# Patient Record
Sex: Male | Born: 1970 | Race: White | Hispanic: No | Marital: Married | Smoking: Former smoker
Health system: Southern US, Community
[De-identification: ages and names within clinical notes are randomized; demographics above are authoritative.]

## PROBLEM LIST (undated history)

## (undated) DIAGNOSIS — I10 Essential (primary) hypertension: Secondary | ICD-10-CM

## (undated) DIAGNOSIS — K219 Gastro-esophageal reflux disease without esophagitis: Secondary | ICD-10-CM

## (undated) DIAGNOSIS — Z9889 Other specified postprocedural states: Secondary | ICD-10-CM

## (undated) DIAGNOSIS — R7989 Other specified abnormal findings of blood chemistry: Secondary | ICD-10-CM

## (undated) DIAGNOSIS — T4145XA Adverse effect of unspecified anesthetic, initial encounter: Secondary | ICD-10-CM

## (undated) DIAGNOSIS — M1990 Unspecified osteoarthritis, unspecified site: Secondary | ICD-10-CM

## (undated) DIAGNOSIS — R945 Abnormal results of liver function studies: Secondary | ICD-10-CM

## (undated) DIAGNOSIS — N132 Hydronephrosis with renal and ureteral calculous obstruction: Secondary | ICD-10-CM

## (undated) DIAGNOSIS — G510 Bell's palsy: Secondary | ICD-10-CM

## (undated) DIAGNOSIS — J069 Acute upper respiratory infection, unspecified: Secondary | ICD-10-CM

## (undated) DIAGNOSIS — D496 Neoplasm of unspecified behavior of brain: Secondary | ICD-10-CM

## (undated) DIAGNOSIS — N201 Calculus of ureter: Secondary | ICD-10-CM

## (undated) DIAGNOSIS — N200 Calculus of kidney: Secondary | ICD-10-CM

## (undated) DIAGNOSIS — R112 Nausea with vomiting, unspecified: Secondary | ICD-10-CM

## (undated) HISTORY — PX: BRAIN TUMOR EXCISION: SHX577

## (undated) HISTORY — DX: Acute upper respiratory infection, unspecified: J06.9

## (undated) HISTORY — DX: Calculus of ureter: N20.1

## (undated) HISTORY — PX: FRACTURE SURGERY: SHX138

## (undated) HISTORY — DX: Hydronephrosis with renal and ureteral calculous obstruction: N13.2

## (undated) HISTORY — DX: Essential (primary) hypertension: I10

---
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
## 1996-10-22 HISTORY — PX: ELBOW FRACTURE SURGERY: SHX616

## 2006-10-22 DIAGNOSIS — T8859XA Other complications of anesthesia, initial encounter: Secondary | ICD-10-CM

## 2006-10-22 DIAGNOSIS — G510 Bell's palsy: Secondary | ICD-10-CM

## 2006-10-22 HISTORY — DX: Other complications of anesthesia, initial encounter: T88.59XA

## 2006-10-22 HISTORY — DX: Bell's palsy: G51.0

## 2006-10-22 HISTORY — PX: BRAIN TUMOR EXCISION: SHX577

## 2009-12-04 ENCOUNTER — Ambulatory Visit: Payer: Self-pay | Admitting: Family Medicine

## 2011-01-31 ENCOUNTER — Ambulatory Visit: Payer: Self-pay | Admitting: Family Medicine

## 2013-06-10 DIAGNOSIS — I10 Essential (primary) hypertension: Secondary | ICD-10-CM | POA: Insufficient documentation

## 2014-09-10 ENCOUNTER — Ambulatory Visit: Payer: Self-pay | Admitting: Family Medicine

## 2015-01-26 DIAGNOSIS — I10 Essential (primary) hypertension: Secondary | ICD-10-CM

## 2015-01-26 DIAGNOSIS — E785 Hyperlipidemia, unspecified: Secondary | ICD-10-CM | POA: Insufficient documentation

## 2015-01-26 HISTORY — DX: Essential (primary) hypertension: I10

## 2015-04-22 DIAGNOSIS — J069 Acute upper respiratory infection, unspecified: Secondary | ICD-10-CM | POA: Insufficient documentation

## 2015-04-22 HISTORY — DX: Acute upper respiratory infection, unspecified: J06.9

## 2015-06-20 ENCOUNTER — Emergency Department (HOSPITAL_COMMUNITY): Payer: 59

## 2015-06-20 ENCOUNTER — Emergency Department: Payer: 59

## 2015-06-20 ENCOUNTER — Encounter: Payer: Self-pay | Admitting: Emergency Medicine

## 2015-06-20 ENCOUNTER — Emergency Department
Admission: EM | Admit: 2015-06-20 | Discharge: 2015-06-20 | Disposition: A | Discharge status: Home / Self Care | Payer: 59 | Attending: Emergency Medicine | Admitting: Emergency Medicine

## 2015-06-20 DIAGNOSIS — Z720 Tobacco use: Secondary | ICD-10-CM | POA: Diagnosis not present

## 2015-06-20 DIAGNOSIS — I10 Essential (primary) hypertension: Secondary | ICD-10-CM | POA: Diagnosis not present

## 2015-06-20 DIAGNOSIS — R319 Hematuria, unspecified: Secondary | ICD-10-CM | POA: Diagnosis present

## 2015-06-20 DIAGNOSIS — R109 Unspecified abdominal pain: Secondary | ICD-10-CM

## 2015-06-20 DIAGNOSIS — N200 Calculus of kidney: Secondary | ICD-10-CM | POA: Insufficient documentation

## 2015-06-20 HISTORY — DX: Essential (primary) hypertension: I10

## 2015-06-20 HISTORY — DX: Neoplasm of unspecified behavior of brain: D49.6

## 2015-06-20 HISTORY — DX: Calculus of kidney: N20.0

## 2015-06-20 LAB — CBC WITH DIFFERENTIAL/PLATELET
Basophils Absolute: 0.1 10*3/uL (ref 0–0.1)
Basophils Relative: 1 %
EOS PCT: 3 %
Eosinophils Absolute: 0.3 10*3/uL (ref 0–0.7)
HCT: 41.9 % (ref 40.0–52.0)
Hemoglobin: 14 g/dL (ref 13.0–18.0)
LYMPHS ABS: 3.8 10*3/uL — AB (ref 1.0–3.6)
LYMPHS PCT: 38 %
MCH: 30.1 pg (ref 26.0–34.0)
MCHC: 33.4 g/dL (ref 32.0–36.0)
MCV: 90.1 fL (ref 80.0–100.0)
Monocytes Absolute: 0.7 10*3/uL (ref 0.2–1.0)
Monocytes Relative: 7 %
Neutro Abs: 5.2 10*3/uL (ref 1.4–6.5)
Neutrophils Relative %: 51 %
Platelets: 245 10*3/uL (ref 150–440)
RBC: 4.64 MIL/uL (ref 4.40–5.90)
RDW: 14.3 % (ref 11.5–14.5)
WBC: 10.1 10*3/uL (ref 3.8–10.6)

## 2015-06-20 LAB — BASIC METABOLIC PANEL
Anion gap: 7 (ref 5–15)
BUN: 16 mg/dL (ref 6–20)
CALCIUM: 9.2 mg/dL (ref 8.9–10.3)
CO2: 26 mmol/L (ref 22–32)
CREATININE: 0.92 mg/dL (ref 0.61–1.24)
Chloride: 105 mmol/L (ref 101–111)
GFR calc Af Amer: >60 mL/min (ref >60)
GLUCOSE: 90 mg/dL (ref 65–99)
Potassium: 4.1 mmol/L (ref 3.5–5.1)
Sodium: 138 mmol/L (ref 135–145)

## 2015-06-20 LAB — URINALYSIS COMPLETE WITH MICROSCOPIC (ARMC ONLY)
Bilirubin Urine: NEGATIVE
GLUCOSE, UA: NEGATIVE mg/dL
Nitrite: NEGATIVE
PROTEIN: 30 mg/dL — AB
Specific Gravity, Urine: 1.027 (ref 1.005–1.030)
Squamous Epithelial / LPF: NONE SEEN
pH: 6 (ref 5.0–8.0)

## 2015-06-20 MED ORDER — ONDANSETRON HCL 4 MG PO TABS: 4.0000 mg | ORAL_TABLET | Freq: Every day | ORAL | Status: DC | PRN

## 2015-06-20 MED ORDER — TAMSULOSIN HCL 0.4 MG PO CAPS: 0.4000 mg | ORAL_CAPSULE | Freq: Every day | ORAL | Status: DC

## 2015-06-20 MED ORDER — OXYCODONE-ACETAMINOPHEN 5-325 MG PO TABS: 1.0000 | ORAL_TABLET | ORAL | Status: DC | PRN

## 2015-06-20 NOTE — ED Provider Notes (Signed)
[facility] Provider Note   ____________________________________________  Time seen: 1425  I have reviewed the triage vital signs and the nursing notes.   HISTORY  Chief Complaint Hematuria and Flank Pain   History limited by: Not Limited   HPI Aaron Nichols is a 44 y.o. male who presents to the emergency department today because of concerns for left flank pain and bloody urine. The patient states that he first started having his flank pain last night. He describes it as being in the left flank. Some radiation into the back. He states that it is roughly 3 or 4 out of 10. He states that this morning he noticed blood in his urine. He states he does have a history of kidney stones. He has never required lithotripsy. Last kidney stone roughly 1 year ago. Denies any fevers, nausea or vomiting.     Past Medical History  Diagnosis Date  . Brain tumor   . Kidney stone   . Hypertension     There are no active problems to display for this patient.   Past Surgical History  Procedure Laterality Date  . Brain tumor excision      No current outpatient prescriptions on file.  Allergies Review of patient's allergies indicates no known allergies.  No family history on file.  Social History Social History  Substance Use Topics  . Smoking status: Current Every Day Smoker  . Smokeless tobacco: None  . Alcohol Use: No    Review of Systems  Constitutional: Negative for fever. Cardiovascular: Negative for chest pain. Respiratory: Negative for shortness of breath. Gastrointestinal: Positive for left-sided flank pain Genitourinary: Positive for hematuria Musculoskeletal: Negative for back pain. Skin: Negative for rash. Neurological: Negative for headaches, focal weakness or numbness.   10-point ROS otherwise negative.  ____________________________________________   PHYSICAL EXAM:  VITAL SIGNS: ED Triage Vitals  Enc  Vitals Group     BP 06/20/15 1253 155/80 mmHg     Pulse Rate 06/20/15 1253 79     Resp 06/20/15 1253 18     Temp 06/20/15 1253 98.2 F (36.8 C)     Temp Source 06/20/15 1253 Oral     SpO2 06/20/15 1253 93 %     Weight 06/20/15 1253 225 lb (102.059 kg)     Height 06/20/15 1253 5' 10.5" (1.791 m)     Head Cir --      Peak Flow --      Pain Score 06/20/15 1254 4   Constitutional: Alert and oriented. Well appearing and in no distress. Eyes: Conjunctivae are normal. PERRL. Normal extraocular movements. ENT   Head: Normocephalic and atraumatic.   Nose: No congestion/rhinnorhea.   Mouth/Throat: Mucous membranes are moist.   Neck: No stridor. Hematological/Lymphatic/Immunilogical: No cervical lymphadenopathy. Cardiovascular: Normal rate, regular rhythm.  No murmurs, rubs, or gallops. Respiratory: Normal respiratory effort without tachypnea nor retractions. Breath sounds are clear and equal bilaterally. No wheezes/rales/rhonchi. Gastrointestinal: Soft and nontender. No distention. There is no CVA tenderness. Genitourinary: Deferred Musculoskeletal: Normal range of motion in all extremities. No joint effusions.  No lower extremity tenderness nor edema. Neurologic:  Normal speech and language. No gross focal neurologic deficits are appreciated. Speech is normal.  Skin:  Skin is warm, dry and intact. No rash noted. Psychiatric: Mood and affect are normal. Speech and behavior are normal. Patient exhibits appropriate insight and judgment.  ____________________________________________    LABS (pertinent positives/negatives)  Labs Reviewed  URINALYSIS COMPLETEWITH MICROSCOPIC ([place] ONLY) - Abnormal;  Notable for the following:    Color, Urine AMBER (*)    APPearance HAZY (*)    Ketones, ur TRACE (*)    Hgb urine dipstick 3+ (*)    Protein, ur 30 (*)    Leukocytes, UA TRACE (*)    Bacteria, UA RARE (*)    All other components within normal limits  CBC WITH  DIFFERENTIAL/PLATELET - Abnormal; Notable for the following:    Lymphs Abs 3.8 (*)    All other components within normal limits  BASIC METABOLIC PANEL     ____________________________________________   EKG  None  ____________________________________________    RADIOLOGY  Renal ultrasound IMPRESSION: There is no hydronephrosis. Nonobstructing stone or stones are present in the lower pole of the left kidney. No acute bladder abnormality is observed. ____________________________________________   PROCEDURES  Procedure(s) performed: None  Critical Care performed: No  ____________________________________________   INITIAL IMPRESSION / ASSESSMENT AND PLAN / ED COURSE  Pertinent labs & imaging results that were available during my care of the patient were reviewed by me and considered in my medical decision making (see chart for details).  Patient presented to the emergency department today with concerns for left flank pain. Patient's urine with multiple red blood cells. Ultrasound within normal limits. Blood work without any concerning findings. Will treat for kidney stone.  ____________________________________________   FINAL CLINICAL IMPRESSION(S) / ED DIAGNOSES  Final diagnoses:  Kidney stone     Nance Pear, MD 06/20/15 [phone]

## 2015-06-20 NOTE — ED Notes (Signed)
Patient to ER for c/o hematuria that started this am with flank pain on left side. Has h/o kidney stones.

## 2015-06-20 NOTE — Discharge Instructions (Signed)
Please seek medical attention for any high fevers, chest pain, shortness of breath, change in behavior, persistent vomiting, bloody stool or any other new or concerning symptoms. ° ° °Kidney Stones °Kidney stones (urolithiasis) are deposits that form inside your kidneys. The intense pain is caused by the stone moving through the urinary tract. When the stone moves, the ureter goes into spasm around the stone. The stone is usually passed in the urine.  °CAUSES  °· A disorder that makes certain neck glands produce too much parathyroid hormone (primary hyperparathyroidism). °· A buildup of uric acid crystals, similar to gout in your joints. °· Narrowing (stricture) of the ureter. °· A kidney obstruction present at birth (congenital obstruction). °· Previous surgery on the kidney or ureters. °· Numerous kidney infections. °SYMPTOMS  °· Feeling sick to your stomach (nauseous). °· Throwing up (vomiting). °· Blood in the urine (hematuria). °· Pain that usually spreads (radiates) to the groin. °· Frequency or urgency of urination. °DIAGNOSIS  °· Taking a history and physical exam. °· Blood or urine tests. °· CT scan. °· Occasionally, an examination of the inside of the urinary bladder (cystoscopy) is performed. °TREATMENT  °· Observation. °· Increasing your fluid intake. °· Extracorporeal shock wave lithotripsy--This is a noninvasive procedure that uses shock waves to break up kidney stones. °· Surgery may be needed if you have severe pain or persistent obstruction. There are various surgical procedures. Most of the procedures are performed with the use of small instruments. Only small incisions are needed to accommodate these instruments, so recovery time is minimized. °The size, location, and chemical composition are all important variables that will determine the proper choice of action for you. Talk to your health care provider to better understand your situation so that you will minimize the risk of injury to yourself  and your kidney.  °HOME CARE INSTRUCTIONS  °· Drink enough water and fluids to keep your urine clear or pale yellow. This will help you to pass the stone or stone fragments. °· Strain all urine through the provided strainer. Keep all particulate matter and stones for your health care provider to see. The stone causing the pain may be as small as a grain of salt. It is very important to use the strainer each and every time you pass your urine. The collection of your stone will allow your health care provider to analyze it and verify that a stone has actually passed. The stone analysis will often identify what you can do to reduce the incidence of recurrences. °· Only take over-the-counter or prescription medicines for pain, discomfort, or fever as directed by your health care provider. °· Make a follow-up appointment with your health care provider as directed. °· Get follow-up X-rays if required. The absence of pain does not always mean that the stone has passed. It may have only stopped moving. If the urine remains completely obstructed, it can cause loss of kidney function or even complete destruction of the kidney. It is your responsibility to make sure X-rays and follow-ups are completed. Ultrasounds of the kidney can show blockages and the status of the kidney. Ultrasounds are not associated with any radiation and can be performed easily in a matter of minutes. °SEEK MEDICAL CARE IF: °· You experience pain that is progressive and unresponsive to any pain medicine you have been prescribed. °SEEK IMMEDIATE MEDICAL CARE IF:  °· Pain cannot be controlled with the prescribed medicine. °· You have a fever or shaking chills. °· The severity or intensity   of pain increases over 18 hours and is not relieved by pain medicine. °· You develop a new onset of abdominal pain. °· You feel faint or pass out. °· You are unable to urinate. °MAKE SURE YOU:  °· Understand these instructions. °· Will watch your condition. °· Will get  help right away if you are not doing well or get worse. °Document Released: 10/08/2005 Document Revised: 06/10/2013 Document Reviewed: 03/11/2013 °[employer]® Patient Information ©2015 [employer]. This information is not intended to replace advice given to you by your health care provider. Make sure you discuss any questions you have with your health care provider. ° °

## 2015-06-25 ENCOUNTER — Ambulatory Visit
Admission: EM | Admit: 2015-06-25 | Discharge: 2015-06-25 | Disposition: A | Discharge status: Home / Self Care | Payer: 59 | Attending: Internal Medicine | Admitting: Internal Medicine

## 2015-06-25 ENCOUNTER — Ambulatory Visit: Payer: 59

## 2015-06-25 DIAGNOSIS — N202 Calculus of kidney with calculus of ureter: Secondary | ICD-10-CM | POA: Diagnosis not present

## 2015-06-25 DIAGNOSIS — N134 Hydroureter: Secondary | ICD-10-CM | POA: Diagnosis not present

## 2015-06-25 DIAGNOSIS — N200 Calculus of kidney: Secondary | ICD-10-CM | POA: Diagnosis not present

## 2015-06-25 LAB — URINALYSIS COMPLETE WITH MICROSCOPIC (ARMC ONLY)
BACTERIA UA: NONE SEEN — AB
BILIRUBIN URINE: NEGATIVE
GLUCOSE, UA: NEGATIVE mg/dL
KETONES UR: NEGATIVE mg/dL
LEUKOCYTES UA: NEGATIVE
Nitrite: NEGATIVE
PH: 5 (ref 5.0–8.0)
Protein, ur: NEGATIVE mg/dL
SQUAMOUS EPITHELIAL / LPF: NONE SEEN — AB
Specific Gravity, Urine: <1.005 — ABNORMAL LOW (ref 1.005–1.030)

## 2015-06-25 MED ORDER — OXYCODONE-ACETAMINOPHEN 5-325 MG PO TABS: 2.0000 | ORAL_TABLET | ORAL | Status: DC | PRN

## 2015-06-25 NOTE — ED Notes (Signed)
Pt was seen on for back pain on Monday said he had kidney stones. Still with some issues. No current blood in urine.

## 2015-06-25 NOTE — ED Provider Notes (Signed)
CSN: [id number]     Arrival date & time 06/25/15  0902 History   First MD Initiated Contact with Patient 06/25/15 [phone]     Chief Complaint  Patient presents with  . Flank Pain   HPI  Patient is a 44 year old gentleman with history of kidney stones, has these on and off, usually the past in 24-36 hours. He presented to the [facility] ED on August 29, with left-sided flank pain and gross hematuria. Renal ultrasound demonstrated a 10 x 6 mm stone on the left, nonobstructive. Patient was given Flomax and #15 oxycodone, and has continued to have intermittent flank pain, usually worse in the middle the night. Pain is predominantly on the right, in the right flank, now. No fever. No tactile temp. Missing a lot of work. Pain seems to be worse with position changes, maybe a little worse after eating. Has been a little bit constipated, which he attributes to the pain medicine, but having daily bowel movements that are not hard or large. Doesn't feel like he is straining excessively. He works for [employer], has to spend some time on ladders, and tight crawl spaces, quite physical. Intermittent nausea, no vomiting. Having the urge to void/defecate frequently, doesn't always really have to go. No injury recalled, no unusual activities.    Past Medical History  Diagnosis Date  . Brain tumor   . Kidney stone   . Hypertension    Past Surgical History  Procedure Laterality Date  . Brain tumor excision     History reviewed. No pertinent family history. Social History  Substance Use Topics  . Smoking status: Current Every Day Smoker  . Smokeless tobacco: None  . Alcohol Use: No    Review of Systems  All other systems reviewed and are negative.   Allergies  Review of patient's allergies indicates no known allergies.  Home Medications   Prior to Admission medications   Medication Sig Start Date End Date Taking? Authorizing Provider  ondansetron (ZOFRAN) 4 MG tablet Take 1 tablet (4 mg total) by mouth  daily as needed for nausea or vomiting. 06/20/15 06/19/16  Nance Pear, MD  oxyCODONE-acetaminophen (PERCOCET/ROXICET) 5-325 MG per tablet Take 2 tablets by mouth every 4 (four) hours as needed for severe pain. 06/25/15   Sherlene Shams, MD  tamsulosin (FLOMAX) 0.4 MG CAPS capsule Take 1 capsule (0.4 mg total) by mouth daily. 06/20/15   Nance Pear, MD   Meds Ordered and Administered this Visit  Medications - No data to display  BP 154/84 mmHg  Pulse 77  Temp(Src) 97.4 F (36.3 C) (Tympanic)  Resp 20  Ht 5\' 10"  (1.778 m)  Wt 220 lb (99.791 kg)  BMI 31.57 kg/m2  SpO2 99% No data found.   Physical Exam  Constitutional: He is oriented to person, place, and time.  Alert, nicely groomed Pacing in the exam room. Able to climb on and off the exam table, but looks like it hurts to lie back, and to sit up again.  HENT:  Head: Atraumatic.  Eyes:  Conjugate gaze, no eye redness/drainage  Neck: Neck supple.  Cardiovascular: Regular rhythm.   On exam, heart rate about 110s  Pulmonary/Chest: No respiratory distress. He has no wheezes. He has no rales.  Lungs clear, symmetric breath sounds  Abdominal: Soft. There is no tenderness. There is no rebound.  Abdomen appears to be slightly distended, poorly relaxed, with possible mild guarding. No focal tenderness. 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  Musculoskeletal: Normal range of motion.  Neurological: He is alert and oriented to person, place, and time.  Skin: Skin is warm and dry.  Pink, no cyanosis  Nursing note and vitals reviewed.   ED Course  Procedures  Results for orders placed or performed during the hospital encounter of 06/25/15  Urinalysis complete, with microscopic  Result Value Ref Range   Color, Urine YELLOW YELLOW   APPearance CLEAR CLEAR    Glucose, UA NEGATIVE NEGATIVE mg/dL   Bilirubin Urine NEGATIVE NEGATIVE   Ketones, ur NEGATIVE NEGATIVE mg/dL   Specific Gravity, Urine <1.005 (L) 1.005 - 1.030   Hgb urine dipstick 3+ (A) NEGATIVE   pH 5.0 5.0 - 8.0   Protein, ur NEGATIVE NEGATIVE mg/dL   Nitrite NEGATIVE NEGATIVE   Leukocytes, UA NEGATIVE NEGATIVE   RBC / HPF 6-30 <3 RBC/hpf   WBC, UA 0-5 <3 WBC/hpf   Bacteria, UA NONE SEEN (A) RARE   Squamous Epithelial / LPF NONE SEEN (A) RARE    EXAM: CT ABDOMEN AND PELVIS WITHOUT CONTRAST  TECHNIQUE: Multidetector CT imaging of the abdomen and pelvis was performed following the standard protocol without IV contrast.  COMPARISON: Ultrasound 06/20/2015  FINDINGS: Lower chest: Lung bases are clear.  Hepatobiliary: No focal hepatic lesion. Several gallstones within non dilated gallbladder.  Pancreas: Pancreas is normal. No ductal dilatation. No pancreatic inflammation.  Spleen: Normal spleen  Adrenals/urinary tract: Adrenal glands are normal.  There is hydronephrosis hydroureter of the RIGHT kidney secondary to obstructing calculus in the distal RIGHT ureter measuring 3 mm on image 67, series 2. This calculus is at the S2 vertebral body level approximately 7 cm from the vesicoureteral junction.  There is nonobstructing calculus lower pole of LEFT kidney measuring 7 mm. No LEFT ureterolithiasis. No bladder calculi  Stomach/Bowel: Stomach, small bowel, appendix, and cecum are normal. The colon and rectosigmoid colon are normal.  Vascular/Lymphatic: Abdominal aorta is normal caliber with atherosclerotic calcification. There is no retroperitoneal or periportal lymphadenopathy. No pelvic lymphadenopathy.  Reproductive: Prostate normal.  Musculoskeletal: No aggressive osseous lesion.  Other: No free fluid.  IMPRESSION: 1. Small obstructing calculus within the distal RIGHT ureter. 2. Nonobstructing LEFT renal calculus. 3. Atherosclerotic  calcification of the abdominal aorta.   Electronically Signed  By: Suzy Bouchard M.D.  On: 06/25/2015 11:26  MDM   1. Right nephrolithiasis   2. Hydroureter, right    rx percocet #15; Followup urology. Note for work until cleared by urology to return. Continue flomax.  Push fluid.       Sherlene Shams, MD 06/25/15 [phone]

## 2015-07-01 ENCOUNTER — Ambulatory Visit (INDEPENDENT_AMBULATORY_CARE_PROVIDER_SITE_OTHER): Payer: 59 | Admitting: Urology

## 2015-07-01 ENCOUNTER — Encounter: Payer: Self-pay | Admitting: Urology

## 2015-07-01 VITALS — BP 134/82 | HR 74 | Resp 16 | Ht 70.0 in | Wt 222.9 lb

## 2015-07-01 DIAGNOSIS — N132 Hydronephrosis with renal and ureteral calculous obstruction: Secondary | ICD-10-CM

## 2015-07-01 DIAGNOSIS — N201 Calculus of ureter: Secondary | ICD-10-CM

## 2015-07-01 DIAGNOSIS — N200 Calculus of kidney: Secondary | ICD-10-CM | POA: Diagnosis not present

## 2015-07-01 MED ORDER — TAMSULOSIN HCL 0.4 MG PO CAPS: 0.4000 mg | ORAL_CAPSULE | Freq: Every day | ORAL | Status: DC

## 2015-07-01 MED ORDER — OXYCODONE-ACETAMINOPHEN 10-325 MG PO TABS
1.0000 | ORAL_TABLET | ORAL | Status: DC | PRN
Start: 2015-07-01 — End: 2015-07-12

## 2015-07-01 NOTE — Progress Notes (Signed)
07/01/2015 9:23 PM   Aaron Nichols [birth year] [id number]  Referring provider: Richrd Humbles, MD [address] [facility] [place], [place] [address]  Chief Complaint  Patient presents with  . Nephrolithiasis    right side ER 06/25/15    HPI: Patient is a 44 year old white male who was seen at [facility] ED on 06/25/2015 for intense right-sided flank pain. A renal protocol CT scan was performed and a 3 mm distal right ureteral calculi associated with hydronephrosis and hydroureter of ureter was noted.  He presents today for further evaluation and management. He does not believe he has passed the stone.  Today, he is experiencing frequent urination, dysuria, intermittency and straining to urinate. He had gross hematuria when the right flank pain first occurred approximately 5 days ago. He states yesterday he was still experiencing some right-sided flank pain, although it was not as intense as it was before. He is also experiencing some left-sided flank pain.  He has a prior history of nephrolithiasis. He states he first started experiencing stones when he was 44 years of age. He had been seen and evaluated by Dr. Kandee Keen at [facility]. His stone composition is unknown at this time. He has not had a recurrence with nephrolithiasis for 4 years.  He is not experiencing fever or chills. He has had bouts of nausea and vomiting with this current ureteral stone.   PMH: Past Medical History  Diagnosis Date  . Brain tumor   . Kidney stone   . Hypertension     Surgical History: Past Surgical History  Procedure Laterality Date  . Brain tumor excision      Home Medications:    Medication List       This list is accurate as of: 07/01/15 11:59 PM.  Always use your most recent med list.               lisinopril 20 MG tablet  Commonly known as:  PRINIVIL,ZESTRIL  Take 20 mg by mouth daily.     multivitamin with minerals tablet  Take 1 tablet by mouth daily.     ondansetron 4 MG tablet  Commonly known as:  ZOFRAN  Take 1 tablet (4 mg total) by mouth daily as needed for nausea or vomiting.     oxyCODONE-acetaminophen 10-325 MG per tablet  Commonly known as:  PERCOCET  Take 1 tablet by mouth every 4 (four) hours as needed for pain.     RA NAPROXEN SODIUM 220 MG tablet  Generic drug:  naproxen sodium  Take by mouth as needed.     tamsulosin 0.4 MG Caps capsule  Commonly known as:  FLOMAX  Take 1 capsule (0.4 mg total) by mouth daily.        Allergies: No Known Allergies  Family History: Family History  Problem Relation Age of Onset  . Heart disease Father     Social History:  reports that he has been smoking.  He does not have any smokeless tobacco history on file. He reports that he does not drink alcohol. His drug history is not on file.  ROS: UROLOGY Frequent Urination?: Yes Hard to postpone urination?: No Burning/pain with urination?: Yes Get up at night to urinate?: No Leakage of urine?: No Urine stream starts and stops?: Yes Trouble starting stream?: No Do you have to strain to urinate?: Yes Blood in urine?: Yes Urinary tract infection?: No Sexually transmitted disease?: No Injury to kidneys or bladder?: No Painful intercourse?: No Weak stream?:  No Erection problems?: No Penile pain?: No  Gastrointestinal Nausea?: Yes Vomiting?: Yes Indigestion/heartburn?: No Diarrhea?: No Constipation?: No  Constitutional Fever: No Night sweats?: No Weight loss?: No Fatigue?: No  Skin Skin rash/lesions?: No Itching?: No  Eyes Blurred vision?: Yes Double vision?: No  Ears/Nose/Throat Sore throat?: No Sinus problems?: No  Hematologic/Lymphatic Swollen glands?: No Easy bruising?: No  Cardiovascular Leg swelling?: No Chest pain?: No  Respiratory Cough?: No Shortness of breath?: No  Endocrine Excessive thirst?: No  Musculoskeletal Back pain?: Yes Joint pain?: No  Neurological Headaches?:  No Dizziness?: No  Psychologic Depression?: No Anxiety?: No  Physical Exam: BP 134/82 mmHg  Pulse 74  Resp 16  Ht 5\' 10"  (1.778 m)  Wt 222 lb 14.4 oz (101.107 kg)  BMI 31.98 kg/m2  Constitutional:  Alert and oriented, No acute distress. HEENT: [place] AT, moist mucus membranes.  Trachea midline, no masses. Cardiovascular: No clubbing, cyanosis, or edema. Respiratory: Normal respiratory effort, no increased work of breathing. GI: Abdomen is soft, nontender, nondistended, no abdominal masses GU: No CVA tenderness. GU: Patient with circumcised phallus.  Urethral meatus is patent.  No penile discharge. No penile lesions or rashes. Scrotum without lesions, cysts, rashes and/or edema.  Testicles are located scrotally bilaterally. No masses are appreciated in the testicles. Left and right epididymis are normal. Rectal: Patient with  normal sphincter tone. Perineum without scarring or rashes. No rectal masses are appreciated. Prostate is approximately 30 grams, no nodules are appreciated. Seminal vesicles are normal. Skin: No rashes, bruises or suspicious lesions. Lymph: No cervical or inguinal adenopathy. Neurologic: Grossly intact, no focal deficits, moving all 4 extremities. Psychiatric: Normal mood and affect.  Laboratory Data: Lab Results  Component Value Date   WBC 10.1 06/20/2015   HGB 14.0 06/20/2015   HCT 41.9 06/20/2015   MCV 90.1 06/20/2015   PLT 245 06/20/2015    Lab Results  Component Value Date   CREATININE 0.92 06/20/2015   Urinalysis    Component Value Date/Time   COLORURINE YELLOW 06/25/2015 0921   APPEARANCEUR CLEAR 06/25/2015 0921   LABSPEC <1.005* 06/25/2015 0921   PHURINE 5.0 06/25/2015 0921   GLUCOSEU NEGATIVE 06/25/2015 0921   HGBUR 3+* 06/25/2015 0921   BILIRUBINUR NEGATIVE 06/25/2015 0921   KETONESUR NEGATIVE 06/25/2015 0921   PROTEINUR NEGATIVE 06/25/2015 0921   NITRITE NEGATIVE 06/25/2015 0921   LEUKOCYTESUR NEGATIVE 06/25/2015 0921    Pertinent  Imaging: CLINICAL DATA: Right-sided abdominal pain and hematuria.  EXAM: CT ABDOMEN AND PELVIS WITHOUT CONTRAST  TECHNIQUE: Multidetector CT imaging of the abdomen and pelvis was performed following the standard protocol without IV contrast.  COMPARISON: Ultrasound 06/20/2015  FINDINGS: Lower chest: Lung bases are clear.  Hepatobiliary: No focal hepatic lesion. Several gallstones within non dilated gallbladder.  Pancreas: Pancreas is normal. No ductal dilatation. No pancreatic inflammation.  Spleen: Normal spleen  Adrenals/urinary tract: Adrenal glands are normal.  There is hydronephrosis hydroureter of the RIGHT kidney secondary to obstructing calculus in the distal RIGHT ureter measuring 3 mm on image 67, series 2. This calculus is at the S2 vertebral body level approximately 7 cm from the vesicoureteral junction.  There is nonobstructing calculus lower pole of LEFT kidney measuring 7 mm. No LEFT ureterolithiasis. No bladder calculi  Stomach/Bowel: Stomach, small bowel, appendix, and cecum are normal. The colon and rectosigmoid colon are normal.  Vascular/Lymphatic: Abdominal aorta is normal caliber with atherosclerotic calcification. There is no retroperitoneal or periportal lymphadenopathy. No pelvic lymphadenopathy.  Reproductive: Prostate normal.  Musculoskeletal: No aggressive  osseous lesion.  Other: No free fluid.  IMPRESSION: 1. Small obstructing calculus within the distal RIGHT ureter. 2. Nonobstructing LEFT renal calculus. 3. Atherosclerotic calcification of the abdominal aorta.   Electronically Signed  By: Suzy Bouchard M.D.  On: 06/25/2015 11:26        Assessment & Plan:    1. Right ureteral stone:   Patient does not feel he has passed stone. He is given a calculus strainer and instructed to strain his urine. He is also given a prescription for pain medication (Percocet 10/325 #30) and his tamsulosin is refilled.  He stated he did not need a refill on his nausea medication. He will continue to strain his urine and if he has not passed his stone by Monday he will be scheduled for right URS/LL/right ureteral stent placement.  I explained to the patient how the procedure is performed and the risks involved.    I informed patient that she will have a stent placed during the procedure and will remain in place after the procedure for a short time.  It will be removed in the office with a cystoscope, unless a string in left in place.  I informed that patient that about 50% of patients who undergo ureteroscopy and have a stent will have "stent pain," and this is by far the most common risk/complaint following ureteroscopy. A stent is a soft plastic tube (about half the size of IV tubing) that allows the kidney to drain to the bladder regardless of edema or obstruction. Not only can the stent "rub" on the inside of the bladder, causing a feeling of needing to urinate/overactive bladder, but also the stent allows urine to pass up from the bladder to the kidney during urination - causing symptoms from a warm, tingling sensation to intense pain in the affected flank.   They may be residual stones within the kidney or ureter may be present up to 40% of the time following ureteroscopy, depending on the original stone size and location. These stone fragments will be seen and addressed on follow-up imaging.  Injury to the ureter is the most common intra-operative complication during ureteroscopy. The reported risk of perforation ranges greatly, depending on whether it is defined as a complete perforation (0.1-0.7% - think of this as a hole through the entire ureter), a partial perforation (1.6% - a hole nearly through the entire ureter), or mucosal tear/scrape (5% - these are similar to a sore on the inside of the mouth). Almost 100% of these will heal with prolonged stenting (anywhere between 2 - 4 weeks). Should a large perforation  occur, your urologist may chose to stop the procedure and return on another day when the ureter has had time to heal.  2. Hydronephrosis:   Patient was found to have hydronephrosis and hydroureter on CT scan due to a right distal calculus. Once he has received definitive treatment for this stone, we'll obtain a renal ultrasound in 1 month to ensure the hydronephrosis has resolved.  3. Left renal stone:   Patient would also like his left stone addressed once he received definitive treatment for his right ureteral stone.    There are no diagnoses linked to this encounter.  Return for patient will call Monday am and let us know if he has passed the stone.  Zara Council, [facility] [address] [place], [place] [address] [phone]

## 2015-07-03 DIAGNOSIS — N201 Calculus of ureter: Secondary | ICD-10-CM | POA: Insufficient documentation

## 2015-07-03 DIAGNOSIS — N132 Hydronephrosis with renal and ureteral calculous obstruction: Secondary | ICD-10-CM

## 2015-07-03 DIAGNOSIS — N200 Calculus of kidney: Secondary | ICD-10-CM | POA: Insufficient documentation

## 2015-07-03 HISTORY — DX: Calculus of ureter: N20.1

## 2015-07-03 HISTORY — DX: Hydronephrosis with renal and ureteral calculous obstruction: N13.2

## 2015-07-04 ENCOUNTER — Ambulatory Visit
Admission: RE | Admit: 2015-07-04 | Discharge: 2015-07-04 | Disposition: A | Discharge status: Home / Self Care | Payer: 59 | Source: Ambulatory Visit | Attending: Urology | Admitting: Urology

## 2015-07-04 ENCOUNTER — Telehealth: Payer: Self-pay | Admitting: Urology

## 2015-07-04 ENCOUNTER — Other Ambulatory Visit: Payer: Self-pay | Admitting: Urology

## 2015-07-04 DIAGNOSIS — N1330 Unspecified hydronephrosis: Secondary | ICD-10-CM | POA: Diagnosis not present

## 2015-07-04 DIAGNOSIS — R109 Unspecified abdominal pain: Secondary | ICD-10-CM

## 2015-07-04 DIAGNOSIS — N200 Calculus of kidney: Secondary | ICD-10-CM | POA: Insufficient documentation

## 2015-07-04 NOTE — Telephone Encounter (Signed)
Patient called this am and stated he had not passed the stone.  He was having left sided flank pain as well as right sided pain.  I obtained a RUS today which demonstrated that the left renal stone was still in the left renal unit without obstruction.  He is now having pain in his right groin with gross hematuria.  He may pass the right ureteral stone soon.  He will contact us if he passes it before Wednesday.  Otherwise, we will proceed with the right URS/LL/right stent placement.

## 2015-07-05 ENCOUNTER — Encounter
Admission: RE | Admit: 2015-07-05 | Discharge: 2015-07-05 | Disposition: A | Discharge status: Home / Self Care | Payer: 59 | Source: Ambulatory Visit | Attending: Urology | Admitting: Urology

## 2015-07-05 DIAGNOSIS — Z79891 Long term (current) use of opiate analgesic: Secondary | ICD-10-CM | POA: Diagnosis not present

## 2015-07-05 DIAGNOSIS — N135 Crossing vessel and stricture of ureter without hydronephrosis: Secondary | ICD-10-CM | POA: Diagnosis not present

## 2015-07-05 DIAGNOSIS — I700 Atherosclerosis of aorta: Secondary | ICD-10-CM | POA: Diagnosis not present

## 2015-07-05 DIAGNOSIS — N132 Hydronephrosis with renal and ureteral calculous obstruction: Secondary | ICD-10-CM | POA: Diagnosis not present

## 2015-07-05 DIAGNOSIS — Z79899 Other long term (current) drug therapy: Secondary | ICD-10-CM | POA: Diagnosis not present

## 2015-07-05 DIAGNOSIS — R29810 Facial weakness: Secondary | ICD-10-CM | POA: Diagnosis not present

## 2015-07-05 DIAGNOSIS — K219 Gastro-esophageal reflux disease without esophagitis: Secondary | ICD-10-CM | POA: Diagnosis not present

## 2015-07-05 DIAGNOSIS — I10 Essential (primary) hypertension: Secondary | ICD-10-CM | POA: Diagnosis not present

## 2015-07-05 DIAGNOSIS — F17210 Nicotine dependence, cigarettes, uncomplicated: Secondary | ICD-10-CM | POA: Diagnosis not present

## 2015-07-05 DIAGNOSIS — Z86011 Personal history of benign neoplasm of the brain: Secondary | ICD-10-CM | POA: Diagnosis not present

## 2015-07-05 HISTORY — DX: Other specified postprocedural states: Z98.890

## 2015-07-05 HISTORY — DX: Adverse effect of unspecified anesthetic, initial encounter: T41.45XA

## 2015-07-05 HISTORY — DX: Other specified postprocedural states: R11.2

## 2015-07-05 NOTE — Pre-Procedure Instructions (Signed)
Spoke with Clarise Cruz at Dr. Guinevere Ferrari office, we can use the labs drawn in ED on 06/20/15 for tomorrow's surgery.

## 2015-07-05 NOTE — Patient Instructions (Signed)
  Your procedure is scheduled on Wednesday Sept. 14 at 1:00 pm. Report to Same Day Surgery.  Remember: Instructions that are not followed completely may result in serious medical risk, up to and including death, or upon the discretion of your surgeon and anesthesiologist your surgery may need to be rescheduled.    __x__ 1. Do not eat food or drink liquids after midnight. No gum chewing or hard candies.     ____ 2. No Alcohol for 24 hours before or after surgery.   ____ 3. Bring all medications with you on the day of surgery if instructed.    __x__ 4. Notify your doctor if there is any change in your medical condition     (cold, fever, infections).     Do not wear jewelry, make-up, hairpins, clips or nail polish.  Do not wear lotions, powders, or perfumes. You may wear deodorant.  Do not shave 48 hours prior to surgery. Men may shave face and neck.  Do not bring valuables to the hospital.    [facility] is not responsible for any belongings or valuables.               Contacts, dentures or bridgework may not be worn into surgery.  Leave your suitcase in the car. After surgery it may be brought to your room.  For patients admitted to the hospital, discharge time is determined by your treatment team.   Patients discharged the day of surgery will not be allowed to drive home.    Please read over the following fact sheets that you were given:   [facility] Preparing for Surgery  _x___ Take these medicines the morning of surgery with A SIP OF WATER:    1. famotidine (PEPCID)  2. lisinopril (PRINIVIL,ZESTRIL)   ____ Fleet Enema (as directed)   ____ Use CHG Soap as directed  ____ Use inhalers on the day of surgery  ____ Stop metformin 2 days prior to surgery    ____ Take 1/2 of usual insulin dose the night before surgery and none on the morning of surgery.   ____ Stop Coumadin/Plavix/aspirin on does not apply.  _x___ Stopped Naproxen on Sept 9, 2016.  Tylenol or Percocet can be  taken for pain.   ____ Stop supplements until after surgery.    ____ Bring C-Pap to the hospital.

## 2015-07-06 ENCOUNTER — Encounter: Payer: Self-pay | Admitting: *Deleted

## 2015-07-06 ENCOUNTER — Ambulatory Visit: Payer: 59 | Admitting: Anesthesiology

## 2015-07-06 ENCOUNTER — Ambulatory Visit
Admission: RE | Admit: 2015-07-06 | Discharge: 2015-07-06 | Disposition: A | Discharge status: Home / Self Care | Payer: 59 | Source: Ambulatory Visit | Attending: Urology | Admitting: Urology

## 2015-07-06 ENCOUNTER — Encounter
Admission: RE | Disposition: A | Discharge status: Home / Self Care | Payer: Self-pay | Source: Ambulatory Visit | Attending: Urology

## 2015-07-06 DIAGNOSIS — N132 Hydronephrosis with renal and ureteral calculous obstruction: Secondary | ICD-10-CM | POA: Insufficient documentation

## 2015-07-06 DIAGNOSIS — I700 Atherosclerosis of aorta: Secondary | ICD-10-CM | POA: Insufficient documentation

## 2015-07-06 DIAGNOSIS — Z79899 Other long term (current) drug therapy: Secondary | ICD-10-CM | POA: Insufficient documentation

## 2015-07-06 DIAGNOSIS — K219 Gastro-esophageal reflux disease without esophagitis: Secondary | ICD-10-CM | POA: Insufficient documentation

## 2015-07-06 DIAGNOSIS — R29810 Facial weakness: Secondary | ICD-10-CM | POA: Insufficient documentation

## 2015-07-06 DIAGNOSIS — Z86011 Personal history of benign neoplasm of the brain: Secondary | ICD-10-CM | POA: Insufficient documentation

## 2015-07-06 DIAGNOSIS — I10 Essential (primary) hypertension: Secondary | ICD-10-CM | POA: Insufficient documentation

## 2015-07-06 DIAGNOSIS — F17210 Nicotine dependence, cigarettes, uncomplicated: Secondary | ICD-10-CM | POA: Insufficient documentation

## 2015-07-06 DIAGNOSIS — Z79891 Long term (current) use of opiate analgesic: Secondary | ICD-10-CM | POA: Insufficient documentation

## 2015-07-06 DIAGNOSIS — N135 Crossing vessel and stricture of ureter without hydronephrosis: Secondary | ICD-10-CM | POA: Insufficient documentation

## 2015-07-06 HISTORY — PX: URETEROSCOPY WITH HOLMIUM LASER LITHOTRIPSY: SHX6645

## 2015-07-06 HISTORY — PX: CYSTOSCOPY WITH STENT PLACEMENT: SHX5790

## 2015-07-06 SURGERY — URETEROSCOPY, WITH LITHOTRIPSY USING HOLMIUM LASER
Anesthesia: General | Laterality: Right | Wound class: Clean Contaminated

## 2015-07-06 MED ORDER — SUCCINYLCHOLINE CHLORIDE 20 MG/ML IJ SOLN
INTRAMUSCULAR | Status: DC | PRN
  Administered 2015-07-06: 120 mg via INTRAVENOUS

## 2015-07-06 MED ORDER — CEFUROXIME AXETIL 250 MG PO TABS: 250.0000 mg | ORAL_TABLET | Freq: Two times a day (BID) | ORAL | Status: DC

## 2015-07-06 MED ORDER — ONDANSETRON HCL 4 MG/2ML IJ SOLN: 4.0000 mg | Freq: Once | INTRAMUSCULAR | Status: DC | PRN

## 2015-07-06 MED ORDER — CEFAZOLIN SODIUM 1-5 GM-% IV SOLN
1.0000 g | Freq: Once | INTRAVENOUS | Status: AC
  Administered 2015-07-06: 1 g via INTRAVENOUS

## 2015-07-06 MED ORDER — OXYCODONE-ACETAMINOPHEN 5-325 MG PO TABS
ORAL_TABLET | ORAL | Status: AC
  Filled 2015-07-06: qty 1

## 2015-07-06 MED ORDER — ONDANSETRON HCL 4 MG/2ML IJ SOLN
INTRAMUSCULAR | Status: DC
Start: 2015-07-06 — End: 2015-07-06
  Filled 2015-07-06: qty 2

## 2015-07-06 MED ORDER — OXYCODONE-ACETAMINOPHEN 5-325 MG PO TABS
1.0000 | ORAL_TABLET | Freq: Four times a day (QID) | ORAL | Status: DC | PRN
  Administered 2015-07-06: 1 via ORAL

## 2015-07-06 MED ORDER — ROCURONIUM BROMIDE 100 MG/10ML IV SOLN
INTRAVENOUS | Status: DC | PRN
  Administered 2015-07-06: 10 mg via INTRAVENOUS
  Administered 2015-07-06: 20 mg via INTRAVENOUS
  Administered 2015-07-06: 10 mg via INTRAVENOUS

## 2015-07-06 MED ORDER — BELLADONNA ALKALOIDS-OPIUM 16.2-60 MG RE SUPP
RECTAL | Status: DC | PRN
  Administered 2015-07-06: 1 via RECTAL

## 2015-07-06 MED ORDER — OXYCODONE-ACETAMINOPHEN 5-325 MG PO TABS: 1.0000 | ORAL_TABLET | Freq: Four times a day (QID) | ORAL | Status: DC | PRN

## 2015-07-06 MED ORDER — SODIUM CHLORIDE 0.9 % IR SOLN
Status: DC | PRN
  Administered 2015-07-06: 300 mL

## 2015-07-06 MED ORDER — BUPIVACAINE HCL (PF) 0.5 % IJ SOLN
INTRAMUSCULAR | Status: AC
  Filled 2015-07-06: qty 30

## 2015-07-06 MED ORDER — LIDOCAINE HCL (CARDIAC) 20 MG/ML IV SOLN
INTRAVENOUS | Status: DC | PRN
Start: 2015-07-06 — End: 2015-07-06
  Administered 2015-07-06: 100 mg via INTRAVENOUS

## 2015-07-06 MED ORDER — LACTATED RINGERS IV SOLN
INTRAVENOUS | Status: DC
  Administered 2015-07-06 (×2): via INTRAVENOUS

## 2015-07-06 MED ORDER — SUGAMMADEX SODIUM 500 MG/5ML IV SOLN
INTRAVENOUS | Status: DC | PRN
  Administered 2015-07-06: 201.4 mg via INTRAVENOUS

## 2015-07-06 MED ORDER — ONDANSETRON HCL 4 MG/2ML IJ SOLN
INTRAMUSCULAR | Status: DC | PRN
  Administered 2015-07-06: 4 mg via INTRAVENOUS

## 2015-07-06 MED ORDER — ACETAMINOPHEN 10 MG/ML IV SOLN
INTRAVENOUS | Status: AC
  Filled 2015-07-06: qty 100

## 2015-07-06 MED ORDER — BUPIVACAINE HCL 0.5 % IJ SOLN
INTRAMUSCULAR | Status: DC | PRN
  Administered 2015-07-06: 30 mL

## 2015-07-06 MED ORDER — PROPOFOL 10 MG/ML IV BOLUS
INTRAVENOUS | Status: DC | PRN
  Administered 2015-07-06: 200 mg via INTRAVENOUS

## 2015-07-06 MED ORDER — FENTANYL CITRATE (PF) 100 MCG/2ML IJ SOLN
INTRAMUSCULAR | Status: AC
  Administered 2015-07-06: 25 ug via INTRAVENOUS
  Filled 2015-07-06: qty 2

## 2015-07-06 MED ORDER — BELLADONNA ALKALOIDS-OPIUM 16.2-60 MG RE SUPP
RECTAL | Status: AC
  Filled 2015-07-06: qty 1

## 2015-07-06 MED ORDER — ACETAMINOPHEN 10 MG/ML IV SOLN
INTRAVENOUS | Status: DC | PRN
  Administered 2015-07-06: 1000 mg via INTRAVENOUS

## 2015-07-06 MED ORDER — FENTANYL CITRATE (PF) 250 MCG/5ML IJ SOLN
INTRAMUSCULAR | Status: DC | PRN
  Administered 2015-07-06 (×2): 100 ug via INTRAVENOUS
  Administered 2015-07-06: 50 ug via INTRAVENOUS

## 2015-07-06 MED ORDER — FENTANYL CITRATE (PF) 100 MCG/2ML IJ SOLN
25.0000 ug | INTRAMUSCULAR | Status: DC | PRN
  Administered 2015-07-06 (×4): 25 ug via INTRAVENOUS

## 2015-07-06 SURGICAL SUPPLY — 27 items
BAG DRAIN CYSTO-URO LG1000N (MISCELLANEOUS) ×2 IMPLANT
CATH URETL 5X70 OPEN END (CATHETERS) ×2 IMPLANT
CNTNR SPEC 2.5X3XGRAD LEK (MISCELLANEOUS) ×1
CONRAY 43 FOR UROLOGY 50M (MISCELLANEOUS) ×2 IMPLANT
CONT SPEC 4OZ STER OR WHT (MISCELLANEOUS) ×1
CONTAINER SPEC 2.5X3XGRAD LEK (MISCELLANEOUS) ×1 IMPLANT
GLOVE BIO SURGEON STRL SZ7 (GLOVE) ×4 IMPLANT
GLOVE BIO SURGEON STRL SZ7.5 (GLOVE) ×2 IMPLANT
GOWN STRL REUS W/ TWL LRG LVL3 (GOWN DISPOSABLE) ×1 IMPLANT
GOWN STRL REUS W/ TWL XL LVL3 (GOWN DISPOSABLE) ×1 IMPLANT
GOWN STRL REUS W/TWL LRG LVL3 (GOWN DISPOSABLE) ×1
GOWN STRL REUS W/TWL XL LVL3 (GOWN DISPOSABLE) ×1
GUIDEWIRE STR ZIPWIRE 035X150 (MISCELLANEOUS) ×2 IMPLANT
INTRODUCER DILATOR DOUBLE (INTRODUCER) ×2 IMPLANT
KIT RM TURNOVER CYSTO AR (KITS) ×2 IMPLANT
PACK CYSTO AR (MISCELLANEOUS) ×2 IMPLANT
PREP PVP WINGED SPONGE (MISCELLANEOUS) ×2 IMPLANT
SENSORWIRE 0.038 NOT ANGLED (WIRE) ×2
SET CYSTO W/LG BORE CLAMP LF (SET/KITS/TRAYS/PACK) ×2 IMPLANT
SHEATH URETERAL 13/15X36 1L (SHEATH) IMPLANT
SOL .9 NS 3000ML IRR  AL (IV SOLUTION) ×1
SOL .9 NS 3000ML IRR UROMATIC (IV SOLUTION) ×1 IMPLANT
SOL PREP PVP 2OZ (MISCELLANEOUS) ×2
SOLUTION PREP PVP 2OZ (MISCELLANEOUS) ×1 IMPLANT
SURGILUBE 2OZ TUBE FLIPTOP (MISCELLANEOUS) ×2 IMPLANT
WATER STERILE IRR 1000ML POUR (IV SOLUTION) ×2 IMPLANT
WIRE SENSOR 0.038 NOT ANGLED (WIRE) ×1 IMPLANT

## 2015-07-06 NOTE — Anesthesia Preprocedure Evaluation (Signed)
Anesthesia Evaluation  Patient identified by MRN, date of birth, ID band Patient awake    Reviewed: Allergy & Precautions, NPO status , Patient's Chart, lab work & pertinent test results  History of Anesthesia Complications (+) PONV and history of anesthetic complications (with 16 hr surgery)  Airway Mallampati: II  TM Distance: >3 FB Neck ROM: Full    Dental  (+) Teeth Intact   Pulmonary Current Smoker (1 1/2 ppd),           Cardiovascular hypertension, Pt. on medications      Neuro/Psych    GI/Hepatic GERD  Medicated,  Endo/Other    Renal/GU      Musculoskeletal   Abdominal   Peds  Hematology   Anesthesia Other Findings Pt s/p acustic neuroma resection. R facial droop. R. Eyelid with gold weight to allow him to close it  Reproductive/Obstetrics                             Anesthesia Physical Anesthesia Plan  ASA: II  Anesthesia Plan: General   Post-op Pain Management:    Induction: Intravenous  Airway Management Planned:   Additional Equipment:   Intra-op Plan:   Post-operative Plan:   Informed Consent: I have reviewed the patients History and Physical, chart, labs and discussed the procedure including the risks, benefits and alternatives for the proposed anesthesia with the patient or authorized representative who has indicated his/her understanding and acceptance.     Plan Discussed with:   Anesthesia Plan Comments:         Anesthesia Quick Evaluation

## 2015-07-06 NOTE — Anesthesia Procedure Notes (Signed)
Procedure Name: Intubation Date/Time: 07/06/2015 2:29 PM Performed by: Jonna Clark Pre-anesthesia Checklist: Patient identified, Patient being monitored, Timeout performed, Emergency Drugs available and Suction available Patient Re-evaluated:Patient Re-evaluated prior to inductionOxygen Delivery Method: Circle system utilized Preoxygenation: Pre-oxygenation with 100% oxygen Intubation Type: IV induction Ventilation: Mask ventilation without difficulty Laryngoscope Size: Mac and 3 Grade View: Grade I Tube type: Oral Tube size: 7.5 mm Number of attempts: 1 Airway Equipment and Method: Stylet Placement Confirmation: ETT inserted through vocal cords under direct vision,  positive ETCO2 and breath sounds checked- equal and bilateral Secured at: 21 cm Tube secured with: Tape Dental Injury: Teeth and Oropharynx as per pre-operative assessment

## 2015-07-06 NOTE — Op Note (Addendum)
Date of procedure: 07/06/2015  Preoperative diagnosis:  1. Right ureteral calculus  Postoperative diagnosis:  1. Same with severe distal ureteral stricture  Procedure: 1. Cystoscopy with right retrograde attempted ureteroscopy, ureteral dilatation, stent placement  Surgeon: Collier Flowers D.O.  Anesthesia: General  Complications: None  Intraoperative findings: Severe stricture disease distal ureter on the right normal bladder normal urethra  EBL: 0  Specimens: None  Drains: 6 French 26 cm stent  Indication: Aaron Nichols is a 44 y.o. patient with mid right ureteral calculus.  After reviewing the management options for treatment, he elected to proceed with the above surgical procedure(s). We have discussed the potential benefits and risks of the procedure, side effects of the proposed treatment, the likelihood of the patient achieving the goals of the procedure, and any potential problems that might occur during the procedure or recuperation. Informed consent has been obtained.  Description of procedure:  The patient was taken to the operating room and general anesthesia was induced.  The patient was placed in the dorsal lithotomy position, prepped and draped in the usual sterile fashion, and preoperative antibiotics were administered. A preoperative time-out was performed. 21 French sheath with 30 lens is utilized for the cystoscopy. A wire is placed up the right ureter and the bladder and finding no abnormalities I proceeded to place the wire into the right renal pelvis through a 5 [place] open-ended catheter. There is definite stricture in the distal ureter as is initially cannot get a 5 French catheter into the ureter to do a retrograde pyelogram. I then put a 10 French double-lumen ureteral access catheter over the sensor wire. This 35 sensor wire is left in place the double-lumen dilator and access catheter removed and attempted ureteroscopy is done with a rigid ureteroscope. I  cannot bypass stricture. So I elected to put a 6 French 26 cm stent into good position in the kidney and distally into the bladder. Position was checked cystoscopically. Bladder is emptied 30 mL of Marcaine placed in the bladder and a B&O suppository in the rectum. The string attached to the stent is cut so that patient cannot inadvertently pull it out as of the left stay in 2 weeks    Collier Flowers D.O.

## 2015-07-06 NOTE — H&P (Signed)
HS - RRR without murmur:  Lungs CTA  Right ureteral calculous with pain.  Plan laser lithotripsy  Discussed with Patient including complications of rupture ureter scaring of ureter and breakage of instruments inside the ureter

## 2015-07-06 NOTE — Discharge Instructions (Signed)
Ureteral Stent  Patient Education A stent is a hollow tube that maintains patency until healing can take place or an obstruction is relieved. It allows urine to flow from the kidney to the bladder.  Indications for stenting include: Relief of ureteral obstruction (stones, cancer, stricture) and provide drainage; Promote healing of the ureter by providing internal support after a ureteral procedure  Prevent potential complications by helping place a guidewire into the ureter; Assist in dilating the ureter before the next ureteroscopy; Bypass obstructions, either from internal or external causes;  Procedure: Under general anesthesia in a cystoscopy suite, the ureteroscope is introduced into the bladder through the urethra and then up into the desired ureter.   Stent Removal: Remove in 2-7 days after ureteroscopy in uncomplicated cases; Remove in 1-2 weeks in cases of ureteral perforation or persisting concern of obstruction; A string may be left on your stent and you will be instructed by your provider when to gently pull the string to remove your stent at home.  Can be removed in the office with topical anesthesia with a flexible ureteroscope and grasper; Can be removed in the cysto suite under anesthesia for patients unable to tolerate topical anesthesia; If required for long term use (extrinsic compression by tumor, stricture), stents should be changed every 6 months  What to expect while stent is in place Blood in the urine intermittently while stent is in place. Usually it is most severe the first few days after surgery, but it can persist the entire time that the stent is in place. Symptoms of urinary frequency and urgency that is caused by the lower end of the stent coiling into and irritating the bladder.  Precautions: Do not have sexual intercourse while stent is in place or participate in other strenuous activity.  Please notify your physicians office as soon as possible  if: your stent falls out. It is very rare but if the stent comes out, please rinse it with water, place it in a ziplock bag and bring it with you to your appointment. you are experiencing extreme pain, prolonged nausea/vomiting or fever >100.5 F   [facility] [address] [place],  [address] [phone]  Drink 2 quarts of water a day regular diet no exercise limitations no work limitations       AMBULATORY SURGERY  DISCHARGE INSTRUCTIONS   1) The drugs that you were given will stay in your system until tomorrow so for the next 24 hours you should not:  A) Drive an automobile B) Make any legal decisions C) Drink any alcoholic beverage   2) You may resume regular meals tomorrow.  Today it is better to start with liquids and gradually work up to solid foods.  You may eat anything you prefer, but it is better to start with liquids, then soup and crackers, and gradually work up to solid foods.   3) Please notify your doctor immediately if you have any unusual bleeding, trouble breathing, redness and pain at the surgery site, drainage, fever, or pain not relieved by medication.    4) Additional Instructions:        Please contact your physician with any problems or Same Day Surgery at [phone], Monday through Friday 6 am to 4 pm, or [place] at [facility] number at [phone].AMBULATORY SURGERY  DISCHARGE INSTRUCTIONS   5) The drugs that you were given will stay in your system until tomorrow so for the next 24 hours you should not:  D) Drive an automobile E) Make any legal decisions F) Drink any alcoholic beverage   6) You may resume regular meals tomorrow.  Today it is better to start with liquids and gradually work up to solid foods.  You may eat anything you prefer, but it is better to start with liquids, then soup and crackers, and gradually work up to solid foods.   7) Please notify your doctor  immediately if you have any unusual bleeding, trouble breathing, redness and pain at the surgery site, drainage, fever, or pain not relieved by medication.    8) Additional Instructions:        Please contact your physician with any problems or Same Day Surgery at [phone], Monday through Friday 6 am to 4 pm, or [place] at [facility] number at [phone].

## 2015-07-06 NOTE — Anesthesia Postprocedure Evaluation (Signed)
  Anesthesia Post-op Note  Patient: Aaron Nichols  Procedure(s) Performed: Procedure(s): URETEROSCOPY  (Right) CYSTOSCOPY WITH STENT PLACEMENT (Right)  Anesthesia type:General  Patient location: PACU  Post pain: Pain level controlled  Post assessment: Post-op Vital signs reviewed, Patient's Cardiovascular Status Stable, Respiratory Function Stable, Patent Airway and No signs of Nausea or vomiting  Post vital signs: Reviewed and stable  Last Vitals:  Filed Vitals:   07/06/15 1520  BP: 143/70  Pulse: 84  Temp: 36.1 C  Resp: 15    Level of consciousness: awake, alert  and patient cooperative  Complications: No apparent anesthesia complications

## 2015-07-06 NOTE — Transfer of Care (Signed)
Immediate Anesthesia Transfer of Care Note  Patient: Aaron Nichols  Procedure(s) Performed: Procedure(s): URETEROSCOPY WITH HOLMIUM LASER LITHOTRIPSY (Right) CYSTOSCOPY WITH STENT PLACEMENT (Right)  Patient Location: PACU  Anesthesia Type:General  Level of Consciousness: awake, alert  and oriented  Airway & Oxygen Therapy: Patient Spontanous Breathing and Patient connected to face mask oxygen  Post-op Assessment: Report given to RN and Post -op Vital signs reviewed and stable  Post vital signs: Reviewed and stable  Last Vitals:  Filed Vitals:   07/06/15 1520  BP: 143/70  Pulse: 84  Temp: 36.1 C  Resp: 15    Complications: No apparent anesthesia complications

## 2015-07-07 ENCOUNTER — Other Ambulatory Visit: Payer: Self-pay | Admitting: Urology

## 2015-07-07 ENCOUNTER — Encounter: Payer: Self-pay | Admitting: Urology

## 2015-07-07 DIAGNOSIS — N200 Calculus of kidney: Secondary | ICD-10-CM

## 2015-07-07 MED ORDER — ONDANSETRON HCL 4 MG PO TABS: 4.0000 mg | ORAL_TABLET | Freq: Every day | ORAL | Status: DC | PRN

## 2015-07-07 MED ORDER — TAMSULOSIN HCL 0.4 MG PO CAPS: 0.4000 mg | ORAL_CAPSULE | Freq: Every day | ORAL | Status: DC

## 2015-07-07 MED ORDER — CEFUROXIME AXETIL 250 MG PO TABS: 250.0000 mg | ORAL_TABLET | Freq: Two times a day (BID) | ORAL | Status: DC

## 2015-07-11 ENCOUNTER — Telehealth: Payer: Self-pay | Admitting: Radiology

## 2015-07-11 NOTE — Telephone Encounter (Signed)
Pt scheduled for URS on 07/06/15.

## 2015-07-11 NOTE — Telephone Encounter (Signed)
-----   Message from Nori Riis, PA-C sent at 07/04/2015  1:19 PM EDT ----- Proceed with right URS/LL/right ureteral stent.

## 2015-07-12 ENCOUNTER — Ambulatory Visit (INDEPENDENT_AMBULATORY_CARE_PROVIDER_SITE_OTHER): Payer: 59 | Admitting: Urology

## 2015-07-12 VITALS — BP 149/106 | HR 78 | Ht 70.0 in | Wt 222.8 lb

## 2015-07-12 DIAGNOSIS — N201 Calculus of ureter: Secondary | ICD-10-CM | POA: Diagnosis not present

## 2015-07-12 LAB — URINALYSIS, COMPLETE
BILIRUBIN UA: NEGATIVE
GLUCOSE, UA: NEGATIVE
Ketones, UA: NEGATIVE
Nitrite, UA: NEGATIVE
PH UA: 6 (ref 5.0–7.5)
SPEC GRAV UA: 1.015 (ref 1.005–1.030)
Urobilinogen, Ur: 0.2 mg/dL (ref 0.2–1.0)

## 2015-07-12 LAB — MICROSCOPIC EXAMINATION
BACTERIA UA: NONE SEEN
Epithelial Cells (non renal): NONE SEEN /hpf (ref 0–10)

## 2015-07-12 NOTE — Progress Notes (Signed)
07/12/2015 9:56 AM   Aaron Nichols 24-Aug-1971 [id number]  Referring provider: No referring provider defined for this encounter.  Chief Complaint  Patient presents with  . Nephrolithiasis    surgery pre-op    HPI: The patient is a 44 year old male presents for preoperative consultation. He recently underwent a cystoscopy with right ureteral dilation due to severe stricture by Dr. Elnoria Howard. Dr. Dellia Nims was unable to treat his ureteral stone due to the stricture. He left the stent with plans to return to the operating room after passive dilation with the ureteral stent. He presents today for discussion of this procedure.    PMH: Past Medical History  Diagnosis Date  . Brain tumor     acoustic neuroma  . Hypertension   . Complication of anesthesia 2008    brain tumor 15 hours surgery resulting nausea and vomiting for severeal days  . PONV (postoperative nausea and vomiting)   . Kidney stone 1990    Surgical History: Past Surgical History  Procedure Laterality Date  . Brain tumor excision    . Elbow fracture surgery Left 1998  . Ureteroscopy with holmium laser lithotripsy Right 07/06/2015    Procedure: URETEROSCOPY ;  Surgeon: Collier Flowers, MD;  Location: [facility];  Service: Urology;  Laterality: Right;  . Cystoscopy with stent placement Right 07/06/2015    Procedure: CYSTOSCOPY WITH STENT PLACEMENT;  Surgeon: Collier Flowers, MD;  Location: [facility];  Service: Urology;  Laterality: Right;    Home Medications:    Medication List       This list is accurate as of: 07/12/15  9:56 AM.  Always use your most recent med list.               cefUROXime 250 MG tablet  Commonly known as:  CEFTIN  Take 1 tablet (250 mg total) by mouth 2 (two) times daily with a meal.     famotidine 10 MG tablet  Commonly known as:  PEPCID  Take 10 mg by mouth 2 (two) times daily.     lisinopril 20 MG tablet  Commonly known as:  PRINIVIL,ZESTRIL  Take 20 mg by mouth daily.     multivitamin  with minerals tablet  Take 1 tablet by mouth daily.     oxyCODONE-acetaminophen 5-325 MG per tablet  Commonly known as:  ROXICET  Take 1 tablet by mouth every 6 (six) hours as needed for severe pain.     tamsulosin 0.4 MG Caps capsule  Commonly known as:  FLOMAX  Take 1 capsule (0.4 mg total) by mouth daily.        Allergies: No Known Allergies  Family History: Family History  Problem Relation Age of Onset  . Heart disease Father     Social History:  reports that he has been smoking.  He does not have any smokeless tobacco history on file. He reports that he does not drink alcohol or use illicit drugs.  ROS: UROLOGY Frequent Urination?: Yes Hard to postpone urination?: Yes Burning/pain with urination?: Yes Get up at night to urinate?: Yes Leakage of urine?: No Urine stream starts and stops?: Yes Trouble starting stream?: No Do you have to strain to urinate?: Yes Blood in urine?: Yes Urinary tract infection?: No Sexually transmitted disease?: No Injury to kidneys or bladder?: No Painful intercourse?: No Weak stream?: Yes Erection problems?: No Penile pain?: No  Gastrointestinal Nausea?: No Vomiting?: No Indigestion/heartburn?: No Diarrhea?: No Constipation?: Yes  Constitutional Fever: No Night sweats?: Yes Weight  loss?: No Fatigue?: No  Skin Skin rash/lesions?: No Itching?: No  Eyes Blurred vision?: No Double vision?: No  Ears/Nose/Throat Sore throat?: No Sinus problems?: No  Hematologic/Lymphatic Swollen glands?: No Easy bruising?: No  Cardiovascular Leg swelling?: No Chest pain?: No  Respiratory Cough?: No Shortness of breath?: No  Endocrine Excessive thirst?: No  Musculoskeletal Back pain?: Yes Joint pain?: No  Neurological Headaches?: No Dizziness?: No  Psychologic Depression?: No Anxiety?: No  Physical Exam: BP 149/106 mmHg  Pulse 78  Ht 5\' 10"  (1.778 m)  Wt 222 lb 12.8 oz (101.061 kg)  BMI 31.97 kg/m2    Constitutional:  Alert and oriented, No acute distress. HEENT: [place] AT, moist mucus membranes.  Trachea midline, no masses. Cardiovascular: No clubbing, cyanosis, or edema. Respiratory: Normal respiratory effort, no increased work of breathing. GI: Abdomen is soft, nontender, nondistended, no abdominal masses GU: No CVA tenderness.  Skin: No rashes, bruises or suspicious lesions. Lymph: No cervical or inguinal adenopathy. Neurologic: Grossly intact, no focal deficits, moving all 4 extremities. Psychiatric: Normal mood and affect.  Laboratory Data: Lab Results  Component Value Date   WBC 10.1 06/20/2015   HGB 14.0 06/20/2015   HCT 41.9 06/20/2015   MCV 90.1 06/20/2015   PLT 245 06/20/2015    Lab Results  Component Value Date   CREATININE 0.92 06/20/2015    No results found for: PSA  No results found for: TESTOSTERONE  No results found for: HGBA1C  Urinalysis    Component Value Date/Time   COLORURINE YELLOW 06/25/2015 0921   APPEARANCEUR CLEAR 06/25/2015 0921   LABSPEC <1.005* 06/25/2015 0921   PHURINE 5.0 06/25/2015 0921   GLUCOSEU NEGATIVE 06/25/2015 0921   HGBUR 3+* 06/25/2015 0921   BILIRUBINUR NEGATIVE 06/25/2015 0921   KETONESUR NEGATIVE 06/25/2015 0921   PROTEINUR NEGATIVE 06/25/2015 0921   NITRITE NEGATIVE 06/25/2015 0921   LEUKOCYTESUR NEGATIVE 06/25/2015 0921      Assessment & Plan:    1. Right ureteral stone The patient is scheduled with Dr. Elnoria Howard for repeat right ureteroscopy and laser lithotripsy in 2 weeks. He understands the risks, benefits, and indications of this procedure. All questions were answered. - Urinalysis, Complete - CULTURE, URINE COMPREHENSIVE   No Follow-up on file.  Nickie Retort, MD  [facility] [address] [place], [place] [address] [phone]

## 2015-07-13 ENCOUNTER — Ambulatory Visit: Payer: 59

## 2015-07-14 LAB — CULTURE, URINE COMPREHENSIVE

## 2015-07-18 ENCOUNTER — Telehealth: Payer: Self-pay

## 2015-07-18 ENCOUNTER — Ambulatory Visit
Admission: RE | Admit: 2015-07-18 | Discharge: 2015-07-18 | Disposition: A | Discharge status: Home / Self Care | Payer: 59 | Source: Ambulatory Visit | Attending: Urology | Admitting: Urology

## 2015-07-18 ENCOUNTER — Encounter: Payer: Self-pay | Admitting: *Deleted

## 2015-07-18 ENCOUNTER — Other Ambulatory Visit: Payer: 59

## 2015-07-18 ENCOUNTER — Ambulatory Visit: Payer: 59

## 2015-07-18 DIAGNOSIS — N200 Calculus of kidney: Secondary | ICD-10-CM | POA: Diagnosis present

## 2015-07-18 NOTE — Patient Instructions (Signed)
  Your procedure is scheduled on:07/25/15 Report to Day Surgery.[facility]OND FLOOR To find out your arrival time please call [phone] between 1PM - 3PM on 07/22/15 Remember: Instructions that are not followed completely may result in serious medical risk, up to and including death, or upon the discretion of your surgeon and anesthesiologist your surgery may need to be rescheduled.    _X___ 1. Do not eat food or drink liquids after midnight. No gum chewing or hard candies.     __X__ 2. No Alcohol for 24 hours before or after surgery.   ____ 3. Bring all medications with you on the day of surgery if instructed.    __X__ 4. Notify your doctor if there is any change in your medical condition     (cold, fever, infections).     Do not wear jewelry, make-up, hairpins, clips or nail polish.  Do not wear lotions, powders, or perfumes. You may wear deodorant.  Do not shave 48 hours prior to surgery. Men may shave face and neck.  Do not bring valuables to the hospital.    [facility] is not responsible for any belongings or valuables.               Contacts, dentures or bridgework may not be worn into surgery.  Leave your suitcase in the car. After surgery it may be brought to your room.  For patients admitted to the hospital, discharge time is determined by your                treatment team.   Patients discharged the day of surgery will not be allowed to drive home.   Please read over the following fact sheets that you were given:   Surgical Site Infection Prevention   ____ Take these medicines the morning of surgery with A SIP OF WATER:    1. PEPCID  2.   3.   4.  5.  6.  ____ Fleet Enema (as directed)   ____ Use CHG Soap as directed  ____ Use inhalers on the day of surgery  ____ Stop metformin 2 days prior to surgery    ____ Take 1/2 of usual insulin dose the night before surgery and none on the morning of surgery.   ____ Stop Coumadin/Plavix/aspirin on ____ Stop  Anti-inflammatories on   ____ Stop supplements until after surgery.    ____ Bring C-Pap to the hospital.

## 2015-07-18 NOTE — Telephone Encounter (Signed)
Pt called stating he passed a piece of a stone. Pt stated he measures it to be 1/4 mm. Per Larene Beach pt is to bring in fragment and get a KUB. Pt voiced understanding.

## 2015-07-19 ENCOUNTER — Telehealth: Payer: Self-pay

## 2015-07-19 NOTE — Telephone Encounter (Signed)
Pt does not currently have an appt with Dr. Elnoria Howard. Pt surgery is 07/25/15. Dr. Elnoria Howard is not in the office before pt surgery.

## 2015-07-19 NOTE — Telephone Encounter (Signed)
Is is scheduled with Dr. Elnoria Howard?  What day?

## 2015-07-19 NOTE — Telephone Encounter (Signed)
-----   Message from Nori Riis, PA-C sent at 07/18/2015  4:11 PM EDT ----- The right distal stone may still be present adjacent to the stent in the bladder. I would like the physician that we'll be performing his surgery to look at his KUB and give their opinion.

## 2015-07-19 NOTE — Telephone Encounter (Signed)
LMOM- in reference to KUB and surgical physician looking at KUB. Do I need to fwd this message to doctor or will you speak with them?

## 2015-07-21 NOTE — Telephone Encounter (Signed)
-----   Message from Nori Riis, PA-C sent at 07/21/2015  8:27 AM EDT ----- Proceed with right URS/LL/right stent exchange.

## 2015-07-21 NOTE — Telephone Encounter (Signed)
Spoke with in reference to surgery. Pt voiced understanding.

## 2015-07-22 ENCOUNTER — Other Ambulatory Visit: Payer: Self-pay | Admitting: Urology

## 2015-07-25 ENCOUNTER — Encounter: Payer: Self-pay | Admitting: Urology

## 2015-07-25 ENCOUNTER — Ambulatory Visit: Payer: 59 | Admitting: Certified Registered Nurse Anesthetist

## 2015-07-25 ENCOUNTER — Ambulatory Visit
Admission: RE | Admit: 2015-07-25 | Discharge: 2015-07-25 | Disposition: A | Discharge status: Home / Self Care | Payer: 59 | Source: Ambulatory Visit | Attending: Urology | Admitting: Urology

## 2015-07-25 ENCOUNTER — Encounter
Admission: RE | Disposition: A | Discharge status: Home / Self Care | Payer: Self-pay | Source: Ambulatory Visit | Attending: Urology

## 2015-07-25 DIAGNOSIS — Z87442 Personal history of urinary calculi: Secondary | ICD-10-CM | POA: Diagnosis not present

## 2015-07-25 DIAGNOSIS — Z86011 Personal history of benign neoplasm of the brain: Secondary | ICD-10-CM | POA: Insufficient documentation

## 2015-07-25 DIAGNOSIS — F17200 Nicotine dependence, unspecified, uncomplicated: Secondary | ICD-10-CM | POA: Diagnosis not present

## 2015-07-25 DIAGNOSIS — Z79899 Other long term (current) drug therapy: Secondary | ICD-10-CM | POA: Insufficient documentation

## 2015-07-25 DIAGNOSIS — I10 Essential (primary) hypertension: Secondary | ICD-10-CM | POA: Diagnosis not present

## 2015-07-25 DIAGNOSIS — Z9889 Other specified postprocedural states: Secondary | ICD-10-CM | POA: Diagnosis not present

## 2015-07-25 DIAGNOSIS — Z79891 Long term (current) use of opiate analgesic: Secondary | ICD-10-CM | POA: Diagnosis not present

## 2015-07-25 DIAGNOSIS — N201 Calculus of ureter: Secondary | ICD-10-CM | POA: Insufficient documentation

## 2015-07-25 HISTORY — DX: Abnormal results of liver function studies: R94.5

## 2015-07-25 HISTORY — DX: Gastro-esophageal reflux disease without esophagitis: K21.9

## 2015-07-25 HISTORY — DX: Other specified abnormal findings of blood chemistry: R79.89

## 2015-07-25 HISTORY — PX: URETEROSCOPY WITH HOLMIUM LASER LITHOTRIPSY: SHX6645

## 2015-07-25 SURGERY — URETEROSCOPY, WITH LITHOTRIPSY USING HOLMIUM LASER
Anesthesia: General | Laterality: Right | Wound class: Clean Contaminated

## 2015-07-25 MED ORDER — FENTANYL CITRATE (PF) 100 MCG/2ML IJ SOLN
INTRAMUSCULAR | Status: DC | PRN
  Administered 2015-07-25: 50 ug via INTRAVENOUS
  Administered 2015-07-25 (×4): 25 ug via INTRAVENOUS

## 2015-07-25 MED ORDER — ONDANSETRON HCL 4 MG/2ML IJ SOLN
INTRAMUSCULAR | Status: DC | PRN
  Administered 2015-07-25: 4 mg via INTRAVENOUS

## 2015-07-25 MED ORDER — GLYCOPYRROLATE 0.2 MG/ML IJ SOLN
INTRAMUSCULAR | Status: DC | PRN
  Administered 2015-07-25: 0.2 mg via INTRAVENOUS

## 2015-07-25 MED ORDER — ONDANSETRON HCL 4 MG/2ML IJ SOLN
INTRAMUSCULAR | Status: AC
  Filled 2015-07-25: qty 2

## 2015-07-25 MED ORDER — HYDROMORPHONE HCL 1 MG/ML IJ SOLN: 0.2500 mg | INTRAMUSCULAR | Status: DC | PRN

## 2015-07-25 MED ORDER — PROPOFOL 10 MG/ML IV BOLUS
INTRAVENOUS | Status: DC | PRN
  Administered 2015-07-25: 200 mg via INTRAVENOUS

## 2015-07-25 MED ORDER — SODIUM CHLORIDE 0.9 % IR SOLN
Status: DC | PRN
  Administered 2015-07-25: 600 mL

## 2015-07-25 MED ORDER — LIDOCAINE HCL (CARDIAC) 20 MG/ML IV SOLN
INTRAVENOUS | Status: DC | PRN
  Administered 2015-07-25: 60 mg via INTRAVENOUS

## 2015-07-25 MED ORDER — MIDAZOLAM HCL 2 MG/2ML IJ SOLN
INTRAMUSCULAR | Status: DC | PRN
  Administered 2015-07-25: 2 mg via INTRAVENOUS

## 2015-07-25 MED ORDER — FENTANYL CITRATE (PF) 100 MCG/2ML IJ SOLN
INTRAMUSCULAR | Status: AC
  Filled 2015-07-25: qty 2

## 2015-07-25 MED ORDER — OXYCODONE-ACETAMINOPHEN 5-325 MG PO TABS
ORAL_TABLET | ORAL | Status: DC
Start: 2015-07-25 — End: 2015-07-25
  Filled 2015-07-25: qty 1

## 2015-07-25 MED ORDER — ONDANSETRON HCL 4 MG/2ML IJ SOLN
4.0000 mg | Freq: Once | INTRAMUSCULAR | Status: AC | PRN
  Administered 2015-07-25: 4 mg via INTRAVENOUS

## 2015-07-25 MED ORDER — DEXAMETHASONE SODIUM PHOSPHATE 4 MG/ML IJ SOLN
INTRAMUSCULAR | Status: DC | PRN
  Administered 2015-07-25: 8 mg via INTRAVENOUS

## 2015-07-25 MED ORDER — FENTANYL CITRATE (PF) 100 MCG/2ML IJ SOLN
25.0000 ug | INTRAMUSCULAR | Status: DC | PRN
  Administered 2015-07-25 (×3): 25 ug via INTRAVENOUS

## 2015-07-25 MED ORDER — LIDOCAINE HCL (PF) 1 % IJ SOLN
INTRAMUSCULAR | Status: AC
  Filled 2015-07-25: qty 2

## 2015-07-25 MED ORDER — BELLADONNA ALKALOIDS-OPIUM 16.2-60 MG RE SUPP
RECTAL | Status: DC | PRN
  Administered 2015-07-25: 1 via RECTAL

## 2015-07-25 MED ORDER — PHENYLEPHRINE HCL 10 MG/ML IJ SOLN
INTRAMUSCULAR | Status: DC | PRN
  Administered 2015-07-25: 50 ug via INTRAVENOUS

## 2015-07-25 MED ORDER — BUPIVACAINE HCL 0.5 % IJ SOLN
INTRAMUSCULAR | Status: DC | PRN
  Administered 2015-07-25: 30 mL

## 2015-07-25 MED ORDER — LACTATED RINGERS IV SOLN
INTRAVENOUS | Status: DC
  Administered 2015-07-25: 12:00:00 via INTRAVENOUS

## 2015-07-25 MED ORDER — OXYCODONE-ACETAMINOPHEN 5-325 MG PO TABS
1.0000 | ORAL_TABLET | Freq: Four times a day (QID) | ORAL | Status: DC | PRN
  Administered 2015-07-25: 1 via ORAL

## 2015-07-25 MED ORDER — BUPIVACAINE HCL (PF) 0.5 % IJ SOLN
INTRAMUSCULAR | Status: AC
  Filled 2015-07-25: qty 30

## 2015-07-25 MED ORDER — BELLADONNA ALKALOIDS-OPIUM 16.2-60 MG RE SUPP
RECTAL | Status: AC
  Filled 2015-07-25: qty 1

## 2015-07-25 SURGICAL SUPPLY — 29 items
BAG DRAIN CYSTO-URO LG1000N (MISCELLANEOUS) ×3 IMPLANT
BASKET ZERO TIP 1.9FR (BASKET) ×3 IMPLANT
CATH URETL 5X70 OPEN END (CATHETERS) ×3 IMPLANT
CNTNR SPEC 2.5X3XGRAD LEK (MISCELLANEOUS)
CONRAY 43 FOR UROLOGY 50M (MISCELLANEOUS) ×3 IMPLANT
CONT SPEC 4OZ STER OR WHT (MISCELLANEOUS)
CONTAINER SPEC 2.5X3XGRAD LEK (MISCELLANEOUS) IMPLANT
FEE TECHNICIAN ONLY PER HOUR (MISCELLANEOUS) IMPLANT
GLOVE BIO SURGEON STRL SZ7 (GLOVE) ×6 IMPLANT
GLOVE BIO SURGEON STRL SZ7.5 (GLOVE) ×3 IMPLANT
GOWN STRL REUS W/ TWL LRG LVL3 (GOWN DISPOSABLE) ×1 IMPLANT
GOWN STRL REUS W/ TWL XL LVL3 (GOWN DISPOSABLE) ×1 IMPLANT
GOWN STRL REUS W/TWL LRG LVL3 (GOWN DISPOSABLE) ×2
GOWN STRL REUS W/TWL XL LVL3 (GOWN DISPOSABLE) ×2
GUIDEWIRE ANG ZIPWIRE 035X150 (WIRE) ×3 IMPLANT
GUIDEWIRE STR ZIPWIRE 035X150 (MISCELLANEOUS) ×3 IMPLANT
INTRODUCER DILATOR DOUBLE (INTRODUCER) ×3 IMPLANT
LASER HOLMIUM FIBER SU 272UM (MISCELLANEOUS) IMPLANT
LASER HOLMIUM PROCEDURE (MISCELLANEOUS) ×3 IMPLANT
PACK CYSTO AR (MISCELLANEOUS) ×3 IMPLANT
PREP PVP WINGED SPONGE (MISCELLANEOUS) ×3 IMPLANT
SENSORWIRE 0.038 NOT ANGLED (WIRE) ×3
SET CYSTO W/LG BORE CLAMP LF (SET/KITS/TRAYS/PACK) ×3 IMPLANT
SHEATH URETERAL 13/15X36 1L (SHEATH) ×3 IMPLANT
SOL .9 NS 3000ML IRR  AL (IV SOLUTION) ×2
SOL .9 NS 3000ML IRR UROMATIC (IV SOLUTION) ×1 IMPLANT
SURGILUBE 2OZ TUBE FLIPTOP (MISCELLANEOUS) ×3 IMPLANT
WATER STERILE IRR 1000ML POUR (IV SOLUTION) ×3 IMPLANT
WIRE SENSOR 0.038 NOT ANGLED (WIRE) ×1 IMPLANT

## 2015-07-25 NOTE — Op Note (Signed)
Date of procedure: 07/25/2015  Preoperative diagnosis:  1. Right ureteral calculus  Postoperative diagnosis:  1. Same  Procedure: 1. Cystoscopy right flexible ureteroscopy with laser lithotripsy of right ureteral calculus, extraction of calculus.  Surgeon: Collier Flowers D.O.  Anesthesia: General  Complications: None  Intraoperative findings: Mid right ureteral calculus negative renal calculi  EBL: 0  Specimens: None  Drains: None  Indication: Aaron Nichols is a 44 y.o. patient with with a known right ureteral calculus and stent After reviewing the management options for treatment, he elected to proceed with the above surgical procedure(s). We have discussed the potential benefits and risks of the procedure, side effects of the proposed treatment, the likelihood of the patient achieving the goals of the procedure, and any potential problems that might occur during the procedure or recuperation. Informed consent has been obtained.  Description of procedure:  The patient was taken to the operating room and general anesthesia was induced.  The patient was placed in the dorsal lithotomy position, prepped and draped in the usual sterile fashion, and preoperative antibiotics were administered. A preoperative time-out was performed.   Utilizing a 21 French 6 ureteral sheath and a 30 lens the previously placed right ureteral stent is pulled into the urethral meatus and just beyond the meatus. Then a 035 sensor wires placed up the stent into the kidney under fluoroscopy appendectomy. A right rigid ureteroscopy is attempted but no calculus was seen so I do a right flexible ureteroscopy. Flexible scope has to be placed through a Navigator access sheath so I take a double lumen 10 French ureteral access sheath place a second wire up into the kidney. Then a Navigator sheath is placed over the second wire which is a Glidewire. Glidewire in obturator of the access sheath are removed once a known in  the ureter. Then I easily take the ureteral route with the flexible scope into the kidney. The kidneys explored no calculi are seen within any of the calyces so then I do an antegrade movement down the scope from the antegrade to the retrograde position and moved down the ureter ureter slowly. A small calculus is seen in the upper third of the ureter. At 260  holmium lasers and used to disintegrate calculus and the calculus is removed with a 3 wire basket. The sheath is removed the wires removed bladder is emptied and 30 mL of half percent Marcaine placed in the bladder patient sent to recovery in satisfactory condition Collier Flowers D.O.

## 2015-07-25 NOTE — Anesthesia Preprocedure Evaluation (Signed)
Anesthesia Evaluation  Patient identified by MRN, date of birth, ID band Patient awake    Reviewed: Allergy & Precautions, H&P , NPO status , Patient's Chart, lab work & pertinent test results, reviewed documented beta blocker date and time   History of Anesthesia Complications (+) PONV and history of anesthetic complications  Airway Mallampati: III  TM Distance: >3 FB Neck ROM: full    Dental  (+) Teeth Intact   Pulmonary neg pulmonary ROS, Current Smoker,           Cardiovascular Exercise Tolerance: Good hypertension, negative cardio ROS Normal cardiovascular exam Rhythm:regular Rate:Normal     Neuro/Psych    GI/Hepatic negative GI ROS, Neg liver ROS, GERD  ,  Endo/Other  negative endocrine ROS  Renal/GU Renal disease     Musculoskeletal   Abdominal   Peds  Hematology negative hematology ROS (+)   Anesthesia Other Findings   Reproductive/Obstetrics negative OB ROS                             Anesthesia Physical Anesthesia Plan  ASA: III  Anesthesia Plan: General LMA   Post-op Pain Management:    Induction:   Airway Management Planned:   Additional Equipment:   Intra-op Plan:   Post-operative Plan:   Informed Consent: I have reviewed the patients History and Physical, chart, labs and discussed the procedure including the risks, benefits and alternatives for the proposed anesthesia with the patient or authorized representative who has indicated his/her understanding and acceptance.     Plan Discussed with: CRNA  Anesthesia Plan Comments:         Anesthesia Quick Evaluation

## 2015-07-25 NOTE — Discharge Instructions (Signed)
Drink 2 quarts of water a day,                                                      Ureteroscopy  A Ureteroscopy is an examination of the upper urinary tract, performed with a ureteroscope that is passed through the urethra, the bladder, and then directly into the ureter. It is performed to find the cause of urine blockage in a ureter, perform a biopsy or identify and evaluate other abnormalities inside the ureters or kidneys. It is useful in the diagnosis and treatment of disorders such as kidney stones, ureteral strictures or other abnormalities of the ureter. Smaller stones in the bladder or lower ureter can be removed in one piece, while bigger ones are usually broken before removal during ureteroscopy.  *After your ureteroscopy, your doctor may need to place a stent in a ureter to drain urine from the kidney to the bladder while swelling in the ureter goes away. The stent may cause some discomfort to your kidney and ureter. The discomfort is generally mild. The stent may be left in for a week or more. You will be instructed to either remove the stent yourself at home by pulling a string protruding from your urethra or to return to our office for removal by your doctor.  After a ureteroscopy you may  have a mild burning feeling when urinating see small amounts of blood in the urine have mild discomfort in the bladder area or kidney area when urinating need to urinate more frequently or urgently *These problems should not last more than 24 hours unless a ureteral stent was placed.  If a stent was placed symptoms symptoms may continue until it is removed. You should notify your doctor right away if bleeding or pain is severe.  To prevent or relieve discomfort it can be helpful to drink 16 ounces of water each hour for 2 hours after the procedure take a warm bath to relieve the burning feeling hold a warm, damp washcloth over the urethral opening to relieve discomfort take an over-the-counter  pain reliever  Please notify our office if you experience any of the following symptoms burning with urination lasting more than 24hrs Fever greater than 100F or present directly to emergency department abdominal or flank pain very bloody, cloudy or foul smelling urine  *If you have any additional questions or concerns please call our office at [phone]  [facility] [address], [address] [place], [place] [address] [phone]    [place]   1) The drugs that you were given will stay in your system until tomorrow so for the next 24 hours you should not:  A) Drive an automobile B) Make any legal decisions C) Drink any alcoholic beverage   2) You may resume regular meals tomorrow.  Today it is better to start with liquids and gradually work up to solid foods.  You may eat anything you prefer, but it is better to start with liquids, then soup and crackers, and gradually work up to solid foods.   3) Please notify your doctor immediately if you have any unusual bleeding, trouble breathing, redness and pain at the surgery site, drainage, fever, or pain not relieved by medication.    4) Additional Instructions:  Additional Instructions: ° ° ° ° ° ° ° °Please contact your physician with any problems or Same Day Surgery at [phone], Monday through Friday 6 am to 4 pm, or [place] [facility] [place] Main number at [phone]. ° ° ° ° °

## 2015-07-25 NOTE — OR Nursing (Signed)
Patient c/o head ache and some burning in the penis med given

## 2015-07-25 NOTE — Transfer of Care (Signed)
Immediate Anesthesia Transfer of Care Note  Patient: Aaron Nichols  Procedure(s) Performed: Procedure(s): URETEROSCOPY WITH HOLMIUM LASER LITHOTRIPSY, CYSTOSCOPY,  STENT REMOVAL  (Right)  Patient Location: PACU  Anesthesia Type:General  Level of Consciousness: awake, alert  and oriented  Airway & Oxygen Therapy: Patient Spontanous Breathing and Patient connected to face mask oxygen  Post-op Assessment: Report given to RN and Post -op Vital signs reviewed and stable  Post vital signs: Reviewed and stable  Last Vitals:  Filed Vitals:   07/25/15 1106  BP: 150/97  Pulse: 76  Temp: 36.3 C  Resp: 16    Complications: No apparent anesthesia complications

## 2015-07-25 NOTE — H&P (Signed)
Patient has distal right ureteral calculous with stent.  Ureteroscopy and laser lithotripsy.  HS  RRR Lungs CTA

## 2015-07-26 ENCOUNTER — Telehealth: Payer: Self-pay | Admitting: Urology

## 2015-07-26 NOTE — Telephone Encounter (Signed)
Patient called wanting to know if he is going to have surgery in 3weeks, and wanted to see if he should stay out of work until then. Due to episodic flare ups causing him to leave work and get written up and if he gets more than 6 times will be terminated. Per the patient his FLMA is approved through October but he wanted to make sure we supported this decision

## 2015-07-26 NOTE — Telephone Encounter (Signed)
Pt has questions regarding his postop from surgery that he had yesterday 10/3.  Please call [phone]

## 2015-07-29 NOTE — Anesthesia Postprocedure Evaluation (Signed)
  Anesthesia Post-op Note  Patient: Aaron Nichols  Procedure(s) Performed: Procedure(s): URETEROSCOPY WITH HOLMIUM LASER LITHOTRIPSY, CYSTOSCOPY,  STENT REMOVAL  (Right)  Anesthesia type:General LMA  Patient location: PACU  Post pain: Pain level controlled  Post assessment: Post-op Vital signs reviewed, Patient's Cardiovascular Status Stable, Respiratory Function Stable, Patent Airway and No signs of Nausea or vomiting  Post vital signs: Reviewed and stable  Last Vitals:  Filed Vitals:   07/25/15 1406  BP: 142/90  Pulse: 66  Temp:   Resp: 16    Level of consciousness: awake, alert  and patient cooperative  Complications: No apparent anesthesia complications

## 2015-08-04 ENCOUNTER — Telehealth: Payer: Self-pay

## 2015-08-04 ENCOUNTER — Ambulatory Visit
Admission: RE | Admit: 2015-08-04 | Discharge: 2015-08-04 | Disposition: A | Discharge status: Home / Self Care | Payer: 59 | Source: Ambulatory Visit | Attending: Urology | Admitting: Urology

## 2015-08-04 DIAGNOSIS — N200 Calculus of kidney: Secondary | ICD-10-CM

## 2015-08-04 MED ORDER — OXYCODONE-ACETAMINOPHEN 5-325 MG PO TABS: 1.0000 | ORAL_TABLET | Freq: Four times a day (QID) | ORAL | Status: DC | PRN

## 2015-08-04 NOTE — Telephone Encounter (Signed)
Spoke with pt in reference to KUB and renal u/s. Made pt aware Dr. Pilar Jarvis stated everything looked good therefore no more pain medication will be given. Pt voiced understanding.

## 2015-08-04 NOTE — Telephone Encounter (Signed)
Pt called c/o severe right side flank pain. Pt had surgery on 07/25/15 and no stent was left in place. Per Dr. Pilar Jarvis pt needs a STAT kub and renal u/s. Pt was also given a script for percocet for pain. Pt voiced understanding stating he was headed to the imaging center now.

## 2015-08-23 ENCOUNTER — Ambulatory Visit (INDEPENDENT_AMBULATORY_CARE_PROVIDER_SITE_OTHER): Payer: 59 | Admitting: Urology

## 2015-08-23 ENCOUNTER — Encounter: Payer: Self-pay | Admitting: Urology

## 2015-08-23 VITALS — BP 185/114 | HR 92 | Ht 70.0 in | Wt 227.0 lb

## 2015-08-23 DIAGNOSIS — N200 Calculus of kidney: Secondary | ICD-10-CM

## 2015-08-23 LAB — URINALYSIS, COMPLETE
Bilirubin, UA: NEGATIVE
GLUCOSE, UA: NEGATIVE
Ketones, UA: NEGATIVE
Leukocytes, UA: NEGATIVE
NITRITE UA: NEGATIVE
PH UA: 5.5 (ref 5.0–7.5)
Protein, UA: NEGATIVE
Specific Gravity, UA: 1.02 (ref 1.005–1.030)
UUROB: 0.2 mg/dL (ref 0.2–1.0)

## 2015-08-23 LAB — MICROSCOPIC EXAMINATION
BACTERIA UA: NONE SEEN
Epithelial Cells (non renal): NONE SEEN /hpf (ref 0–10)
RENAL EPITHEL UA: NONE SEEN /HPF
WBC UA: NONE SEEN /HPF (ref 0–?)

## 2015-08-23 NOTE — Progress Notes (Signed)
08/23/2015 10:57 AM   Aaron Nichols 09/09/1971 [id number]  Referring provider: No referring provider defined for this encounter.  Chief Complaint  Patient presents with  . Nephrolithiasis    post op    HPI: Left renal calculus with previous right renal calculus with ureteral obstruction. The right renal calculus and ureteral calculus or gone post laser lithotripsy. Plan plan left ureteroscopy and left laser lithotripsy of his left calculus. He is complaining of nighttime discomfort especially when he turns on his side in the he wants this stone taken care of sooner than later.     PMH: Past Medical History  Diagnosis Date  . 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 Hypertension   . Complication of anesthesia 2008    brain tumor 15 hours surgery resulting nausea and vomiting for severeal days  . PONV (postoperative nausea and vomiting)   . Kidney stone 1990  . GERD (gastroesophageal reflux disease)   . Abnormal LFTs (liver function tests)     Surgical History: Past Surgical History  Procedure Laterality Date  . Brain tumor excision    . Elbow fracture surgery Left 1998  . Ureteroscopy with holmium laser lithotripsy Right 07/06/2015    Procedure: URETEROSCOPY ;  Surgeon: Collier Flowers, MD;  Location: [facility];  Service: Urology;  Laterality: Right;  . Cystoscopy with stent placement Right 07/06/2015    Procedure: CYSTOSCOPY WITH STENT PLACEMENT;  Surgeon: Collier Flowers, MD;  Location: [facility];  Service: Urology;  Laterality: Right;  . Ureteroscopy with holmium laser lithotripsy Right 07/25/2015    Procedure: URETEROSCOPY WITH HOLMIUM LASER LITHOTRIPSY, CYSTOSCOPY,  STENT REMOVAL ;  Surgeon: Collier Flowers, MD;  Location: [facility];  Service: Urology;  Laterality: Right;    Home Medications:    Medication List       This list is accurate as of: 08/23/15 10:57 AM.  Always use your most recent med list.               famotidine 10 MG tablet  Commonly known as:   PEPCID  Take 10 mg by mouth 2 (two) times daily.     lisinopril 20 MG tablet  Commonly known as:  PRINIVIL,ZESTRIL  Take 20 mg by mouth daily.     multivitamin with minerals tablet  Take 1 tablet by mouth daily.        Allergies:  Allergies  Allergen Reactions  . Ceftin [Cefuroxime Axetil]     Dizziness,lightheaded    Family History: Family History  Problem Relation Age of Onset  . Heart disease Father     Social History:  reports that he has been smoking.  He does not have any smokeless tobacco history on file. He reports that he does not drink alcohol or use illicit drugs.  ROS: UROLOGY Frequent Urination?: No Hard to postpone urination?: No Burning/pain with urination?: No Get up at night to urinate?: No Leakage of urine?: No Urine stream starts and stops?: No Trouble starting stream?: No Do you have to strain to urinate?: No Blood in urine?: No Urinary tract infection?: No Sexually transmitted disease?: No Injury to kidneys or bladder?: No Painful intercourse?: No Weak stream?: No Erection problems?: No Penile pain?: No  Gastrointestinal Nausea?: No Vomiting?: No Indigestion/heartburn?: No Diarrhea?: No Constipation?: No  Constitutional Fever: No Night sweats?: No Weight loss?: No Fatigue?: No  Skin Skin rash/lesions?: No Itching?: No  Eyes Blurred vision?: No Double vision?: No  Ears/Nose/Throat Sore  throat?: No Sinus problems?: No  Hematologic/Lymphatic Swollen glands?: No Easy bruising?: No  Cardiovascular Leg swelling?: No Chest pain?: No  Respiratory Cough?: No Shortness of breath?: No  Endocrine Excessive thirst?: No  Musculoskeletal Back pain?: Yes Joint pain?: No  Neurological Headaches?: No Dizziness?: No  Psychologic Depression?: No Anxiety?: No  Physical Exam: BP 185/114 mmHg  Pulse 92  Ht 5\' 10"  (1.778 m)  Wt 227 lb (102.967 kg)  BMI 32.57 kg/m2  Constitutional:  Alert and oriented, No acute  distress. HEENT: [place] AT, moist mucus membranes.  Trachea midline, no masses. Cardiovascular: No clubbing, cyanosis, or edema. Respiratory: Normal respiratory effort, no increased work of breathing. GI: Abdomen is soft, nontender, nondistended, no abdominal masses GU: No CVA tenderness. Intermittent evening pain and low back Skin: No rashes, bruises or suspicious lesions. Lymph: No cervical or inguinal adenopathy. Neurologic: Grossly intact, no focal deficits, moving all 4 extremities. Psychiatric: Normal mood and affect.  Laboratory Data: Lab Results  Component Value Date   WBC 10.1 06/20/2015   HGB 14.0 06/20/2015   HCT 41.9 06/20/2015   MCV 90.1 06/20/2015   PLT 245 06/20/2015    Lab Results  Component Value Date   CREATININE 0.92 06/20/2015    No results found for: PSA  No results found for: TESTOSTERONE  No results found for: HGBA1C  Urinalysis    Component Value Date/Time   COLORURINE YELLOW 06/25/2015 0921   APPEARANCEUR CLEAR 06/25/2015 0921   LABSPEC <1.005* 06/25/2015 0921   PHURINE 5.0 06/25/2015 0921   GLUCOSEU Negative 07/12/2015 0931   HGBUR 3+* 06/25/2015 0921   BILIRUBINUR Negative 07/12/2015 0931   BILIRUBINUR NEGATIVE 06/25/2015 0921   KETONESUR NEGATIVE 06/25/2015 0921   PROTEINUR NEGATIVE 06/25/2015 0921   NITRITE Negative 07/12/2015 0931   NITRITE NEGATIVE 06/25/2015 0921   LEUKOCYTESUR 2+* 07/12/2015 0931   LEUKOCYTESUR NEGATIVE 06/25/2015 0921    Pertinent Imaging: KUB  Assessment & Plan:  An 11 mm lower pole left renal calculus with intermittent obstruction especially at night and discomfort from this. She is right ureteroscopy was successful. His left side ureteroscopy. I cannot visualize the stone on KUB. Therefore he will need ureteroscopy laser lithotripsy was commenced.  1 He has KUB with no 11 mm calculus is visualized although it's been seen on ultrasound. Renal stone  - Urinalysis, Complete - CULTURE, URINE COMPREHENSIVE   No  Follow-up on file.  Collier Flowers, [facility] [address], [place], [place] [address] [phone]

## 2015-08-24 ENCOUNTER — Telehealth: Payer: Self-pay | Admitting: Radiology

## 2015-08-24 NOTE — Telephone Encounter (Signed)
LMOM. Need to notify pt of surgery/pre-admit appt.

## 2015-08-24 NOTE — Telephone Encounter (Signed)
Notified pt of surgery scheduled 09/07/15, pre-admit phone interview on 08/30/15 between 9am-1pm and to call day before surgery for arrival time to [facility]. Pt advised to be npo after mn day of surgery and to avoid NSAIDS and ASA 72 hrs prior to surgery. Pt voices understanding.

## 2015-08-25 LAB — CULTURE, URINE COMPREHENSIVE

## 2015-08-30 ENCOUNTER — Encounter: Payer: Self-pay | Admitting: *Deleted

## 2015-08-30 ENCOUNTER — Other Ambulatory Visit: Payer: 59

## 2015-08-30 NOTE — Patient Instructions (Signed)
  Your procedure is scheduled on: 09/07/15 Report to Day Surgery. [facility]OND FLOOR To find out your arrival time please call [phone] between 1PM - 3PM on 09/06/15  Remember: Instructions that are not followed completely may result in serious medical risk, up to and including death, or upon the discretion of your surgeon and anesthesiologist your surgery may need to be rescheduled.    __X__ 1. Do not eat food or drink liquids after midnight. No gum chewing or hard candies.     __X__ 2. No Alcohol for 24 hours before or after surgery.   ____ 3. Bring all medications with you on the day of surgery if instructed.    __X__ 4. Notify your doctor if there is any change in your medical condition     (cold, fever, infections).     Do not wear jewelry, make-up, hairpins, clips or nail polish.  Do not wear lotions, powders, or perfumes. You may wear deodorant.  Do not shave 48 hours prior to surgery. Men may shave face and neck.  Do not bring valuables to the hospital.    [facility] is not responsible for any belongings or valuables.               Contacts, dentures or bridgework may not be worn into surgery.  Leave your suitcase in the car. After surgery it may be brought to your room.  For patients admitted to the hospital, discharge time is determined by your                treatment team.   Patients discharged the day of surgery will not be allowed to drive home.   Please read over the following fact sheets that you were given:   Surgical Site Infection Prevention   __X__ Take these medicines the morning of surgery with A SIP OF WATER:    1. FAMOTIDINE  2.   3.   4.  5.  6.  ____ Fleet Enema (as directed)   ____ Use CHG Soap as directed  ____ Use inhalers on the day of surgery  ____ Stop metformin 2 days prior to surgery    ____ Take 1/2 of usual insulin dose the night before surgery and none on the morning of surgery.   ____ Stop Coumadin/Plavix/aspirin  on  ____ Stop Anti-inflammatories on    ____ Stop supplements until after surgery.    ____ Bring C-Pap to the hospital.

## 2015-09-07 ENCOUNTER — Telehealth: Payer: Self-pay

## 2015-09-07 ENCOUNTER — Ambulatory Visit: Payer: 59 | Admitting: Anesthesiology

## 2015-09-07 ENCOUNTER — Ambulatory Visit
Admission: RE | Admit: 2015-09-07 | Discharge: 2015-09-07 | Disposition: A | Discharge status: Home / Self Care | Payer: 59 | Source: Ambulatory Visit | Attending: Urology | Admitting: Urology

## 2015-09-07 ENCOUNTER — Encounter
Admission: RE | Disposition: A | Discharge status: Home / Self Care | Payer: Self-pay | Source: Ambulatory Visit | Attending: Urology

## 2015-09-07 DIAGNOSIS — I10 Essential (primary) hypertension: Secondary | ICD-10-CM | POA: Insufficient documentation

## 2015-09-07 DIAGNOSIS — N200 Calculus of kidney: Secondary | ICD-10-CM | POA: Insufficient documentation

## 2015-09-07 DIAGNOSIS — K219 Gastro-esophageal reflux disease without esophagitis: Secondary | ICD-10-CM | POA: Diagnosis not present

## 2015-09-07 DIAGNOSIS — Z888 Allergy status to other drugs, medicaments and biological substances status: Secondary | ICD-10-CM | POA: Insufficient documentation

## 2015-09-07 DIAGNOSIS — Z87442 Personal history of urinary calculi: Secondary | ICD-10-CM | POA: Diagnosis not present

## 2015-09-07 DIAGNOSIS — Z8669 Personal history of other diseases of the nervous system and sense organs: Secondary | ICD-10-CM | POA: Diagnosis not present

## 2015-09-07 DIAGNOSIS — Z8249 Family history of ischemic heart disease and other diseases of the circulatory system: Secondary | ICD-10-CM | POA: Diagnosis not present

## 2015-09-07 DIAGNOSIS — Z9889 Other specified postprocedural states: Secondary | ICD-10-CM | POA: Diagnosis not present

## 2015-09-07 DIAGNOSIS — F17200 Nicotine dependence, unspecified, uncomplicated: Secondary | ICD-10-CM | POA: Diagnosis not present

## 2015-09-07 DIAGNOSIS — Z79899 Other long term (current) drug therapy: Secondary | ICD-10-CM | POA: Insufficient documentation

## 2015-09-07 HISTORY — PX: CYSTOSCOPY WITH STENT PLACEMENT: SHX5790

## 2015-09-07 HISTORY — PX: CYSTOSCOPY W/ RETROGRADES: SHX1426

## 2015-09-07 HISTORY — PX: URETEROSCOPY WITH HOLMIUM LASER LITHOTRIPSY: SHX6645

## 2015-09-07 HISTORY — DX: Unspecified osteoarthritis, unspecified site: M19.90

## 2015-09-07 SURGERY — URETEROSCOPY, WITH LITHOTRIPSY USING HOLMIUM LASER
Anesthesia: General | Laterality: Left | Wound class: Clean Contaminated

## 2015-09-07 MED ORDER — HYDROCODONE-ACETAMINOPHEN 5-325 MG PO TABS: 1.0000 | ORAL_TABLET | Freq: Four times a day (QID) | ORAL | Status: DC | PRN

## 2015-09-07 MED ORDER — IPRATROPIUM-ALBUTEROL 0.5-2.5 (3) MG/3ML IN SOLN
RESPIRATORY_TRACT | Status: AC
  Administered 2015-09-07: 3 mL
  Filled 2015-09-07: qty 3

## 2015-09-07 MED ORDER — PROPOFOL 10 MG/ML IV BOLUS
INTRAVENOUS | Status: DC | PRN
  Administered 2015-09-07: 200 mg via INTRAVENOUS

## 2015-09-07 MED ORDER — FENTANYL CITRATE (PF) 100 MCG/2ML IJ SOLN
INTRAMUSCULAR | Status: AC
  Filled 2015-09-07: qty 2

## 2015-09-07 MED ORDER — FENTANYL CITRATE (PF) 100 MCG/2ML IJ SOLN
INTRAMUSCULAR | Status: DC | PRN
  Administered 2015-09-07 (×4): 50 ug via INTRAVENOUS

## 2015-09-07 MED ORDER — SUCCINYLCHOLINE CHLORIDE 20 MG/ML IJ SOLN
INTRAMUSCULAR | Status: DC | PRN
  Administered 2015-09-07: 120 mg via INTRAVENOUS

## 2015-09-07 MED ORDER — OXYCODONE HCL 5 MG PO TABS: 5.0000 mg | ORAL_TABLET | Freq: Once | ORAL | Status: DC | PRN

## 2015-09-07 MED ORDER — MIDAZOLAM HCL 2 MG/2ML IJ SOLN
INTRAMUSCULAR | Status: DC | PRN
  Administered 2015-09-07 (×2): 1 mg via INTRAVENOUS

## 2015-09-07 MED ORDER — LIDOCAINE HCL (CARDIAC) 20 MG/ML IV SOLN
INTRAVENOUS | Status: DC | PRN
  Administered 2015-09-07: 30 mg via INTRAVENOUS

## 2015-09-07 MED ORDER — IPRATROPIUM-ALBUTEROL 0.5-2.5 (3) MG/3ML IN SOLN
RESPIRATORY_TRACT | Status: AC
  Administered 2015-09-07: 3 mL via RESPIRATORY_TRACT
  Filled 2015-09-07: qty 3

## 2015-09-07 MED ORDER — SODIUM CHLORIDE 0.9 % IV SOLN
INTRAVENOUS | Status: DC
  Administered 2015-09-07: 09:00:00 via INTRAVENOUS

## 2015-09-07 MED ORDER — NEOSTIGMINE METHYLSULFATE 10 MG/10ML IV SOLN
INTRAVENOUS | Status: DC | PRN
  Administered 2015-09-07: 3 mg via INTRAVENOUS

## 2015-09-07 MED ORDER — DEXAMETHASONE SODIUM PHOSPHATE 4 MG/ML IJ SOLN
INTRAMUSCULAR | Status: DC | PRN
  Administered 2015-09-07: 10 mg via INTRAVENOUS

## 2015-09-07 MED ORDER — ROCURONIUM BROMIDE 100 MG/10ML IV SOLN
INTRAVENOUS | Status: DC | PRN
  Administered 2015-09-07: 10 mg via INTRAVENOUS
  Administered 2015-09-07: 40 mg via INTRAVENOUS

## 2015-09-07 MED ORDER — IOTHALAMATE MEGLUMINE 43 % IV SOLN
INTRAVENOUS | Status: DC | PRN
  Administered 2015-09-07: 13 mL

## 2015-09-07 MED ORDER — IPRATROPIUM-ALBUTEROL 0.5-2.5 (3) MG/3ML IN SOLN
3.0000 mL | Freq: Once | RESPIRATORY_TRACT | Status: AC
  Administered 2015-09-07: 3 mL via RESPIRATORY_TRACT

## 2015-09-07 MED ORDER — CIPROFLOXACIN HCL 500 MG PO TABS: 500.0000 mg | ORAL_TABLET | Freq: Two times a day (BID) | ORAL | Status: DC

## 2015-09-07 MED ORDER — DOCUSATE SODIUM 100 MG PO CAPS: 100.0000 mg | ORAL_CAPSULE | Freq: Two times a day (BID) | ORAL | Status: DC

## 2015-09-07 MED ORDER — OXYCODONE HCL 5 MG/5ML PO SOLN: 5.0000 mg | Freq: Once | ORAL | Status: DC | PRN

## 2015-09-07 MED ORDER — GLYCOPYRROLATE 0.2 MG/ML IJ SOLN
INTRAMUSCULAR | Status: DC | PRN
  Administered 2015-09-07: 0.6 mg via INTRAVENOUS

## 2015-09-07 MED ORDER — FENTANYL CITRATE (PF) 100 MCG/2ML IJ SOLN
25.0000 ug | INTRAMUSCULAR | Status: DC | PRN
  Administered 2015-09-07 (×3): 50 ug via INTRAVENOUS

## 2015-09-07 MED ORDER — DEXTROSE 5 % IV SOLN
1.0000 g | INTRAVENOUS | Status: AC
  Administered 2015-09-07: 1 g via INTRAVENOUS
  Filled 2015-09-07: qty 10

## 2015-09-07 MED ORDER — ONDANSETRON HCL 4 MG/2ML IJ SOLN
INTRAMUSCULAR | Status: DC | PRN
  Administered 2015-09-07: 4 mg via INTRAVENOUS

## 2015-09-07 SURGICAL SUPPLY — 31 items
ADAPTER SCOPE UROLOK II (MISCELLANEOUS) ×3 IMPLANT
BACTOSHIELD CHG 4% 4OZ (MISCELLANEOUS) ×2
BASKET ZERO TIP 1.9FR (BASKET) IMPLANT
CATH URETL 5X70 OPEN END (CATHETERS) IMPLANT
CNTNR SPEC 2.5X3XGRAD LEK (MISCELLANEOUS) ×1
CONRAY 43 FOR UROLOGY 50M (MISCELLANEOUS) ×3 IMPLANT
CONT SPEC 4OZ STER OR WHT (MISCELLANEOUS) ×2
CONTAINER SPEC 2.5X3XGRAD LEK (MISCELLANEOUS) ×1 IMPLANT
FEE TECHNICIAN ONLY PER HOUR (MISCELLANEOUS) IMPLANT
GLOVE BIO SURGEON STRL SZ7 (GLOVE) ×6 IMPLANT
GLOVE BIO SURGEON STRL SZ7.5 (GLOVE) ×3 IMPLANT
GOWN STRL REUS W/ TWL LRG LVL3 (GOWN DISPOSABLE) ×1 IMPLANT
GOWN STRL REUS W/ TWL LRG LVL4 (GOWN DISPOSABLE) ×1 IMPLANT
GOWN STRL REUS W/TWL LRG LVL3 (GOWN DISPOSABLE) ×2
GOWN STRL REUS W/TWL LRG LVL4 (GOWN DISPOSABLE) ×2
GOWN STRL REUS W/TWL XL LVL3 (GOWN DISPOSABLE) ×3 IMPLANT
GUIDEWIRE SUPER STIFF (WIRE) IMPLANT
INTRODUCER DILATOR DOUBLE (INTRODUCER) ×3 IMPLANT
KIT RM TURNOVER CYSTO AR (KITS) ×3 IMPLANT
LASER HOLMIUM FIBER SU 272UM (MISCELLANEOUS) IMPLANT
PACK CYSTO AR (MISCELLANEOUS) ×3 IMPLANT
SCRUB CHG 4% DYNA-HEX 4OZ (MISCELLANEOUS) ×1 IMPLANT
SENSORWIRE 0.038 NOT ANGLED (WIRE) ×6
SET CYSTO W/LG BORE CLAMP LF (SET/KITS/TRAYS/PACK) ×3 IMPLANT
SHEATH URETERAL 12FR 45CM (SHEATH) ×3 IMPLANT
SHEATH URETERAL 13/15X36 1L (SHEATH) IMPLANT
STENT URET 6FRX24 CONTOUR (STENTS) IMPLANT
STENT URET 6FRX26 CONTOUR (STENTS) ×3 IMPLANT
SURGILUBE 2OZ TUBE FLIPTOP (MISCELLANEOUS) ×3 IMPLANT
SYRINGE IRR TOOMEY STRL 70CC (SYRINGE) ×3 IMPLANT
WIRE SENSOR 0.038 NOT ANGLED (WIRE) ×2 IMPLANT

## 2015-09-07 NOTE — H&P (View-Only) (Signed)
 08/23/2015 10:57 AM   Aaron Nichols 01/26/1971 [id number]  Referring provider: No referring provider defined for this encounter.  Chief Complaint  Patient presents with  . Nephrolithiasis    post op    HPI: Left renal calculus with previous right renal calculus with ureteral obstruction. The right renal calculus and ureteral calculus or gone post laser lithotripsy. Plan plan left ureteroscopy and left laser lithotripsy of his left calculus. He is complaining of nighttime discomfort especially when he turns on his side in the he wants this stone taken care of sooner than later.     PMH: Past Medical History  Diagnosis Date  . Brain tumor ([facility])     acoustic neuroma  . Hypertension   . Complication of anesthesia 2008    brain tumor 15 hours surgery resulting nausea and vomiting for severeal days  . PONV (postoperative nausea and vomiting)   . Kidney stone 1990  . GERD (gastroesophageal reflux disease)   . Abnormal LFTs (liver function tests)     Surgical History: Past Surgical History  Procedure Laterality Date  . Brain tumor excision    . Elbow fracture surgery Left 1998  . Ureteroscopy with holmium laser lithotripsy Right 07/06/2015    Procedure: URETEROSCOPY ;  Surgeon: Paiton Boultinghouse D Jaeleen Inzunza, MD;  Location: [facility];  Service: Urology;  Laterality: Right;  . Cystoscopy with stent placement Right 07/06/2015    Procedure: CYSTOSCOPY WITH STENT PLACEMENT;  Surgeon: Jalexis Breed D Dawnell Bryant, MD;  Location: [facility] OR[facility];  Service: Urology;  Laterality: Right;  . Ureteroscopy with holmium laser lithotripsy Right 07/25/2015    Procedure: URETEROSCOPY WITH HOLMIUM LASER LITHOTRIPSY, CYSTOSCOPY,  STENT REMOVAL ;  Surgeon: Cato Liburd D Karliah Kowalchuk, MD;  Location: [facility];  Service: Urology;  Laterality: Right;    Home Medications:    Medication List       This list is accurate as of: 08/23/15 10:57 AM.  Always use your most recent med list.               famotidine 10 MG tablet  Commonly known as:   PEPCID  Take 10 mg by mouth 2 (two) times daily.     lisinopril 20 MG tablet  Commonly known as:  PRINIVIL,ZESTRIL  Take 20 mg by mouth daily.     multivitamin with minerals tablet  Take 1 tablet by mouth daily.        Allergies:  Allergies  Allergen Reactions  . Ceftin [Cefuroxime Axetil]     Dizziness,lightheaded    Family History: Family History  Problem Relation Age of Onset  . Heart disease Father     Social History:  reports that he has been smoking.  He does not have any smokeless tobacco history on file. He reports that he does not drink alcohol or use illicit drugs.  ROS: UROLOGY Frequent Urination?: No Hard to postpone urination?: No Burning/pain with urination?: No Get up at night to urinate?: No Leakage of urine?: No Urine stream starts and stops?: No Trouble starting stream?: No Do you have to strain to urinate?: No Blood in urine?: No Urinary tract infection?: No Sexually transmitted disease?: No Injury to kidneys or bladder?: No Painful intercourse?: No Weak stream?: No Erection problems?: No Penile pain?: No  Gastrointestinal Nausea?: No Vomiting?: No Indigestion/heartburn?: No Diarrhea?: No Constipation?: No  Constitutional Fever: No Night sweats?: No Weight loss?: No Fatigue?: No  Skin Skin rash/lesions?: No Itching?: No  Eyes Blurred vision?: No Double vision?: No  Ears/Nose/Throat Sore   throat?: No Sinus problems?: No  Hematologic/Lymphatic Swollen glands?: No Easy bruising?: No  Cardiovascular Leg swelling?: No Chest pain?: No  Respiratory Cough?: No Shortness of breath?: No  Endocrine Excessive thirst?: No  Musculoskeletal Back pain?: Yes Joint pain?: No  Neurological Headaches?: No Dizziness?: No  Psychologic Depression?: No Anxiety?: No  Physical Exam: BP 185/114 mmHg  Pulse 92  Ht 5' 10" (1.778 m)  Wt 227 lb (102.967 kg)  BMI 32.57 kg/m2  Constitutional:  Alert and oriented, No acute  distress. HEENT: [place] AT, moist mucus membranes.  Trachea midline, no masses. Cardiovascular: No clubbing, cyanosis, or edema. Respiratory: Normal respiratory effort, no increased work of breathing. GI: Abdomen is soft, nontender, nondistended, no abdominal masses GU: No CVA tenderness. Intermittent evening pain and low back Skin: No rashes, bruises or suspicious lesions. Lymph: No cervical or inguinal adenopathy. Neurologic: Grossly intact, no focal deficits, moving all 4 extremities. Psychiatric: Normal mood and affect.  Laboratory Data: Lab Results  Component Value Date   WBC 10.1 06/20/2015   HGB 14.0 06/20/2015   HCT 41.9 06/20/2015   MCV 90.1 06/20/2015   PLT 245 06/20/2015    Lab Results  Component Value Date   CREATININE 0.92 06/20/2015    No results found for: PSA  No results found for: TESTOSTERONE  No results found for: HGBA1C  Urinalysis    Component Value Date/Time   COLORURINE YELLOW 06/25/2015 0921   APPEARANCEUR CLEAR 06/25/2015 0921   LABSPEC <1.005* 06/25/2015 0921   PHURINE 5.0 06/25/2015 0921   GLUCOSEU Negative 07/12/2015 0931   HGBUR 3+* 06/25/2015 0921   BILIRUBINUR Negative 07/12/2015 0931   BILIRUBINUR NEGATIVE 06/25/2015 0921   KETONESUR NEGATIVE 06/25/2015 0921   PROTEINUR NEGATIVE 06/25/2015 0921   NITRITE Negative 07/12/2015 0931   NITRITE NEGATIVE 06/25/2015 0921   LEUKOCYTESUR 2+* 07/12/2015 0931   LEUKOCYTESUR NEGATIVE 06/25/2015 0921    Pertinent Imaging: KUB  Assessment & Plan:  An 11 mm lower pole left renal calculus with intermittent obstruction especially at night and discomfort from this. She is right ureteroscopy was successful. His left side ureteroscopy. I cannot visualize the stone on KUB. Therefore he will need ureteroscopy laser lithotripsy was commenced.  1 He has KUB with no 11 mm calculus is visualized although it's been seen on ultrasound. Renal stone  - Urinalysis, Complete - CULTURE, URINE COMPREHENSIVE   No  Follow-up on file.  Ameli Sangiovanni D Kyli Sorter, MD  [facility] [address], [address] [place], [place] [address] [phone]    

## 2015-09-07 NOTE — Op Note (Signed)
Preoperative diagnosis:  1.  Left renal stone  Postoperative diagnosis:  1. Left renal stone   Procedure: 1. Cystoscopy 2. Left retrograde pyelogram with interpretation 3. Attempted left ureteroscopy 4. Left ureteral stent placement (6 [place] by 26 cm)  Surgeon: Baruch Gouty, MD  Anesthesia: General  Complications: None  Intraoperative findings: A left retrograde pyelogram showed a filling defect in the inferior pole consistent with known stone. Fluoroscopy of the procedure showed correct stent placement. During the procedure, or unable to navigate the flexible ureteroscope into the left renal pelvis secondary to the patient's narrow left ureter.  EBL: Minimal  Specimens: None  Indication: Aaron Nichols is a 44 y.o. patient with a left renal stone presenting for definitive management.  After reviewing the management options for treatment, he elected to proceed with the above surgical procedure(s). We have discussed the potential benefits and risks of the procedure, side effects of the proposed treatment, the likelihood of the patient achieving the goals of the procedure, and any potential problems that might occur during the procedure or recuperation. Informed consent has been obtained.  Description of procedure:  The patient was taken to the operating room and general anesthesia was induced.  The patient was placed in the dorsal lithotomy position, prepped and draped in the usual sterile fashion, and preoperative antibiotics were administered. A preoperative time-out was performed. The patient was then placed in dorsal lithotomy position. He was prepped and draped in the usual sterile fashion. A timeout was called. A 21 French 30 cystoscope was inserted into the patient's bladder atraumatically. The left ureteral orifice was visualized. A sensor wire was introduced into the left ureter orifice. At this time it was noted that the ureteral orifice was very narrow. With some difficulty, a  dual-lumen open-ended catheter was placed in the left ureter. A retrograde pyelogram was obtained showing the known stone in the left inferior pole of the kidney. A second sensor wire was then placed through the dual-lumen catheter to the level of the renal pelvis. Ureteroscope and dual-lumen catheter and then removed. A ureteral access sheath then placed over one of the sensor wires. However, due to resistance the ureteral access sheath can only be navigated to the mid ureter. The second sensor wire was removed. An attempt at left flexible ureteroscopy through this was unsuccessful as the ureteroscope was not long enough to reach the renal pelvis with the ureteral access sheath only partially in the ureter. The access sheath was removed. A second sensor wire again was placed through the dual-lumen catheter under fluoroscopy with first sensor wire as the guide. The dual-lumen catheter was removed. Over one of the sensor wires, an attempt was made to directly place the flexible ureteroscope into the left ureter. We were unable to successfully navigated past the very narrow ureteral orifice. At this time, it was decided to place a ureteral stent and allow passive dilation of the ureter. The cystoscope was assembled over the remaining sensor wire and inserted into the bladder. 6 [place] by 26 cm double-J ureteral stent was then placed over the sensor wire. The sensor wire was removed. Placement of stent was confirmed in the correct position with the curl seen in the patient's left upper pole on fluoroscopy and a curl seen in the patient's urinary bladder under visualization. The patient's bladder was drained at this time. He was awoken from anesthesia and transferred in stable condition to the postanesthesia care unit.  Plan: The patient will be scheduled for repeat ureteroscopy in  a few weeks after passive dilation of his ureter by the ureteral stent.   Baruch Gouty, M.D.

## 2015-09-07 NOTE — Telephone Encounter (Signed)
-----   Message from Nickie Retort, MD sent at 09/07/2015 10:31 AM EST ----- Aaron Nichols needs to be scheduled for cysto, left ureteroscopy, laser lithotripsy, stone removal, left stent exchange in a few weeks.   I was unable to reach his stone.  Will allow ureter to passively dilate and reattempt in a few weeks. Thanks

## 2015-09-07 NOTE — Transfer of Care (Signed)
Immediate Anesthesia Transfer of Care Note  Patient: Freddrick March  Procedure(s) Performed: Procedure(s): URETEROSCOPY WITH HOLMIUM LASER LITHOTRIPSY (Left) CYSTOSCOPY WITH RETROGRADE PYELOGRAM (Left) CYSTOSCOPY WITH STENT PLACEMENT (Left)  Patient Location: PACU  Anesthesia Type:General  Level of Consciousness: awake, oriented and patient cooperative  Airway & Oxygen Therapy: Patient Spontanous Breathing and Patient connected to face mask oxygen  Post-op Assessment: Report given to RN and Post -op Vital signs reviewed and stable  Post vital signs: Reviewed and stable  Last Vitals:  Filed Vitals:   09/07/15 1038  BP: 126/65  Pulse: 91  Temp: 35.9 C  Resp: 6    Complications: No apparent anesthesia complications

## 2015-09-07 NOTE — Discharge Instructions (Addendum)

## 2015-09-07 NOTE — Anesthesia Procedure Notes (Signed)
Procedure Name: Intubation Date/Time: 09/07/2015 9:46 AM Performed by: Doreen Salvage Pre-anesthesia Checklist: Patient identified, Patient being monitored, Timeout performed, Emergency Drugs available and Suction available Patient Re-evaluated:Patient Re-evaluated prior to inductionOxygen Delivery Method: Circle system utilized Preoxygenation: Pre-oxygenation with 100% oxygen Intubation Type: IV induction and Cricoid Pressure applied Ventilation: Mask ventilation without difficulty Laryngoscope Size: Mac and 4 Grade View: Grade II Tube type: Oral Tube size: 7.5 mm Number of attempts: 2 Airway Equipment and Method: Stylet Placement Confirmation: ETT inserted through vocal cords under direct vision,  positive ETCO2 and breath sounds checked- equal and bilateral Secured at: 21 cm Tube secured with: Tape Dental Injury: Teeth and Oropharynx as per pre-operative assessment  Comments: Intubated  Per Dr. Amie Critchley

## 2015-09-07 NOTE — Anesthesia Preprocedure Evaluation (Addendum)
Anesthesia Evaluation  Patient identified by MRN, date of birth, ID band Patient awake    Reviewed: Allergy & Precautions, H&P , NPO status , Patient's Chart, lab work & pertinent test results  History of Anesthesia Complications (+) PONV and history of anesthetic complications  Airway Mallampati: III  TM Distance: <3 FB Neck ROM: full    Dental no notable dental hx. (+) Teeth Intact   Pulmonary neg shortness of breath, Current Smoker,    Pulmonary exam normal breath sounds clear to auscultation       Cardiovascular Exercise Tolerance: Good hypertension, (-) angina(-) Past MI and (-) DOE Normal cardiovascular exam Rhythm:regular Rate:Normal     Neuro/Psych negative neurological ROS  negative psych ROS   GI/Hepatic negative GI ROS, Neg liver ROS, GERD  Controlled,  Endo/Other  negative endocrine ROS  Renal/GU Renal disease  negative genitourinary   Musculoskeletal   Abdominal   Peds  Hematology negative hematology ROS (+)   Anesthesia Other Findings Past Medical History:   Brain tumor ([place])                                              Comment:acoustic neuroma   Hypertension                                                 Complication of anesthesia                      2008           Comment:brain tumor 15 hours surgery resulting nausea               and vomiting for severeal days   PONV (postoperative nausea and vomiting)                     Kidney stone                                    1990         GERD (gastroesophageal reflux disease)                       Abnormal LFTs (liver function tests)                         Arthritis                                                   Past Surgical History:   BRAIN TUMOR EXCISION                                          ELBOW FRACTURE SURGERY                          Left 1998  URETEROSCOPY WITH HOLMIUM LASER LITHOTRIPSY     Right 07/06/2015   Comment:Procedure: URETEROSCOPY ;  Surgeon: Collier Flowers, MD;  Location: [facility] ORS;  Service:               Urology;  Laterality: Right;   CYSTOSCOPY WITH STENT PLACEMENT                 Right 07/06/2015      Comment:Procedure: CYSTOSCOPY WITH STENT PLACEMENT;                Surgeon: Collier Flowers, MD;  Location: [facility]               ORS;  Service: Urology;  Laterality: Right;   URETEROSCOPY WITH HOLMIUM LASER LITHOTRIPSY     Right 07/25/2015      Comment:Procedure: URETEROSCOPY WITH HOLMIUM LASER               LITHOTRIPSY, CYSTOSCOPY,  STENT REMOVAL ;                Surgeon: Collier Flowers, MD;  Location: [facility]               ORS;  Service: Urology;  Laterality: Right;  BMI    Body Mass Index   32.28 kg/m 2    Right sided facial weakness and numbness from brain surgery    Reproductive/Obstetrics negative OB ROS                            Anesthesia Physical Anesthesia Plan  ASA: III  Anesthesia Plan: General ETT   Post-op Pain Management:    Induction:   Airway Management Planned:   Additional Equipment:   Intra-op Plan:   Post-operative Plan:   Informed Consent: I have reviewed the patients History and Physical, chart, labs and discussed the procedure including the risks, benefits and alternatives for the proposed anesthesia with the patient or authorized representative who has indicated his/her understanding and acceptance.   Dental Advisory Given  Plan Discussed with: Anesthesiologist, CRNA and Surgeon  Anesthesia Plan Comments:        Anesthesia Quick Evaluation

## 2015-09-07 NOTE — Anesthesia Postprocedure Evaluation (Signed)
  Anesthesia Post-op Note  Patient: Aaron Nichols  Procedure(s) Performed: Procedure(s): URETEROSCOPY (Left) CYSTOSCOPY WITH RETROGRADE PYELOGRAM (Left) CYSTOSCOPY WITH STENT PLACEMENT (Left)  Anesthesia type:General ETT  Patient location: PACU  Post pain: Pain level controlled  Post assessment: Post-op Vital signs reviewed, Patient's Cardiovascular Status Stable, Respiratory Function Stable, Patent Airway and No signs of Nausea or vomiting  Post vital signs: Reviewed and stable  Last Vitals:  Filed Vitals:   09/07/15 1139  BP: 106/54  Pulse: 86  Temp:   Resp: 18    Level of consciousness: awake, alert  and patient cooperative  Complications: No apparent anesthesia complications

## 2015-09-07 NOTE — Interval H&P Note (Signed)
History and Physical Interval Note:  09/07/2015 8:58 AM  Aaron Nichols  has presented today for surgery, with the diagnosis of LEFT RENAL STONE  The various methods of treatment have been discussed with the patient and family. After consideration of risks, benefits and other options for treatment, the patient has consented to  Procedure(s): URETEROSCOPY WITH HOLMIUM LASER LITHOTRIPSY (Left) CYSTOSCOPY WITH RETROGRADE PYELOGRAM (Left) CYSTOSCOPY WITH STENT PLACEMENT (Left) as a surgical intervention .  The patient's history has been reviewed, patient examined, no change in status, stable for surgery.  I have reviewed the patient's chart and labs.  Questions were answered to the patient's satisfaction.    RRR Unlabored respirations  Horald Pollen [place]

## 2015-09-22 ENCOUNTER — Ambulatory Visit (INDEPENDENT_AMBULATORY_CARE_PROVIDER_SITE_OTHER): Payer: 59 | Admitting: Urology

## 2015-09-22 ENCOUNTER — Encounter: Payer: Self-pay | Admitting: Urology

## 2015-09-22 VITALS — BP 125/77 | HR 85 | Ht 70.0 in | Wt 226.2 lb

## 2015-09-22 DIAGNOSIS — N200 Calculus of kidney: Secondary | ICD-10-CM

## 2015-09-22 LAB — MICROSCOPIC EXAMINATION

## 2015-09-22 LAB — URINALYSIS, COMPLETE
Bilirubin, UA: NEGATIVE
GLUCOSE, UA: NEGATIVE
Ketones, UA: NEGATIVE
Nitrite, UA: NEGATIVE
PH UA: 5.5 (ref 5.0–7.5)
Specific Gravity, UA: >1.03 — ABNORMAL HIGH (ref 1.005–1.030)
Urobilinogen, Ur: 0.2 mg/dL (ref 0.2–1.0)

## 2015-09-22 NOTE — Progress Notes (Signed)
09/22/2015 9:08 AM   Aaron Nichols 12-Dec-1970 [id number]  Referring provider: No referring provider defined for this encounter.  Chief Complaint  Patient presents with  . Post-op Problem    pt needs to be scheduled for Ureteroscopy     HPI: The patient is a 44 year old male with a left lower pole stone who underwent an attempted ureteroscopy a few weeks ago but his ureter was too narrow to accommodate the ureteroscope. A left renal stent was placed for passive dilation. The patient follows up today to discuss definitive surgical mesh. He is tolerating the stent well. He does have some flank pain with urination but other than that he has no problems with stent.    PMH: Past Medical History  Diagnosis Date  . 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 Hypertension   . Complication of anesthesia 2008    brain tumor 15 hours surgery resulting nausea and vomiting for severeal days  . PONV (postoperative nausea and vomiting)   . Kidney stone 1990  . GERD (gastroesophageal reflux disease)   . Abnormal LFTs (liver function tests)   . Arthritis   . BP (high blood pressure) 01/26/2015  . Infection of the upper respiratory tract 04/22/2015  . Right ureteral stone 07/03/2015  . Hydronephrosis with urinary obstruction due to ureteral calculus 07/03/2015    Surgical History: Past Surgical History  Procedure Laterality Date  . Brain tumor excision    . Elbow fracture surgery Left 1998  . Ureteroscopy with holmium laser lithotripsy Right 07/06/2015    Procedure: URETEROSCOPY ;  Surgeon: Collier Flowers, MD;  Location: [facility];  Service: Urology;  Laterality: Right;  . Cystoscopy with stent placement Right 07/06/2015    Procedure: CYSTOSCOPY WITH STENT PLACEMENT;  Surgeon: Collier Flowers, MD;  Location: [facility];  Service: Urology;  Laterality: Right;  . Ureteroscopy with holmium laser lithotripsy Right 07/25/2015    Procedure: URETEROSCOPY WITH HOLMIUM LASER LITHOTRIPSY, CYSTOSCOPY,  STENT  REMOVAL ;  Surgeon: Collier Flowers, MD;  Location: [facility];  Service: Urology;  Laterality: Right;  . Ureteroscopy with holmium laser lithotripsy Left 09/07/2015    Procedure: URETEROSCOPY;  Surgeon: Nickie Retort, MD;  Location: [facility];  Service: Urology;  Laterality: Left;  . Cystoscopy w/ retrogrades Left 09/07/2015    Procedure: CYSTOSCOPY WITH RETROGRADE PYELOGRAM;  Surgeon: Nickie Retort, MD;  Location: [facility];  Service: Urology;  Laterality: Left;  . Cystoscopy with stent placement Left 09/07/2015    Procedure: CYSTOSCOPY WITH STENT PLACEMENT;  Surgeon: Nickie Retort, MD;  Location: [facility];  Service: Urology;  Laterality: Left;    Home Medications:    Medication List       This list is accurate as of: 09/22/15  9:08 AM.  Always use your most recent med list.               acetaminophen 500 MG tablet  Commonly known as:  TYLENOL  Take 1,000 mg by mouth daily.     amLODipine 2.5 MG tablet  Commonly known as:  NORVASC  Take 2.5 mg by mouth daily.     ciprofloxacin 500 MG tablet  Commonly known as:  CIPRO  Take 1 tablet (500 mg total) by mouth 2 (two) times daily.     docusate sodium 100 MG capsule  Commonly known as:  COLACE  Take 1 capsule (100 mg total) by mouth 2 (two) times daily.     famotidine  10 MG tablet  Commonly known as:  PEPCID  Take 10 mg by mouth 2 (two) times daily.     HYDROcodone-acetaminophen 5-325 MG tablet  Commonly known as:  NORCO  Take 1 tablet by mouth every 6 (six) hours as needed for moderate pain.     lisinopril 20 MG tablet  Commonly known as:  PRINIVIL,ZESTRIL  Take 20 mg by mouth daily.     multivitamin with minerals tablet  Take 1 tablet by mouth daily.        Allergies:  Allergies  Allergen Reactions  . Ceftin [Cefuroxime Axetil]     Dizziness,lightheaded    Family History: Family History  Problem Relation Age of Onset  . Heart disease Father     Social History:  reports that he has been  smoking.  He has never used smokeless tobacco. He reports that he does not drink alcohol or use illicit drugs.  ROS: UROLOGY Frequent Urination?: No Hard to postpone urination?: No Burning/pain with urination?: Yes Get up at night to urinate?: No Leakage of urine?: No Urine stream starts and stops?: No Trouble starting stream?: No Do you have to strain to urinate?: No Blood in urine?: Yes Urinary tract infection?: No Sexually transmitted disease?: No Injury to kidneys or bladder?: No Painful intercourse?: No Weak stream?: No Erection problems?: No Penile pain?: No  Gastrointestinal Nausea?: No Vomiting?: No Indigestion/heartburn?: No Diarrhea?: No Constipation?: No  Constitutional Fever: No Night sweats?: No Weight loss?: No Fatigue?: No  Skin Skin rash/lesions?: No Itching?: No  Eyes Blurred vision?: No Double vision?: No  Ears/Nose/Throat Sore throat?: No Sinus problems?: No  Hematologic/Lymphatic Swollen glands?: No Easy bruising?: No  Cardiovascular Leg swelling?: No Chest pain?: No  Respiratory Cough?: No Shortness of breath?: No  Endocrine Excessive thirst?: No  Musculoskeletal Back pain?: No Joint pain?: No  Neurological Headaches?: No Dizziness?: No  Psychologic Depression?: No Anxiety?: No  Physical Exam: BP 125/77 mmHg  Pulse 85  Ht 5\' 10"  (1.778 m)  Wt 226 lb 3.2 oz (102.604 kg)  BMI 32.46 kg/m2  Constitutional:  Alert and oriented, No acute distress. HEENT: [place] AT, moist mucus membranes.  Trachea midline, no masses. Cardiovascular: No clubbing, cyanosis, or edema. Respiratory: Normal respiratory effort, no increased work of breathing. GI: Abdomen is soft, nontender, nondistended, no abdominal masses GU: No CVA tenderness.  Skin: No rashes, bruises or suspicious lesions. Lymph: No cervical or inguinal adenopathy. Neurologic: Grossly intact, no focal deficits, moving all 4 extremities. Psychiatric: Normal mood and  affect.  Laboratory Data: Lab Results  Component Value Date   WBC 10.1 06/20/2015   HGB 14.0 06/20/2015   HCT 41.9 06/20/2015   MCV 90.1 06/20/2015   PLT 245 06/20/2015    Lab Results  Component Value Date   CREATININE 0.92 06/20/2015    No results found for: PSA  No results found for: TESTOSTERONE  No results found for: HGBA1C  Urinalysis    Component Value Date/Time   COLORURINE YELLOW 06/25/2015 0921   APPEARANCEUR CLEAR 06/25/2015 0921   LABSPEC <1.005* 06/25/2015 0921   PHURINE 5.0 06/25/2015 0921   GLUCOSEU Negative 08/23/2015 0924   HGBUR 3+* 06/25/2015 0921   BILIRUBINUR Negative 08/23/2015 0924   BILIRUBINUR NEGATIVE 06/25/2015 0921   KETONESUR NEGATIVE 06/25/2015 0921   PROTEINUR NEGATIVE 06/25/2015 0921   NITRITE Negative 08/23/2015 0924   NITRITE NEGATIVE 06/25/2015 0921   LEUKOCYTESUR Negative 08/23/2015 0924   LEUKOCYTESUR NEGATIVE 06/25/2015 0921     Assessment & Plan:  1. Left renal stone with previous left ureteral stent We'll set the patient up for cystoscopy, left ureteroscopy, left retrograde pyelogram, laser lithotripsy, stone basketing, and left ureteral stent exchange. He understands the risks, benefits, indications of surgery. All questions were answered. The patient is agreeable to proceeding.   Return for surgery.  Nickie Retort, MD  [facility] [address], [place], [place] [address] [phone]

## 2015-09-23 ENCOUNTER — Telehealth: Payer: Self-pay | Admitting: Radiology

## 2015-09-23 NOTE — Telephone Encounter (Signed)
Pt notified of surgery scheduled 10/03/15, pre-admit testing appt 09/27/15 @12 :00 and to call Friday prior to surgery for arrival time to SDS. Pt voices understanding.

## 2015-09-24 LAB — CULTURE, URINE COMPREHENSIVE

## 2015-09-27 ENCOUNTER — Encounter
Admission: RE | Admit: 2015-09-27 | Discharge: 2015-09-27 | Disposition: A | Discharge status: Home / Self Care | Payer: 59 | Source: Ambulatory Visit | Attending: Urology | Admitting: Urology

## 2015-09-27 DIAGNOSIS — Z01810 Encounter for preprocedural cardiovascular examination: Secondary | ICD-10-CM | POA: Insufficient documentation

## 2015-09-27 HISTORY — DX: Bell's palsy: G51.0

## 2015-09-27 LAB — DIFFERENTIAL
Basophils Absolute: 0.1 10*3/uL (ref 0–0.1)
Basophils Relative: 1 %
EOS ABS: 0.6 10*3/uL (ref 0–0.7)
Lymphocytes Relative: 37 %
Lymphs Abs: 4.1 10*3/uL — ABNORMAL HIGH (ref 1.0–3.6)
Monocytes Absolute: 0.7 10*3/uL (ref 0.2–1.0)
Monocytes Relative: 7 %
Neutro Abs: 5.5 10*3/uL (ref 1.4–6.5)

## 2015-09-27 LAB — CBC
HCT: 41.5 % (ref 40.0–52.0)
Hemoglobin: 13.9 g/dL (ref 13.0–18.0)
MCH: 30.1 pg (ref 26.0–34.0)
MCHC: 33.5 g/dL (ref 32.0–36.0)
MCV: 89.8 fL (ref 80.0–100.0)
PLATELETS: 300 10*3/uL (ref 150–440)
RBC: 4.62 MIL/uL (ref 4.40–5.90)
RDW: 14.5 % (ref 11.5–14.5)
WBC: 10.9 10*3/uL — ABNORMAL HIGH (ref 3.8–10.6)

## 2015-09-27 MED ORDER — IPRATROPIUM-ALBUTEROL 0.5-2.5 (3) MG/3ML IN SOLN: 3.0000 mL | RESPIRATORY_TRACT | Status: DC

## 2015-09-27 NOTE — Patient Instructions (Signed)
  Your procedure is scheduled on: October 03, 2015 (Monday)  Report to Day Surgery.[facility]) To find out your arrival time please call [phone] between 1PM - 3PM on September 30, 2015 (Friday).  Remember: Instructions that are not followed completely may result in serious medical risk, up to and including death, or upon the discretion of your surgeon and anesthesiologist your surgery may need to be rescheduled.    __x__ 1. Do not eat food or drink liquids after midnight. No gum chewing or hard candies.     ____ 2. No Alcohol for 24 hours before or after surgery.   ____ 3. Bring all medications with you on the day of surgery if instructed.    __x__ 4. Notify your doctor if there is any change in your medical condition     (cold, fever, infections).     Do not wear jewelry, make-up, hairpins, clips or nail polish.  Do not wear lotions, powders, or perfumes. You may wear deodorant.  Do not shave 48 hours prior to surgery. Men may shave face and neck.  Do not bring valuables to the hospital.    [facility] is not responsible for any belongings or valuables.               Contacts, dentures or bridgework may not be worn into surgery.  Leave your suitcase in the car. After surgery it may be brought to your room.  For patients admitted to the hospital, discharge time is determined by your                treatment team.   Patients discharged the day of surgery will not be allowed to drive home.   Please read over the following fact sheets that you were given:      _x___ Take these medicines the morning of surgery with A SIP OF WATER:    1. Pepcid  2.   3.   4.  5.  6.  ____ Fleet Enema (as directed)   ____ Use CHG Soap as directed  ____ Use inhalers on the day of surgery  ____ Stop metformin 2 days prior to surgery    ____ Take 1/2 of usual insulin dose the night before surgery and none on the morning of surgery.   ____ Stop Coumadin/Plavix/aspirin on   __x__ Stop  Anti-inflammatories on (TYLENOL OK TO TAKE FOR PAIN IF NEEDED)   ____ Stop supplements until after surgery.    ____ Bring C-Pap to the hospital.

## 2015-09-29 LAB — URINE CULTURE: Culture: NO GROWTH

## 2015-10-03 ENCOUNTER — Ambulatory Visit
Admission: RE | Admit: 2015-10-03 | Discharge: 2015-10-03 | Disposition: A | Discharge status: Home / Self Care | Payer: 59 | Source: Ambulatory Visit | Attending: Urology | Admitting: Urology

## 2015-10-03 ENCOUNTER — Telehealth: Payer: Self-pay

## 2015-10-03 ENCOUNTER — Ambulatory Visit: Payer: 59 | Admitting: Anesthesiology

## 2015-10-03 ENCOUNTER — Encounter: Payer: Self-pay | Admitting: *Deleted

## 2015-10-03 ENCOUNTER — Encounter
Admission: RE | Disposition: A | Discharge status: Home / Self Care | Payer: Self-pay | Source: Ambulatory Visit | Attending: Urology

## 2015-10-03 DIAGNOSIS — K219 Gastro-esophageal reflux disease without esophagitis: Secondary | ICD-10-CM | POA: Insufficient documentation

## 2015-10-03 DIAGNOSIS — Z881 Allergy status to other antibiotic agents status: Secondary | ICD-10-CM | POA: Insufficient documentation

## 2015-10-03 DIAGNOSIS — Z87442 Personal history of urinary calculi: Secondary | ICD-10-CM | POA: Insufficient documentation

## 2015-10-03 DIAGNOSIS — F17200 Nicotine dependence, unspecified, uncomplicated: Secondary | ICD-10-CM | POA: Diagnosis not present

## 2015-10-03 DIAGNOSIS — N200 Calculus of kidney: Secondary | ICD-10-CM | POA: Diagnosis not present

## 2015-10-03 DIAGNOSIS — Z79899 Other long term (current) drug therapy: Secondary | ICD-10-CM | POA: Insufficient documentation

## 2015-10-03 DIAGNOSIS — Z86018 Personal history of other benign neoplasm: Secondary | ICD-10-CM | POA: Insufficient documentation

## 2015-10-03 DIAGNOSIS — I10 Essential (primary) hypertension: Secondary | ICD-10-CM | POA: Insufficient documentation

## 2015-10-03 DIAGNOSIS — Z9889 Other specified postprocedural states: Secondary | ICD-10-CM | POA: Diagnosis not present

## 2015-10-03 DIAGNOSIS — Z8249 Family history of ischemic heart disease and other diseases of the circulatory system: Secondary | ICD-10-CM | POA: Insufficient documentation

## 2015-10-03 HISTORY — PX: CYSTOSCOPY W/ RETROGRADES: SHX1426

## 2015-10-03 HISTORY — PX: CYSTOSCOPY WITH STENT PLACEMENT: SHX5790

## 2015-10-03 HISTORY — PX: URETEROSCOPY WITH HOLMIUM LASER LITHOTRIPSY: SHX6645

## 2015-10-03 LAB — BASIC METABOLIC PANEL
ANION GAP: 8 (ref 5–15)
BUN: 13 mg/dL (ref 6–20)
CHLORIDE: 107 mmol/L (ref 101–111)
CO2: 25 mmol/L (ref 22–32)
Calcium: 9.6 mg/dL (ref 8.9–10.3)
Creatinine, Ser: 0.99 mg/dL (ref 0.61–1.24)
GFR calc Af Amer: >60 mL/min (ref >60)
GFR calc non Af Amer: >60 mL/min (ref >60)
GLUCOSE: 104 mg/dL — AB (ref 65–99)
POTASSIUM: 4.5 mmol/L (ref 3.5–5.1)
Sodium: 140 mmol/L (ref 135–145)

## 2015-10-03 SURGERY — URETEROSCOPY, WITH LITHOTRIPSY USING HOLMIUM LASER
Anesthesia: General | Laterality: Left | Wound class: Clean Contaminated

## 2015-10-03 MED ORDER — NEOSTIGMINE METHYLSULFATE 10 MG/10ML IV SOLN
INTRAVENOUS | Status: DC | PRN
  Administered 2015-10-03: 5 mg via INTRAVENOUS

## 2015-10-03 MED ORDER — IOTHALAMATE MEGLUMINE 43 % IV SOLN
INTRAVENOUS | Status: DC | PRN
  Administered 2015-10-03: 15 mL

## 2015-10-03 MED ORDER — KETOROLAC TROMETHAMINE 30 MG/ML IJ SOLN
INTRAMUSCULAR | Status: DC | PRN
  Administered 2015-10-03: 15 mg via INTRAVENOUS

## 2015-10-03 MED ORDER — OXYCODONE HCL 5 MG PO TABS
5.0000 mg | ORAL_TABLET | Freq: Once | ORAL | Status: AC | PRN
  Administered 2015-10-03: 5 mg via ORAL

## 2015-10-03 MED ORDER — CIPROFLOXACIN HCL 500 MG PO TABS
ORAL_TABLET | ORAL | Status: AC
  Administered 2015-10-03: 500 mg via ORAL
  Filled 2015-10-03: qty 1

## 2015-10-03 MED ORDER — CIPROFLOXACIN HCL 500 MG PO TABS
500.0000 mg | ORAL_TABLET | Freq: Once | ORAL | Status: AC
  Administered 2015-10-03: 500 mg via ORAL

## 2015-10-03 MED ORDER — HYDROCODONE-ACETAMINOPHEN 5-325 MG PO TABS: 1.0000 | ORAL_TABLET | Freq: Four times a day (QID) | ORAL | Status: DC | PRN

## 2015-10-03 MED ORDER — FENTANYL CITRATE (PF) 100 MCG/2ML IJ SOLN
25.0000 ug | INTRAMUSCULAR | Status: DC | PRN
  Administered 2015-10-03 (×4): 25 ug via INTRAVENOUS

## 2015-10-03 MED ORDER — FENTANYL CITRATE (PF) 100 MCG/2ML IJ SOLN
INTRAMUSCULAR | Status: DC | PRN
  Administered 2015-10-03: 250 ug via INTRAVENOUS

## 2015-10-03 MED ORDER — LIDOCAINE HCL (CARDIAC) 20 MG/ML IV SOLN
INTRAVENOUS | Status: DC | PRN
  Administered 2015-10-03: 40 mg via INTRAVENOUS

## 2015-10-03 MED ORDER — SUCCINYLCHOLINE CHLORIDE 20 MG/ML IJ SOLN
INTRAMUSCULAR | Status: DC | PRN
  Administered 2015-10-03: 100 mg via INTRAVENOUS

## 2015-10-03 MED ORDER — LIDOCAINE HCL (PF) 4 % IJ SOLN
INTRAMUSCULAR | Status: DC | PRN
  Administered 2015-10-03: 4 mL via RESPIRATORY_TRACT

## 2015-10-03 MED ORDER — CIPROFLOXACIN HCL 500 MG PO TABS: 500.0000 mg | ORAL_TABLET | Freq: Two times a day (BID) | ORAL | Status: DC

## 2015-10-03 MED ORDER — GLYCOPYRROLATE 0.2 MG/ML IJ SOLN
INTRAMUSCULAR | Status: DC | PRN
  Administered 2015-10-03: .7 mg via INTRAVENOUS

## 2015-10-03 MED ORDER — OXYCODONE HCL 5 MG/5ML PO SOLN: 5.0000 mg | Freq: Once | ORAL | Status: AC | PRN

## 2015-10-03 MED ORDER — ONDANSETRON HCL 4 MG/2ML IJ SOLN
INTRAMUSCULAR | Status: DC | PRN
  Administered 2015-10-03: 4 mg via INTRAVENOUS

## 2015-10-03 MED ORDER — ROCURONIUM BROMIDE 100 MG/10ML IV SOLN
INTRAVENOUS | Status: DC | PRN
  Administered 2015-10-03: 25 mg via INTRAVENOUS

## 2015-10-03 MED ORDER — OXYCODONE HCL 5 MG PO TABS
ORAL_TABLET | ORAL | Status: DC
Start: 2015-10-03 — End: 2015-10-03
  Filled 2015-10-03: qty 1

## 2015-10-03 MED ORDER — ACETAMINOPHEN 10 MG/ML IV SOLN
INTRAVENOUS | Status: DC | PRN
  Administered 2015-10-03: 1000 mg via INTRAVENOUS

## 2015-10-03 MED ORDER — FENTANYL CITRATE (PF) 100 MCG/2ML IJ SOLN
INTRAMUSCULAR | Status: AC
  Filled 2015-10-03: qty 2

## 2015-10-03 MED ORDER — MIDAZOLAM HCL 2 MG/2ML IJ SOLN
INTRAMUSCULAR | Status: DC | PRN
  Administered 2015-10-03: 2 mg via INTRAVENOUS

## 2015-10-03 MED ORDER — PROPOFOL 10 MG/ML IV BOLUS
INTRAVENOUS | Status: DC | PRN
  Administered 2015-10-03: 200 mg via INTRAVENOUS

## 2015-10-03 MED ORDER — LACTATED RINGERS IV SOLN
INTRAVENOUS | Status: DC
  Administered 2015-10-03: 10:00:00 via INTRAVENOUS

## 2015-10-03 SURGICAL SUPPLY — 33 items
BACTOSHIELD CHG 4% 4OZ (MISCELLANEOUS) ×2
BASKET ZERO TIP 1.9FR (BASKET) ×3 IMPLANT
CATH URETL 5X70 OPEN END (CATHETERS) ×3 IMPLANT
CNTNR SPEC 2.5X3XGRAD LEK (MISCELLANEOUS) ×1
CONRAY 43 FOR UROLOGY 50M (MISCELLANEOUS) ×3 IMPLANT
CONT SPEC 4OZ STER OR WHT (MISCELLANEOUS) ×2
CONTAINER SPEC 2.5X3XGRAD LEK (MISCELLANEOUS) ×1 IMPLANT
FEE TECHNICIAN ONLY PER HOUR (MISCELLANEOUS) IMPLANT
GLOVE BIO SURGEON STRL SZ7 (GLOVE) ×6 IMPLANT
GLOVE BIO SURGEON STRL SZ7.5 (GLOVE) ×3 IMPLANT
GOWN STRL REUS W/ TWL LRG LVL3 (GOWN DISPOSABLE) ×1 IMPLANT
GOWN STRL REUS W/ TWL LRG LVL4 (GOWN DISPOSABLE) ×1 IMPLANT
GOWN STRL REUS W/TWL LRG LVL3 (GOWN DISPOSABLE) ×2
GOWN STRL REUS W/TWL LRG LVL4 (GOWN DISPOSABLE) ×2
GOWN STRL REUS W/TWL XL LVL3 (GOWN DISPOSABLE) ×3 IMPLANT
GUIDEWIRE SUPER STIFF (WIRE) IMPLANT
KIT RM TURNOVER CYSTO AR (KITS) ×3 IMPLANT
LASER HOLMIUM 3 IN 1 DAY (MISCELLANEOUS) ×3 IMPLANT
LASER HOLMIUM FIBER SU 272UM (MISCELLANEOUS) IMPLANT
LASER HOLMIUM SU 200UM (MISCELLANEOUS) ×3 IMPLANT
PACK CYSTO AR (MISCELLANEOUS) ×3 IMPLANT
SCRUB CHG 4% DYNA-HEX 4OZ (MISCELLANEOUS) ×1 IMPLANT
SENSORWIRE 0.038 NOT ANGLED (WIRE) ×6
SET CYSTO W/LG BORE CLAMP LF (SET/KITS/TRAYS/PACK) ×3 IMPLANT
SHEATH URETERAL 13/15X36 1L (SHEATH) IMPLANT
SOL .9 NS 3000ML IRR  AL (IV SOLUTION) ×2
SOL .9 NS 3000ML IRR UROMATIC (IV SOLUTION) ×1 IMPLANT
STENT URET 6FRX24 CONTOUR (STENTS) IMPLANT
STENT URET 6FRX26 CONTOUR (STENTS) ×3 IMPLANT
SURGILUBE 2OZ TUBE FLIPTOP (MISCELLANEOUS) ×3 IMPLANT
SYRINGE IRR TOOMEY STRL 70CC (SYRINGE) ×3 IMPLANT
WATER STERILE IRR 1000ML POUR (IV SOLUTION) ×3 IMPLANT
WIRE SENSOR 0.038 NOT ANGLED (WIRE) ×2 IMPLANT

## 2015-10-03 NOTE — H&P (View-Only) (Signed)
09/22/2015 9:08 AM   Aaron Nichols 05/07/1971 [id number]  Referring provider: No referring provider defined for this encounter.  Chief Complaint  Patient presents with  . Post-op Problem    pt needs to be scheduled for Ureteroscopy     HPI: The patient is a 44 year old male with a left lower pole stone who underwent an attempted ureteroscopy a few weeks ago but his ureter was too narrow to accommodate the ureteroscope. A left renal stent was placed for passive dilation. The patient follows up today to discuss definitive surgical mesh. He is tolerating the stent well. He does have some flank pain with urination but other than that he has no problems with stent.    PMH: Past Medical History  Diagnosis Date  . 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 Hypertension   . Complication of anesthesia 2008    brain tumor 15 hours surgery resulting nausea and vomiting for severeal days  . PONV (postoperative nausea and vomiting)   . Kidney stone 1990  . GERD (gastroesophageal reflux disease)   . Abnormal LFTs (liver function tests)   . Arthritis   . BP (high blood pressure) 01/26/2015  . Infection of the upper respiratory tract 04/22/2015  . Right ureteral stone 07/03/2015  . Hydronephrosis with urinary obstruction due to ureteral calculus 07/03/2015    Surgical History: Past Surgical History  Procedure Laterality Date  . Brain tumor excision    . Elbow fracture surgery Left 1998  . Ureteroscopy with holmium laser lithotripsy Right 07/06/2015    Procedure: URETEROSCOPY ;  Surgeon: Collier Flowers, MD;  Location: [facility];  Service: Urology;  Laterality: Right;  . Cystoscopy with stent placement Right 07/06/2015    Procedure: CYSTOSCOPY WITH STENT PLACEMENT;  Surgeon: Collier Flowers, MD;  Location: [facility];  Service: Urology;  Laterality: Right;  . Ureteroscopy with holmium laser lithotripsy Right 07/25/2015    Procedure: URETEROSCOPY WITH HOLMIUM LASER LITHOTRIPSY, CYSTOSCOPY,  STENT  REMOVAL ;  Surgeon: Collier Flowers, MD;  Location: [facility];  Service: Urology;  Laterality: Right;  . Ureteroscopy with holmium laser lithotripsy Left 09/07/2015    Procedure: URETEROSCOPY;  Surgeon: Nickie Retort, MD;  Location: [facility];  Service: Urology;  Laterality: Left;  . Cystoscopy w/ retrogrades Left 09/07/2015    Procedure: CYSTOSCOPY WITH RETROGRADE PYELOGRAM;  Surgeon: Nickie Retort, MD;  Location: [facility];  Service: Urology;  Laterality: Left;  . Cystoscopy with stent placement Left 09/07/2015    Procedure: CYSTOSCOPY WITH STENT PLACEMENT;  Surgeon: Nickie Retort, MD;  Location: [facility];  Service: Urology;  Laterality: Left;    Home Medications:    Medication List       This list is accurate as of: 09/22/15  9:08 AM.  Always use your most recent med list.               acetaminophen 500 MG tablet  Commonly known as:  TYLENOL  Take 1,000 mg by mouth daily.     amLODipine 2.5 MG tablet  Commonly known as:  NORVASC  Take 2.5 mg by mouth daily.     ciprofloxacin 500 MG tablet  Commonly known as:  CIPRO  Take 1 tablet (500 mg total) by mouth 2 (two) times daily.     docusate sodium 100 MG capsule  Commonly known as:  COLACE  Take 1 capsule (100 mg total) by mouth 2 (two) times daily.     famotidine  10 MG tablet  Commonly known as:  PEPCID  Take 10 mg by mouth 2 (two) times daily.     HYDROcodone-acetaminophen 5-325 MG tablet  Commonly known as:  NORCO  Take 1 tablet by mouth every 6 (six) hours as needed for moderate pain.     lisinopril 20 MG tablet  Commonly known as:  PRINIVIL,ZESTRIL  Take 20 mg by mouth daily.     multivitamin with minerals tablet  Take 1 tablet by mouth daily.        Allergies:  Allergies  Allergen Reactions  . Ceftin [Cefuroxime Axetil]     Dizziness,lightheaded    Family History: Family History  Problem Relation Age of Onset  . Heart disease Father     Social History:  reports that he has been  smoking.  He has never used smokeless tobacco. He reports that he does not drink alcohol or use illicit drugs.  ROS: UROLOGY Frequent Urination?: No Hard to postpone urination?: No Burning/pain with urination?: Yes Get up at night to urinate?: No Leakage of urine?: No Urine stream starts and stops?: No Trouble starting stream?: No Do you have to strain to urinate?: No Blood in urine?: Yes Urinary tract infection?: No Sexually transmitted disease?: No Injury to kidneys or bladder?: No Painful intercourse?: No Weak stream?: No Erection problems?: No Penile pain?: No  Gastrointestinal Nausea?: No Vomiting?: No Indigestion/heartburn?: No Diarrhea?: No Constipation?: No  Constitutional Fever: No Night sweats?: No Weight loss?: No Fatigue?: No  Skin Skin rash/lesions?: No Itching?: No  Eyes Blurred vision?: No Double vision?: No  Ears/Nose/Throat Sore throat?: No Sinus problems?: No  Hematologic/Lymphatic Swollen glands?: No Easy bruising?: No  Cardiovascular Leg swelling?: No Chest pain?: No  Respiratory Cough?: No Shortness of breath?: No  Endocrine Excessive thirst?: No  Musculoskeletal Back pain?: No Joint pain?: No  Neurological Headaches?: No Dizziness?: No  Psychologic Depression?: No Anxiety?: No  Physical Exam: BP 125/77 mmHg  Pulse 85  Ht 5\' 10"  (1.778 m)  Wt 226 lb 3.2 oz (102.604 kg)  BMI 32.46 kg/m2  Constitutional:  Alert and oriented, No acute distress. HEENT: [place] AT, moist mucus membranes.  Trachea midline, no masses. Cardiovascular: No clubbing, cyanosis, or edema. Respiratory: Normal respiratory effort, no increased work of breathing. GI: Abdomen is soft, nontender, nondistended, no abdominal masses GU: No CVA tenderness.  Skin: No rashes, bruises or suspicious lesions. Lymph: No cervical or inguinal adenopathy. Neurologic: Grossly intact, no focal deficits, moving all 4 extremities. Psychiatric: Normal mood and  affect.  Laboratory Data: Lab Results  Component Value Date   WBC 10.1 06/20/2015   HGB 14.0 06/20/2015   HCT 41.9 06/20/2015   MCV 90.1 06/20/2015   PLT 245 06/20/2015    Lab Results  Component Value Date   CREATININE 0.92 06/20/2015    No results found for: PSA  No results found for: TESTOSTERONE  No results found for: HGBA1C  Urinalysis    Component Value Date/Time   COLORURINE YELLOW 06/25/2015 0921   APPEARANCEUR CLEAR 06/25/2015 0921   LABSPEC <1.005* 06/25/2015 0921   PHURINE 5.0 06/25/2015 0921   GLUCOSEU Negative 08/23/2015 0924   HGBUR 3+* 06/25/2015 0921   BILIRUBINUR Negative 08/23/2015 0924   BILIRUBINUR NEGATIVE 06/25/2015 0921   KETONESUR NEGATIVE 06/25/2015 0921   PROTEINUR NEGATIVE 06/25/2015 0921   NITRITE Negative 08/23/2015 0924   NITRITE NEGATIVE 06/25/2015 0921   LEUKOCYTESUR Negative 08/23/2015 0924   LEUKOCYTESUR NEGATIVE 06/25/2015 0921     Assessment & Plan:  1. Left renal stone with previous left ureteral stent We'll set the patient up for cystoscopy, left ureteroscopy, left retrograde pyelogram, laser lithotripsy, stone basketing, and left ureteral stent exchange. He understands the risks, benefits, indications of surgery. All questions were answered. The patient is agreeable to proceeding.   Return for surgery.  Nickie Retort, MD  [facility] [address] [place], [place] [address] [phone]

## 2015-10-03 NOTE — Anesthesia Postprocedure Evaluation (Signed)
Anesthesia Post Note  Patient: Aaron Nichols  Procedure(s) Performed: Procedure(s) (LRB): URETEROSCOPY WITH HOLMIUM LASER LITHOTRIPSY (Left) CYSTOSCOPY WITH STENT PLACEMENT/ EXCHANGE/STONE BASKETING (Left) CYSTOSCOPY WITH RETROGRADE PYELOGRAM (Left)  Patient location during evaluation: PACU Anesthesia Type: General Level of consciousness: awake and alert Pain management: pain level controlled Vital Signs Assessment: post-procedure vital signs reviewed and stable Respiratory status: spontaneous breathing, nonlabored ventilation, respiratory function stable and patient connected to nasal cannula oxygen Cardiovascular status: blood pressure returned to baseline and stable Postop Assessment: no signs of nausea or vomiting Anesthetic complications: no    Last Vitals:  Filed Vitals:   10/03/15 1200 10/03/15 1221  BP:  104/64  Pulse: 69 61  Temp: 36.3 C 36.1 C  Resp: 9 12    Last Pain:  Filed Vitals:   10/03/15 1226  PainSc: 2                  Precious Haws Mandela Bello

## 2015-10-03 NOTE — Transfer of Care (Signed)
Immediate Anesthesia Transfer of Care Note  Patient: Freddrick March  Procedure(s) Performed: Procedure(s): URETEROSCOPY WITH HOLMIUM LASER LITHOTRIPSY (Left) CYSTOSCOPY WITH STENT PLACEMENT/ EXCHANGE/STONE BASKETING (Left) CYSTOSCOPY WITH RETROGRADE PYELOGRAM (Left)  Patient Location: PACU  Anesthesia Type:General  Level of Consciousness: awake, alert , oriented and patient cooperative  Airway & Oxygen Therapy: Patient Spontanous Breathing and Patient connected to nasal cannula oxygen  Post-op Assessment: Report given to RN and Post -op Vital signs reviewed and stable  Post vital signs: Reviewed and stable  Last Vitals:  Filed Vitals:   10/03/15 0943  BP: 121/73  Pulse: 77  Temp: 37 C  Resp: 16    Complications: No apparent anesthesia complications

## 2015-10-03 NOTE — Discharge Instructions (Signed)

## 2015-10-03 NOTE — Interval H&P Note (Signed)
History and Physical Interval Note:  10/03/2015 9:59 AM  Aaron Nichols  has presented today for surgery, with the diagnosis of LEFT RENAL STONE  The various methods of treatment have been discussed with the patient and family. After consideration of risks, benefits and other options for treatment, the patient has consented to  Procedure(s): URETEROSCOPY WITH HOLMIUM LASER LITHOTRIPSY (Left) CYSTOSCOPY WITH STENT PLACEMENT/ EXCHANGE/STONE BASKETING (Left) CYSTOSCOPY WITH RETROGRADE PYELOGRAM (Left) as a surgical intervention .  The patient's history has been reviewed, patient examined, no change in status, stable for surgery.  I have reviewed the patient's chart and labs.  Questions were answered to the patient's satisfaction.    RRR Unlabored respirations  Horald Pollen [place]

## 2015-10-03 NOTE — Anesthesia Preprocedure Evaluation (Signed)
Anesthesia Evaluation  Patient identified by MRN, date of birth, ID band Patient awake    Reviewed: Allergy & Precautions, H&P , NPO status , Patient's Chart, lab work & pertinent test results  History of Anesthesia Complications (+) PONV and history of anesthetic complications  Airway Mallampati: III  TM Distance: <3 FB Neck ROM: full    Dental no notable dental hx. (+) Teeth Intact   Pulmonary neg shortness of breath, Current Smoker,    Pulmonary exam normal breath sounds clear to auscultation       Cardiovascular Exercise Tolerance: Good hypertension, (-) angina(-) Past MI and (-) DOE Normal cardiovascular exam Rhythm:regular Rate:Normal     Neuro/Psych  Neuromuscular disease negative neurological ROS  negative psych ROS   GI/Hepatic negative GI ROS, Neg liver ROS, GERD  Controlled,  Endo/Other  negative endocrine ROS  Renal/GU Renal disease  negative genitourinary   Musculoskeletal  (+) Arthritis ,   Abdominal   Peds  Hematology negative hematology ROS (+)   Anesthesia Other Findings Past Medical History:   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   Hypertension                                                 Complication of anesthesia                      2008           Comment:brain tumor 15 hours surgery resulting nausea               and vomiting for severeal days   PONV (postoperative nausea and vomiting)                     Kidney stone                                    1990         GERD (gastroesophageal reflux disease)                       Abnormal LFTs (liver function tests)                         Arthritis                                                   Past Surgical History:   BRAIN TUMOR EXCISION                                          ELBOW FRACTURE SURGERY                          Left  1998         URETEROSCOPY WITH HOLMIUM LASER  LITHOTRIPSY     Right 07/06/2015      Comment:Procedure: URETEROSCOPY ;  Surgeon: Collier Flowers, MD;  Location: [facility] ORS;  Service:               Urology;  Laterality: Right;   CYSTOSCOPY WITH STENT PLACEMENT                 Right 07/06/2015      Comment:Procedure: CYSTOSCOPY WITH STENT PLACEMENT;                Surgeon: Collier Flowers, MD;  Location: [facility]               ORS;  Service: Urology;  Laterality: Right;   URETEROSCOPY WITH HOLMIUM LASER LITHOTRIPSY     Right 07/25/2015      Comment:Procedure: URETEROSCOPY WITH HOLMIUM LASER               LITHOTRIPSY, CYSTOSCOPY,  STENT REMOVAL ;                Surgeon: Collier Flowers, MD;  Location: [facility]               ORS;  Service: Urology;  Laterality: Right;  BMI    Body Mass Index   32.28 kg/m 2    Right sided facial weakness and numbness from brain surgery    Reproductive/Obstetrics negative OB ROS                             Anesthesia Physical  Anesthesia Plan  ASA: III  Anesthesia Plan: General ETT   Post-op Pain Management:    Induction:   Airway Management Planned:   Additional Equipment:   Intra-op Plan:   Post-operative Plan:   Informed Consent: I have reviewed the patients History and Physical, chart, labs and discussed the procedure including the risks, benefits and alternatives for the proposed anesthesia with the patient or authorized representative who has indicated his/her understanding and acceptance.   Dental Advisory Given  Plan Discussed with: Anesthesiologist, CRNA and Surgeon  Anesthesia Plan Comments:         Anesthesia Quick Evaluation

## 2015-10-03 NOTE — Op Note (Signed)
Date of procedure: 10/03/2015  Preoperative diagnosis:  1. Left renal stone   Postoperative diagnosis:  1. Left renal stone   Procedure: 1. Cystoscopy 2. Left ureteroscopy 3. Laser lithotripsy 4. Stone basketing 5. Left retrograde pyelogram with interpretation 6. Left ureteral stent exchange (6 [place] by 26 cm)  Surgeon: Baruch Gouty, MD  Anesthesia: General  Complications: None  Intraoperative findings: Left renal stone was found in the lower pole of the kidney. It was broken into pieces and removed. Left retropyelogram and procedure showed no residual filling defects. Fluoroscopy confirmed correct stent placement.  EBL: None  Specimens: Left renal stone  Drains: 6 French by 26 cm double-J ureteral stent on the left  Disposition: Stable to the postanesthesia care unit  Indication for procedure: The patient is a 44 y.o. male with 7 mm left lower pole stone that was unable to be accessed during his last procedure presents for definitive stone management.  After reviewing the management options for treatment, the patient elected to proceed with the above surgical procedure(s). We have discussed the potential benefits and risks of the procedure, side effects of the proposed treatment, the likelihood of the patient achieving the goals of the procedure, and any potential problems that might occur during the procedure or recuperation. Informed consent has been obtained.  Description of procedure: The patient was met in the preoperative area. All risks, benefits, and indications of the procedure were described in great detail. The patient consented to the procedure. Preoperative antibiotics were given. The patient was taken to the operative theater. General anesthesia was induced per the anesthesia service. The patient was then placed in the dorsal lithotomy position and prepped and draped in the usual sterile fashion. A preoperative timeout was called.    A 21 French 30 cystoscope  was inserted into the patient's bladder per urethra atraumatically. The left ureteral stent was grabbed with flexible graspers and removed intact. With the aid of a dual lumen open-ended catheter 2 sensor wires were placed the level of the left renal pelvis under fluoroscopy. Cystoscope was then removed. A ureteral access sheath was then placed over one of the sensor wires to level the renal pelvis under fluoroscopy. A flexible ureteroscope was then inserted through the access sheath. Pan nephroscopy was unremarkable except for a 7 mm stone in the left lower pole. This was grasped with stone basket and moved to the middle pole for easier access for lithotripsy. It was then broken into 2 small fragments with laser lithotripsy. These 2 stone from its removed individually with a stone basket. There were no residual large fragments at this time. Retrograde powder was obtained through the ureteroscope showing no filling defects. The ureteral access sheath was then removed under direct visualization showing no significant ureteral trauma. The cystoscope was then reinserted over the remaining sensor wire. A 6 French by 26 cm double-J ureteral stent was placed over the sensor wire. The sensor was removed. The stent was confirmed to be in the correct location to curl seen in the patient's renal pelvis on fluoroscopy and a curl seen in the patient's urinary bladder under direct visualization. The patient's bladder was then drained. Cystoscope was removed. The patient was transferred stable condition to the postanesthesia care unit.  Plan: The patient will follow-up in approximately one week for ureteral stent removal. He will need a ultrasound in 1 month to rule out indolent hydronephrosis. He also is a good candidate for 24-hour urine collection due to his recurrent nephrolithiasis.  Baruch Gouty,  M.D.   

## 2015-10-03 NOTE — Telephone Encounter (Signed)
-----   Message from Nickie Retort, MD sent at 10/03/2015 11:09 AM EST ----- Patient needs to follow up at the end of this week or early next week for cysto/stent removal. Thanks.

## 2015-10-03 NOTE — Anesthesia Procedure Notes (Signed)
Procedure Name: Intubation Date/Time: 10/03/2015 10:31 AM Performed by: Rosaria Ferries, Avigail Pilling Pre-anesthesia Checklist: Patient identified, Emergency Drugs available, Suction available and Patient being monitored Patient Re-evaluated:Patient Re-evaluated prior to inductionOxygen Delivery Method: Circle system utilized Preoxygenation: Pre-oxygenation with 100% oxygen Intubation Type: IV induction Laryngoscope Size: Mac and 3 Grade View: Grade II Tube type: Oral Tube size: 7.0 mm Number of attempts: 1 Placement Confirmation: ETT inserted through vocal cords under direct vision,  positive ETCO2 and breath sounds checked- equal and bilateral Secured at: 24 cm Tube secured with: Tape Dental Injury: Teeth and Oropharynx as per pre-operative assessment  Difficulty Due To: Difficulty was anticipated and Difficult Airway- due to anterior larynx Future Recommendations: Recommend- induction with short-acting agent, and alternative techniques readily available

## 2015-10-05 ENCOUNTER — Ambulatory Visit (INDEPENDENT_AMBULATORY_CARE_PROVIDER_SITE_OTHER): Payer: 59 | Admitting: Urology

## 2015-10-05 VITALS — BP 135/82 | HR 86 | Ht 70.0 in | Wt 227.7 lb

## 2015-10-05 DIAGNOSIS — N200 Calculus of kidney: Secondary | ICD-10-CM

## 2015-10-05 LAB — MICROSCOPIC EXAMINATION: WBC UA: NONE SEEN /HPF (ref 0–?)

## 2015-10-05 LAB — URINALYSIS, COMPLETE
BILIRUBIN UA: NEGATIVE
Glucose, UA: NEGATIVE
Ketones, UA: NEGATIVE
NITRITE UA: NEGATIVE
PH UA: 5 (ref 5.0–7.5)
Specific Gravity, UA: >1.03 — ABNORMAL HIGH (ref 1.005–1.030)
UUROB: 0.2 mg/dL (ref 0.2–1.0)

## 2015-10-05 MED ORDER — LIDOCAINE HCL 2 % EX GEL
1.0000 "application " | Freq: Once | CUTANEOUS | Status: AC
  Administered 2015-10-05: 1 via URETHRAL

## 2015-10-05 MED ORDER — CIPROFLOXACIN HCL 500 MG PO TABS: 500.0000 mg | ORAL_TABLET | Freq: Once | ORAL | Status: DC

## 2015-10-05 NOTE — Progress Notes (Signed)
10/05/2015 12:01 PM   Aaron Nichols 14-Dec-1970 [id number]  Referring provider: No referring provider defined for this encounter.  Chief Complaint  Patient presents with  . Cysto Stent Removal    HPI: The patient is a 44 year old male with recurrent nephrolithiasis presents today for left ureteral stent removal after ureteroscopy and stone removal a few days ago.     PMH: Past Medical History  Diagnosis Date  . Brain tumor ([place])     acoustic neuroma  . Hypertension   . Kidney stone 1990  . GERD (gastroesophageal reflux disease)   . Abnormal LFTs (liver function tests)   . Arthritis   . BP (high blood pressure) 01/26/2015  . Infection of the upper respiratory tract 04/22/2015  . Right ureteral stone 07/03/2015  . Hydronephrosis with urinary obstruction due to ureteral calculus 07/03/2015  . Right-sided Bell's palsy 2008    nerve fatigue  . Complication of anesthesia 2008    brain tumor 15 hours surgery resulting nausea and vomiting for severeal days  . PONV (postoperative nausea and vomiting)   . Complication of anesthesia     pt. woke up before extubation with cystoscopy    Surgical History: Past Surgical History  Procedure Laterality Date  . Brain tumor excision    . Elbow fracture surgery Left 1998  . Ureteroscopy with holmium laser lithotripsy Right 07/06/2015    Procedure: URETEROSCOPY ;  Surgeon: Collier Flowers, MD;  Location: [facility] ORS;  Service: Urology;  Laterality: Right;  . Cystoscopy with stent placement Right 07/06/2015    Procedure: CYSTOSCOPY WITH STENT PLACEMENT;  Surgeon: Collier Flowers, MD;  Location: [facility];  Service: Urology;  Laterality: Right;  . Ureteroscopy with holmium laser lithotripsy Right 07/25/2015    Procedure: URETEROSCOPY WITH HOLMIUM LASER LITHOTRIPSY, CYSTOSCOPY,  STENT REMOVAL ;  Surgeon: Collier Flowers, MD;  Location: [facility];  Service: Urology;  Laterality: Right;  . Ureteroscopy with holmium laser lithotripsy Left 09/07/2015   Procedure: URETEROSCOPY;  Surgeon: Nickie Retort, MD;  Location: [facility];  Service: Urology;  Laterality: Left;  . Cystoscopy w/ retrogrades Left 09/07/2015    Procedure: CYSTOSCOPY WITH RETROGRADE PYELOGRAM;  Surgeon: Nickie Retort, MD;  Location: [facility];  Service: Urology;  Laterality: Left;  . Cystoscopy with stent placement Left 09/07/2015    Procedure: CYSTOSCOPY WITH STENT PLACEMENT;  Surgeon: Nickie Retort, MD;  Location: [facility];  Service: Urology;  Laterality: Left;  . Fracture surgery    . Ureteroscopy with holmium laser lithotripsy Left 10/03/2015    Procedure: URETEROSCOPY WITH HOLMIUM LASER LITHOTRIPSY;  Surgeon: Nickie Retort, MD;  Location: [facility];  Service: Urology;  Laterality: Left;  . Cystoscopy with stent placement Left 10/03/2015    Procedure: CYSTOSCOPY WITH STENT PLACEMENT/ EXCHANGE/STONE BASKETING;  Surgeon: Nickie Retort, MD;  Location: [facility];  Service: Urology;  Laterality: Left;  . Cystoscopy w/ retrogrades Left 10/03/2015    Procedure: CYSTOSCOPY WITH RETROGRADE PYELOGRAM;  Surgeon: Nickie Retort, MD;  Location: [facility];  Service: Urology;  Laterality: Left;    Home Medications:    Medication List       This list is accurate as of: 10/05/15 12:01 PM.  Always use your most recent med list.               amLODipine 2.5 MG tablet  Commonly known as:  NORVASC     ciprofloxacin 500 MG tablet  Commonly known as:  CIPRO  Take 1  tablet (500 mg total) by mouth 2 (two) times daily.     docusate sodium 100 MG capsule  Commonly known as:  COLACE  Take 1 capsule (100 mg total) by mouth 2 (two) times daily.     famotidine 10 MG tablet  Commonly known as:  PEPCID  Take 10 mg by mouth 2 (two) times daily.     HYDROcodone-acetaminophen 5-325 MG tablet  Commonly known as:  NORCO  Take 1 tablet by mouth every 6 (six) hours as needed for moderate pain.     lisinopril 20 MG tablet  Commonly known as:  PRINIVIL,ZESTRIL  Take  20 mg by mouth at bedtime.     multivitamin with minerals tablet  Take 1 tablet by mouth daily.        Allergies:  Allergies  Allergen Reactions  . Ceftin [Cefuroxime Axetil]     Dizziness,lightheaded    Family History: Family History  Problem Relation Age of Onset  . Heart disease Father     Social History:  reports that he has been smoking Cigarettes.  He has been smoking about 1.50 packs per day. He has never used smokeless tobacco. He reports that he does not drink alcohol or use illicit drugs.  ROS:                                        Physical Exam: BP 135/82 mmHg  Pulse 86  Ht 5\' 10"  (1.778 m)  Wt 227 lb 11.2 oz (103.284 kg)  BMI 32.67 kg/m2  Constitutional:  Alert and oriented, No acute distress. HEENT: [place] AT, moist mucus membranes.  Trachea midline, no masses. Cardiovascular: No clubbing, cyanosis, or edema. Respiratory: Normal respiratory effort, no increased work of breathing. GI: Abdomen is soft, nontender, nondistended, no abdominal masses GU: No CVA tenderness.  Skin: No rashes, bruises or suspicious lesions. Lymph: No cervical or inguinal adenopathy. Neurologic: Grossly intact, no focal deficits, moving all 4 extremities. Psychiatric: Normal mood and affect.  Laboratory Data: Lab Results  Component Value Date   WBC 10.9* 09/27/2015   HGB 13.9 09/27/2015   HCT 41.5 09/27/2015   MCV 89.8 09/27/2015   PLT 300 09/27/2015    Lab Results  Component Value Date   CREATININE 0.99 10/03/2015    No results found for: PSA  No results found for: TESTOSTERONE  No results found for: HGBA1C  Urinalysis    Component Value Date/Time   COLORURINE YELLOW 06/25/2015 0921   APPEARANCEUR CLEAR 06/25/2015 0921   LABSPEC <1.005* 06/25/2015 0921   PHURINE 5.0 06/25/2015 0921   GLUCOSEU Negative 09/22/2015 0847   HGBUR 3+* 06/25/2015 0921   BILIRUBINUR Negative 09/22/2015 0847   BILIRUBINUR NEGATIVE 06/25/2015 0921   KETONESUR  NEGATIVE 06/25/2015 0921   PROTEINUR NEGATIVE 06/25/2015 0921   NITRITE Negative 09/22/2015 0847   NITRITE NEGATIVE 06/25/2015 0921   LEUKOCYTESUR 1+* 09/22/2015 0847   LEUKOCYTESUR NEGATIVE 06/25/2015 0921     Cystoscopy Procedure Note  Patient identification was confirmed, informed consent was obtained, and patient was prepped using Betadine solution.  Lidocaine jelly was administered per urethral meatus.    Preoperative abx where received prior to procedure.     Pre-Procedure: - Inspection reveals a normal caliber ureteral meatus.  Procedure: The flexible cystoscope was introduced without difficulty - No urethral strictures/lesions are present. - A left ureteral stent was grasped with flexible graspers and removed intact from  the bladder through the urethral meatus.   Post-Procedure: - Patient tolerated the procedure well    Assessment & Plan:    1. Recurrent nephrolithiasis The patient will follow-up in one month after obtaining a renal ultrasound to ensure there is no indolent hydronephrosis after ureteral instrumentation. He will also undergo 24-hour urine collection during this interval. We will also go over her stone analysis at that time since it is not available today.   Nickie Retort, MD  [facility] [address] [place], [place] [address] [phone]

## 2015-10-06 LAB — STONE ANALYSIS
CA OXALATE, MONOHYDR.: 65 %
CA PHOS CRY STONE QL IR: 10 %
Ca Oxalate,Dihydrate: 25 %
Stone Weight KSTONE: 49 mg

## 2015-10-14 IMAGING — US US RENAL
1 series · 14 of 25 positions shown · non-contrast
Comparison: None in PACs

CLINICAL DATA: Two days of left flank pain and gross hematuria

EXAM:
RENAL / URINARY TRACT ULTRASOUND COMPLETE

[Series 1: us renal · 0.24mm/px · 14 of 30 slices shown]
[im 1/30]
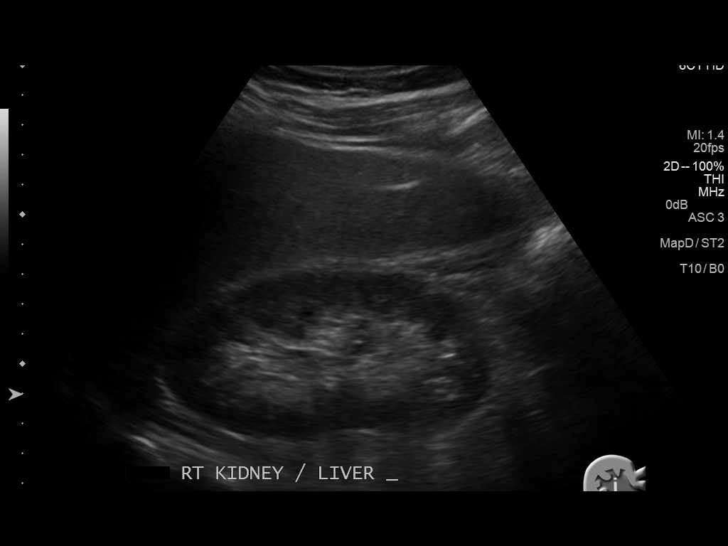
[im 3/30]
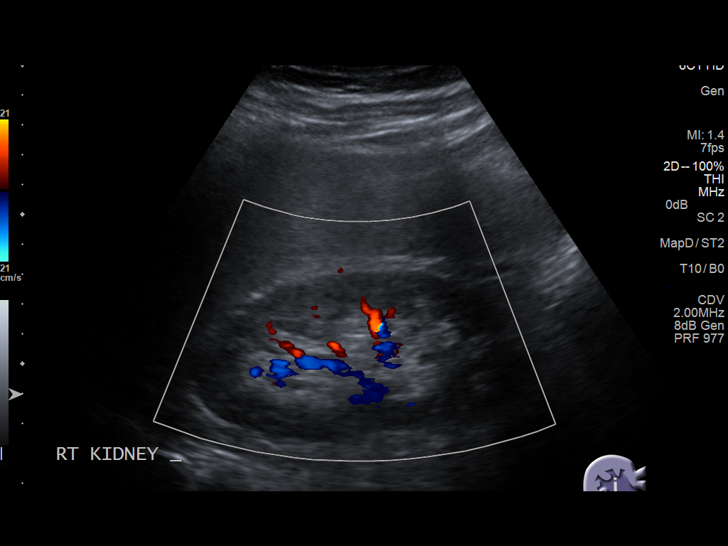
[im 5/30]
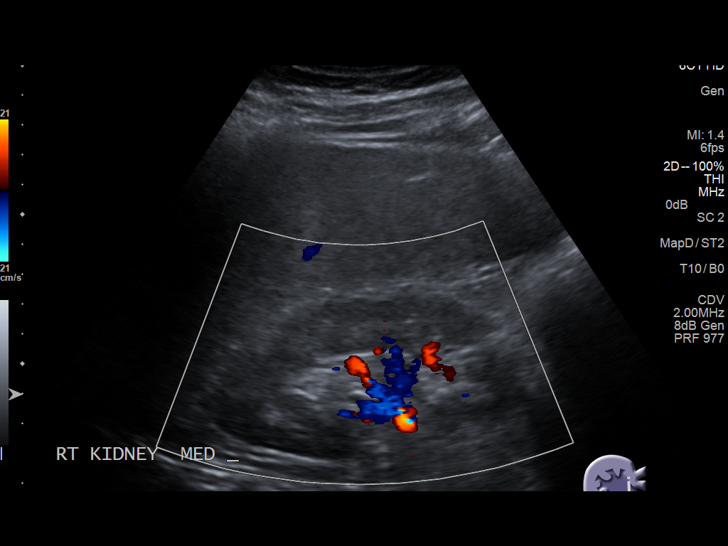
[im 8/30]
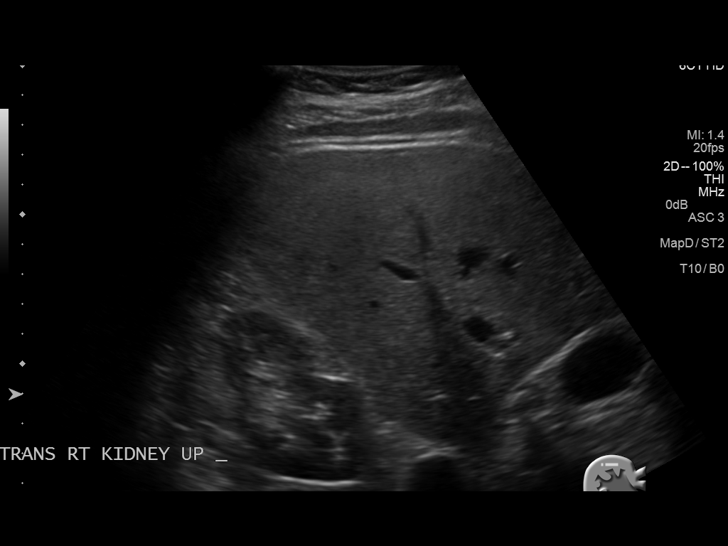
[im 10/30]
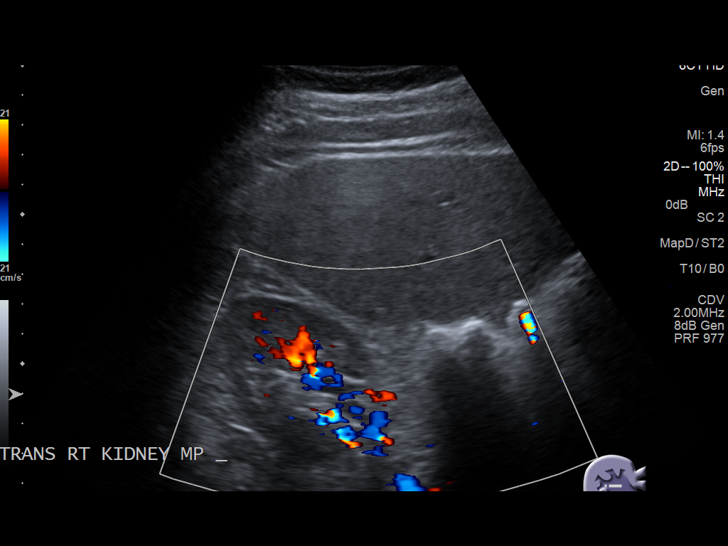
[im 11/30]
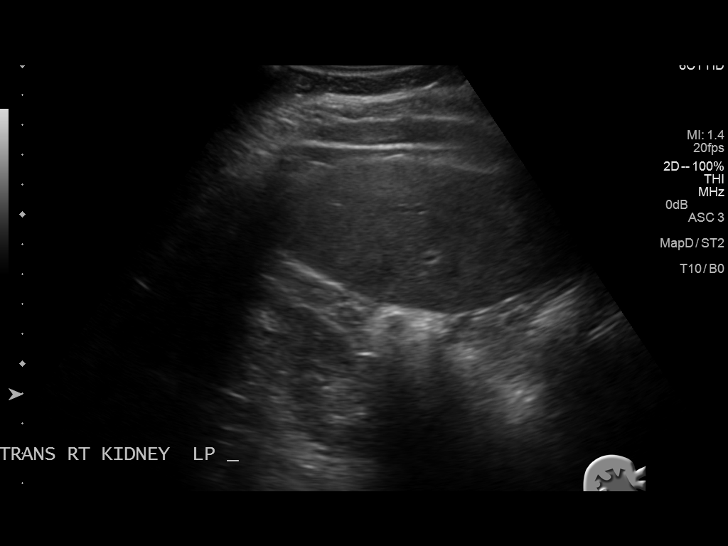
[im 14/30]
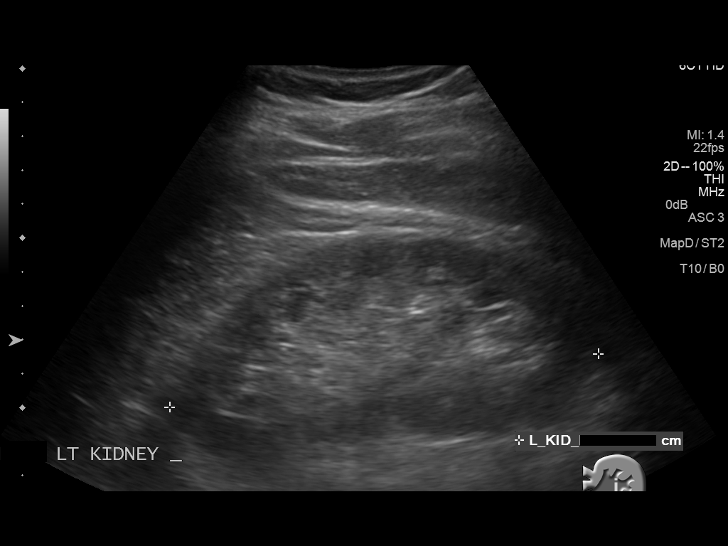
[im 16/30]
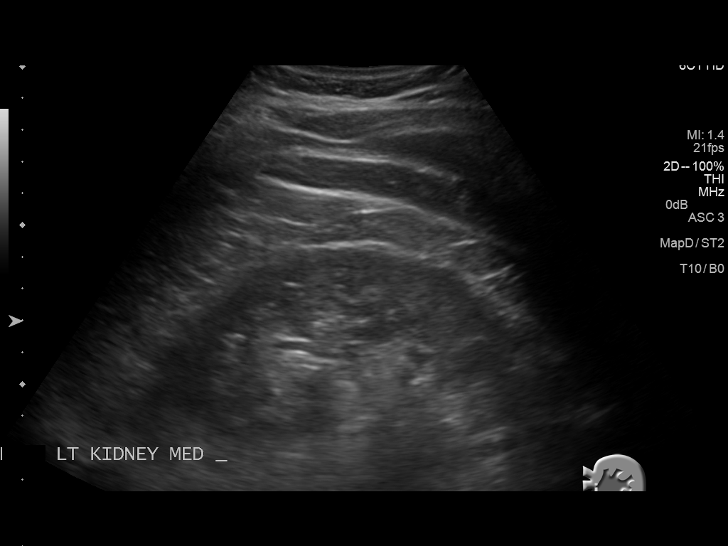
[im 19/30]
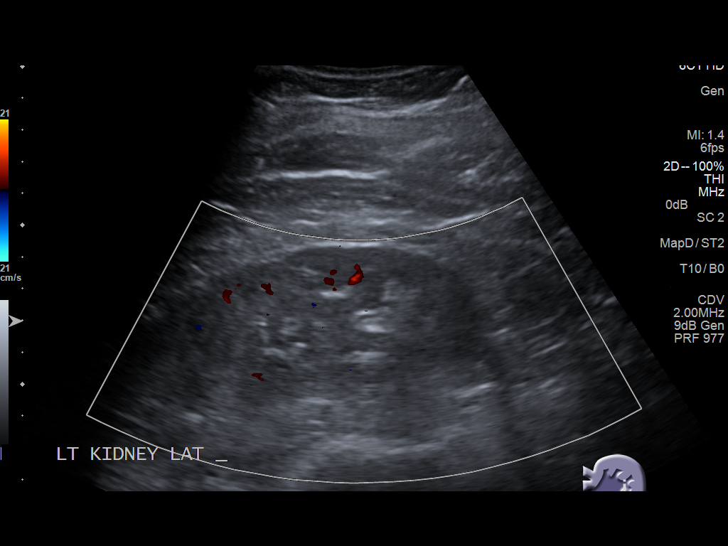
[im 20/30]
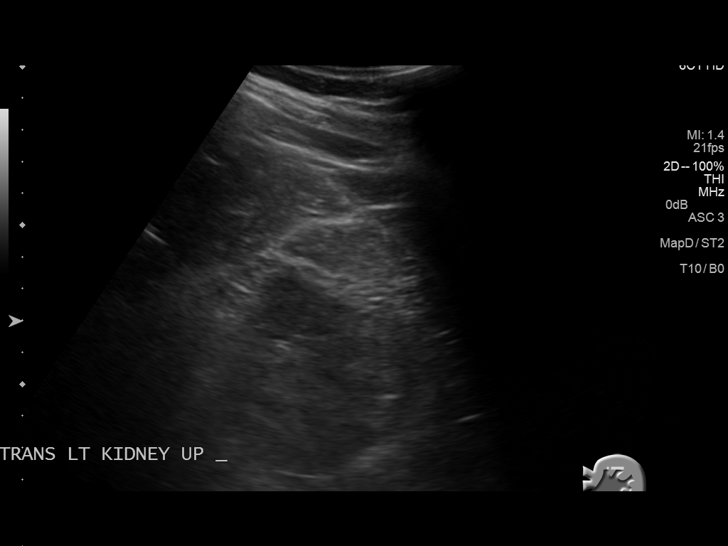
[im 22/30]
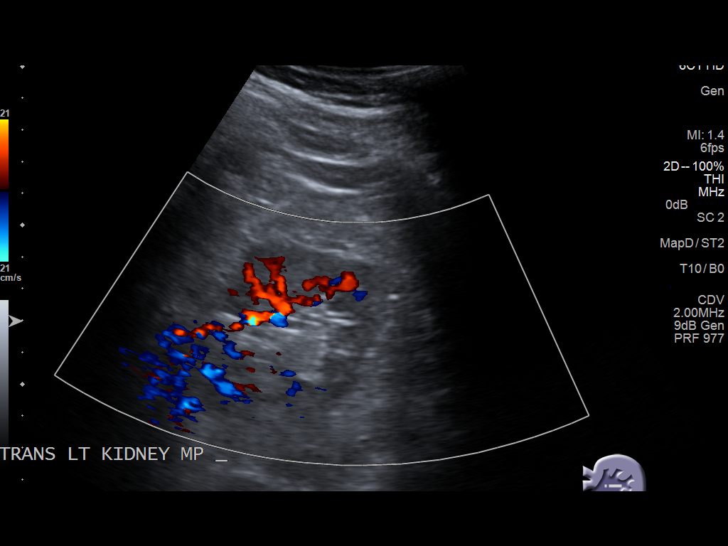
[im 25/30]
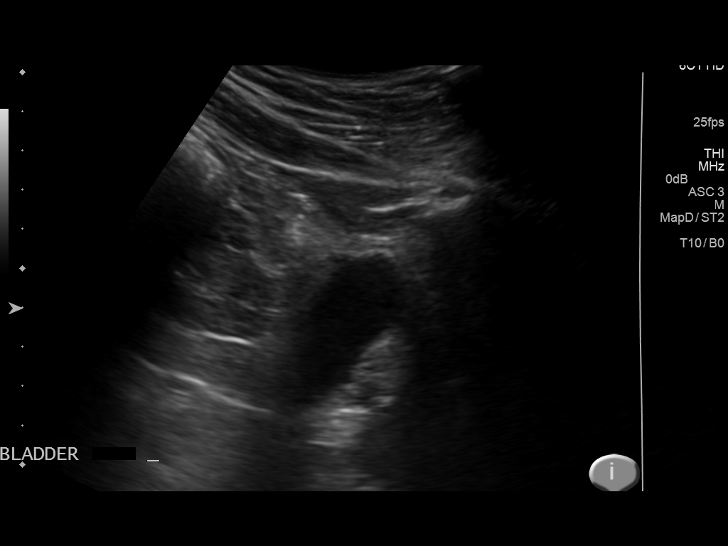
[im 27/30]
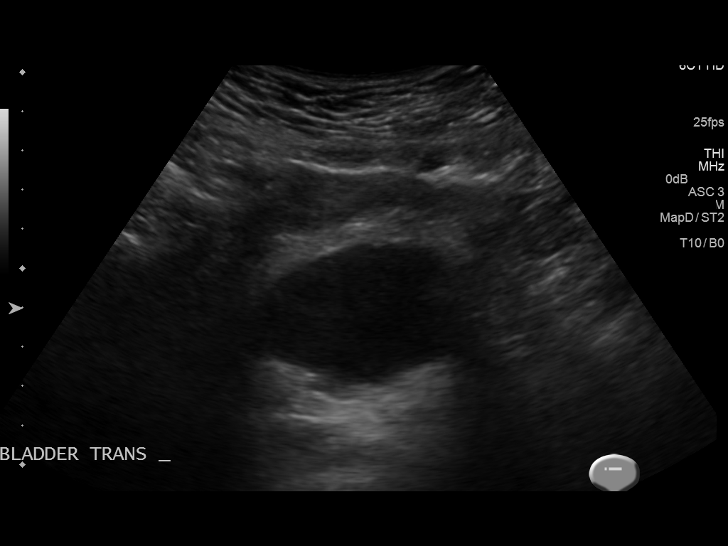
[im 30/30]
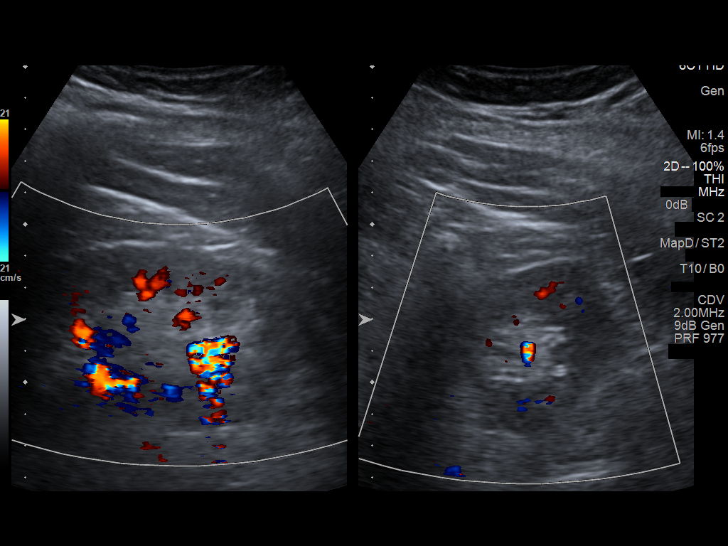

[14 of 25 positions shown; findings below may reference images not displayed]

FINDINGS: Right Kidney:

Length: 11.4 cm. The cortical echotexture is normal. There is no
hydronephrosis or cystic or solid mass.

Left Kidney:

Length: 12.7 cm. The cortical echotexture is normal. In the lower
pole there is echogenic shadowing focus measuring 10 x 6 mm
compatible with a nonobstructing stone. There is no hydronephrosis.
There is no cystic nor solid-appearing mass.

Bladder:

The partially distended urinary bladder is unremarkable.
IMPRESSION: There is no hydronephrosis. Nonobstructing stone or stones are
present in the lower pole of the left kidney. No acute bladder
abnormality is observed.

## 2015-10-19 ENCOUNTER — Ambulatory Visit
Admission: RE | Admit: 2015-10-19 | Discharge: 2015-10-19 | Disposition: A | Discharge status: Home / Self Care | Payer: 59 | Source: Ambulatory Visit | Attending: Urology | Admitting: Urology

## 2015-10-19 DIAGNOSIS — Z08 Encounter for follow-up examination after completed treatment for malignant neoplasm: Secondary | ICD-10-CM | POA: Diagnosis not present

## 2015-10-19 DIAGNOSIS — Z87442 Personal history of urinary calculi: Secondary | ICD-10-CM | POA: Insufficient documentation

## 2015-10-19 DIAGNOSIS — N200 Calculus of kidney: Secondary | ICD-10-CM

## 2015-11-02 ENCOUNTER — Ambulatory Visit: Payer: 59

## 2015-11-28 IMAGING — US US RENAL
1 series · 14 of 25 positions shown · non-contrast
Comparison: CT abdomen 06/25/2015

ADDENDUM:
Typographical error in the body of the report. In the FINDING
section, SUBSECTION Right Kidney. The corrected sentence should read
as follows: Incidental note is made of a small vein coursing along
the superior aspect of the RIGHT kidney.
CLINICAL DATA: Kidney stones

EXAM:
RENAL / URINARY TRACT ULTRASOUND COMPLETE

[Series 1: us renal · 0.26mm/px · 14 of 51 slices shown]
[im 1/51]
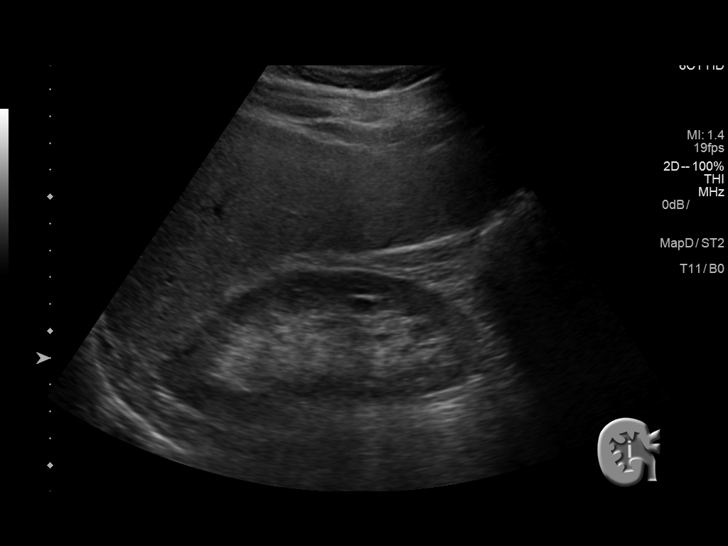
[im 5/51]
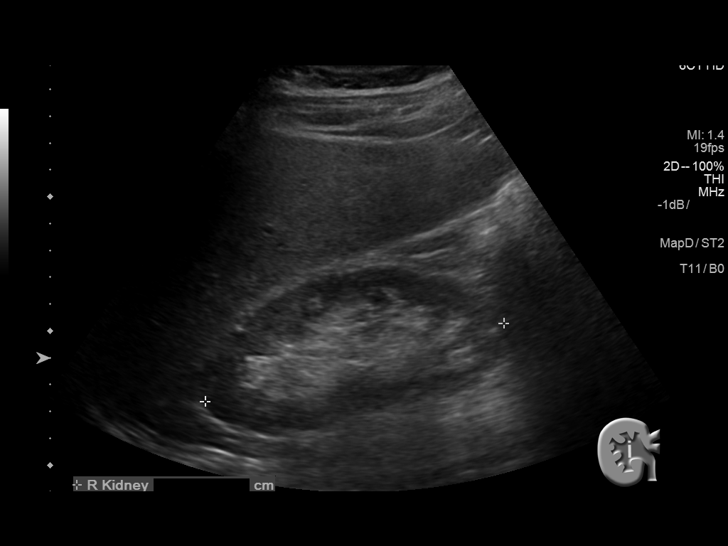
[im 9/51]
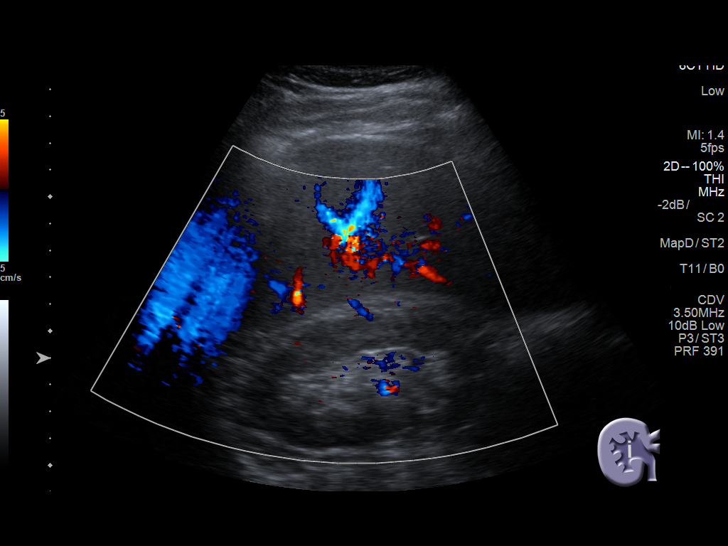
[im 13/51]
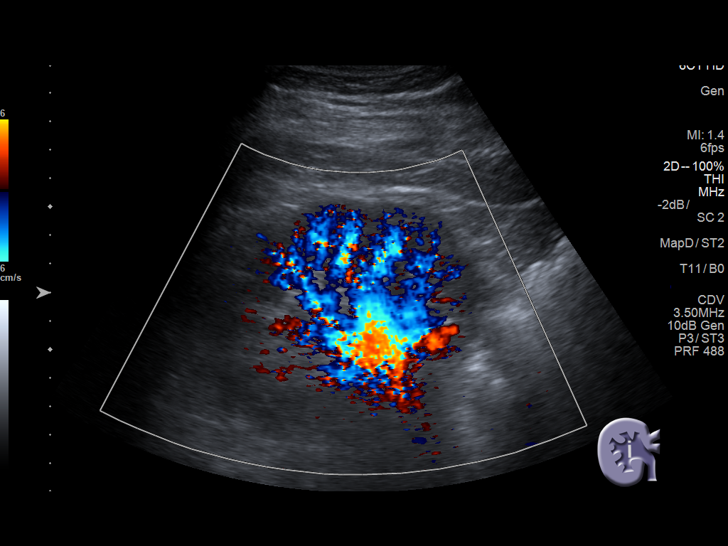
[im 17/51]
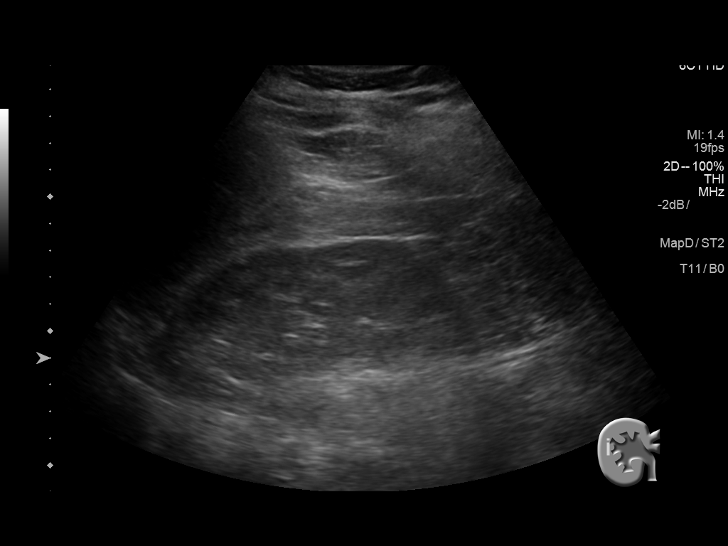
[im 19/51]
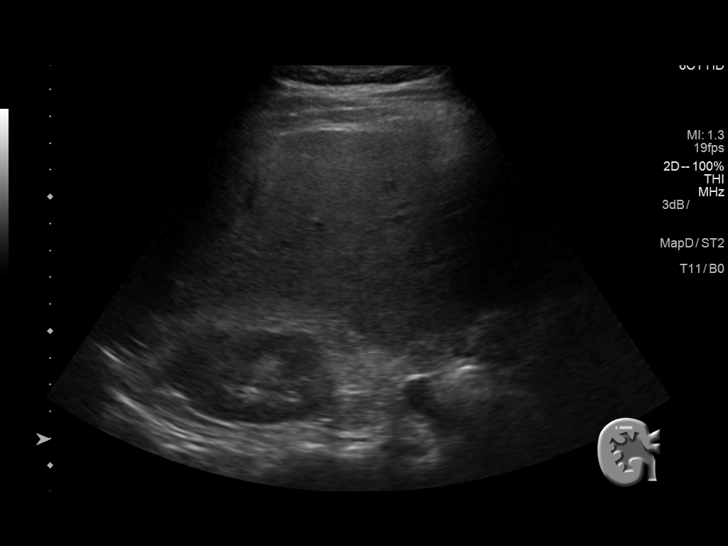
[im 23/51]
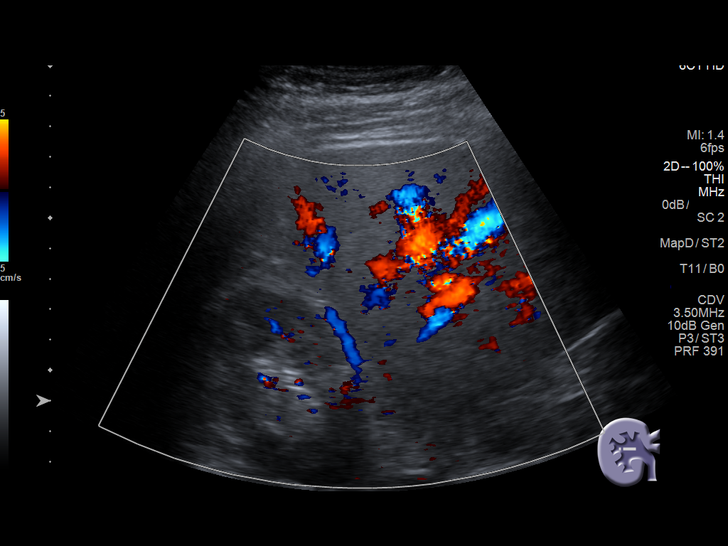
[im 28/51]
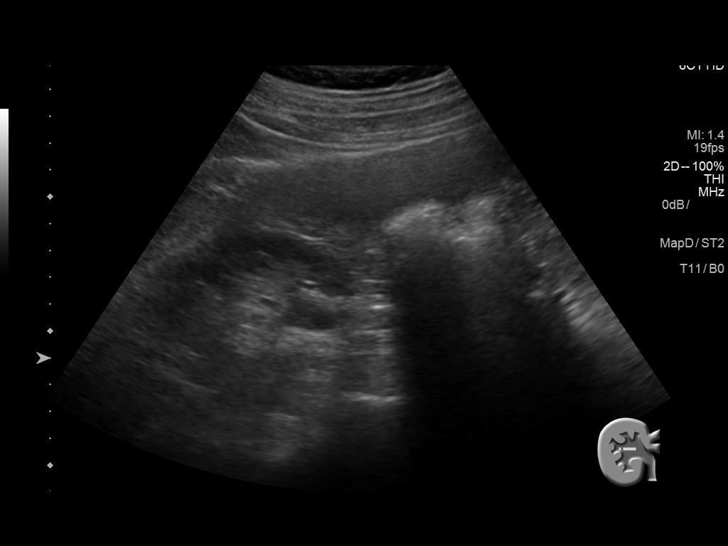
[im 32/51]
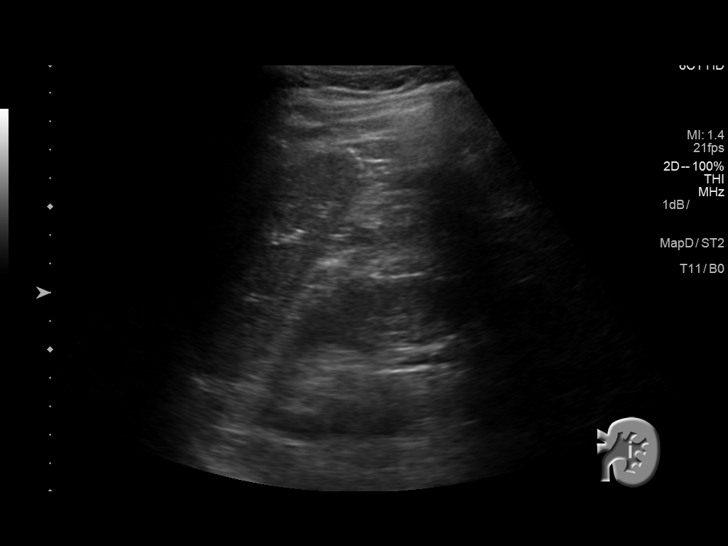
[im 34/51]
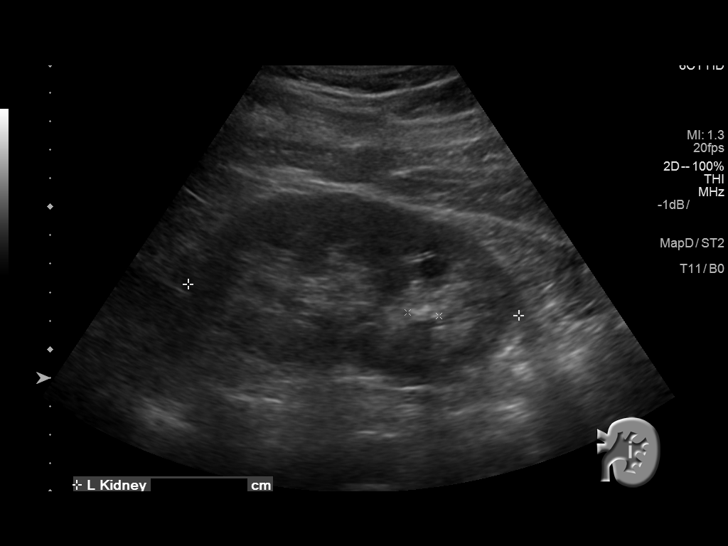
[im 38/51]
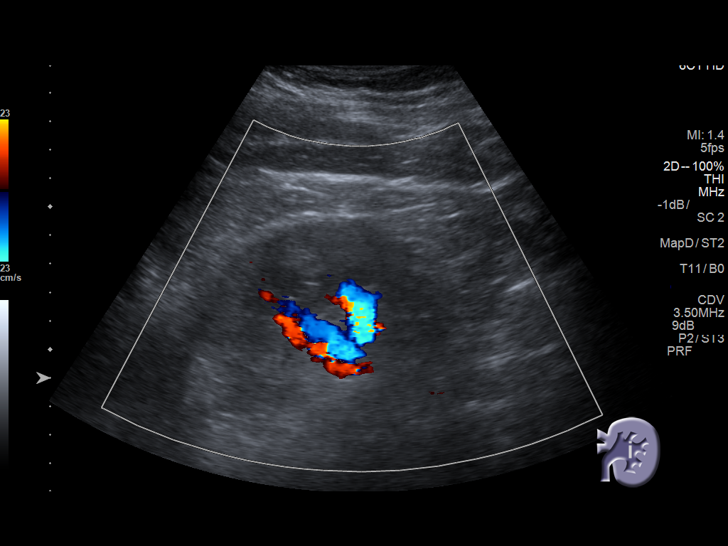
[im 42/51]
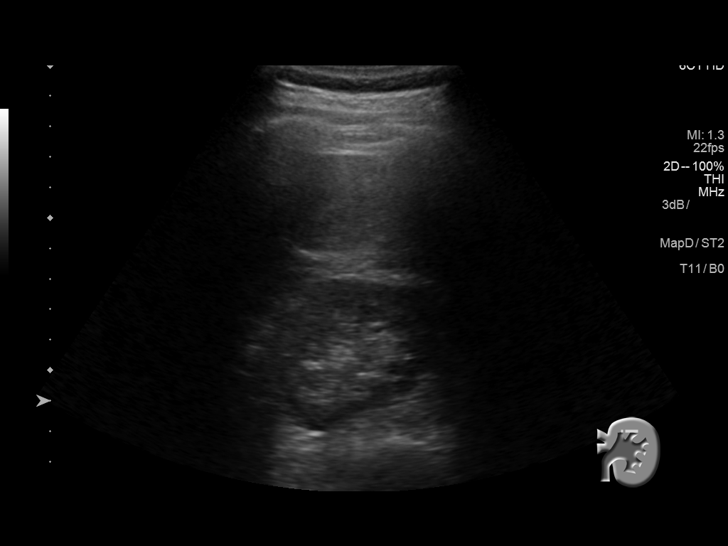
[im 46/51]
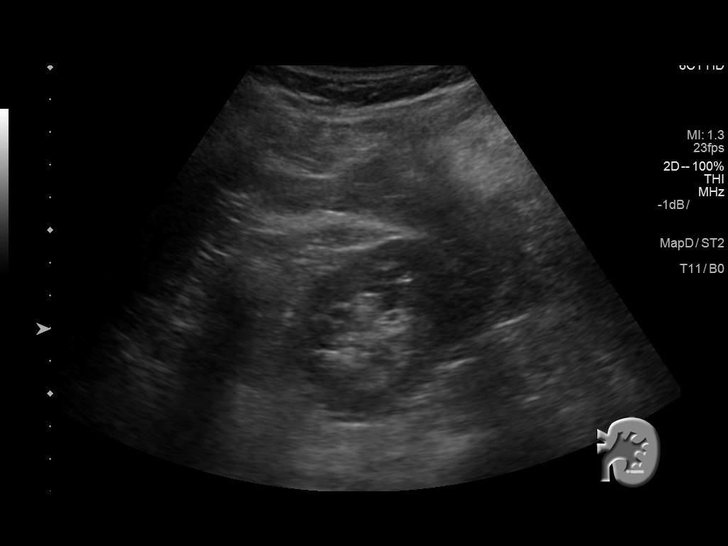
[im 51/51]
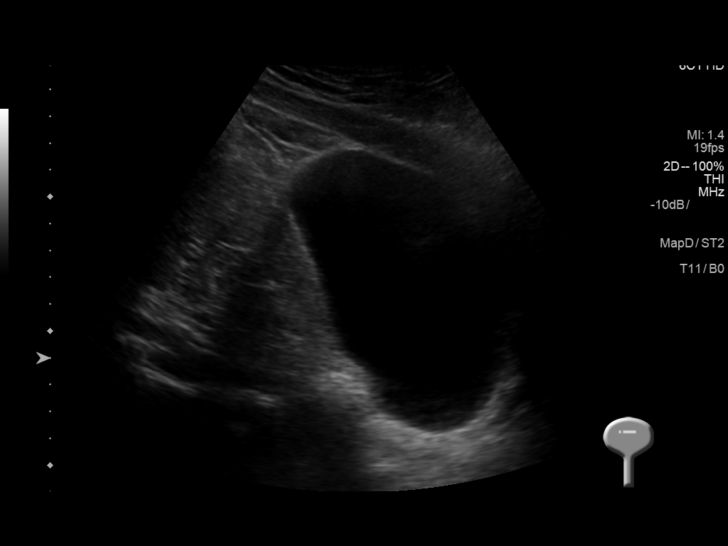

[14 of 25 positions shown; findings below may reference images not displayed]

FINDINGS: Right Kidney:

Length: 12.7 cm. Echogenicity within normal limits. No mass or
hydronephrosis visualized. Incidental note is made of a small vein
coursing along the superior aspect of the left kidney.

Left Kidney:

Length: 12.6 cm. Echogenicity within normal limits. 11 mm lower pole
echogenic focus with shadowing consistent with a renal calculus. No
mass or hydronephrosis visualized.

Bladder:

Appears normal for degree of bladder distention.
IMPRESSION: 1. Nonobstructing left nephrolithiasis.
2. No obstructive uropathy.

## 2017-03-06 IMAGING — CR DG ABDOMEN 1V
1 series · 2 of 2 positions shown · non-contrast
Comparison: CT on 06/25/2015

CLINICAL DATA: Nephrolithiasis. Right ureteral calculus and
hydronephrosis.

EXAM:
ABDOMEN - 1 VIEW

[Series 1: dg abd 1 view · 0.14mm/px · 2 of 2 slices shown]
[im 1/2]
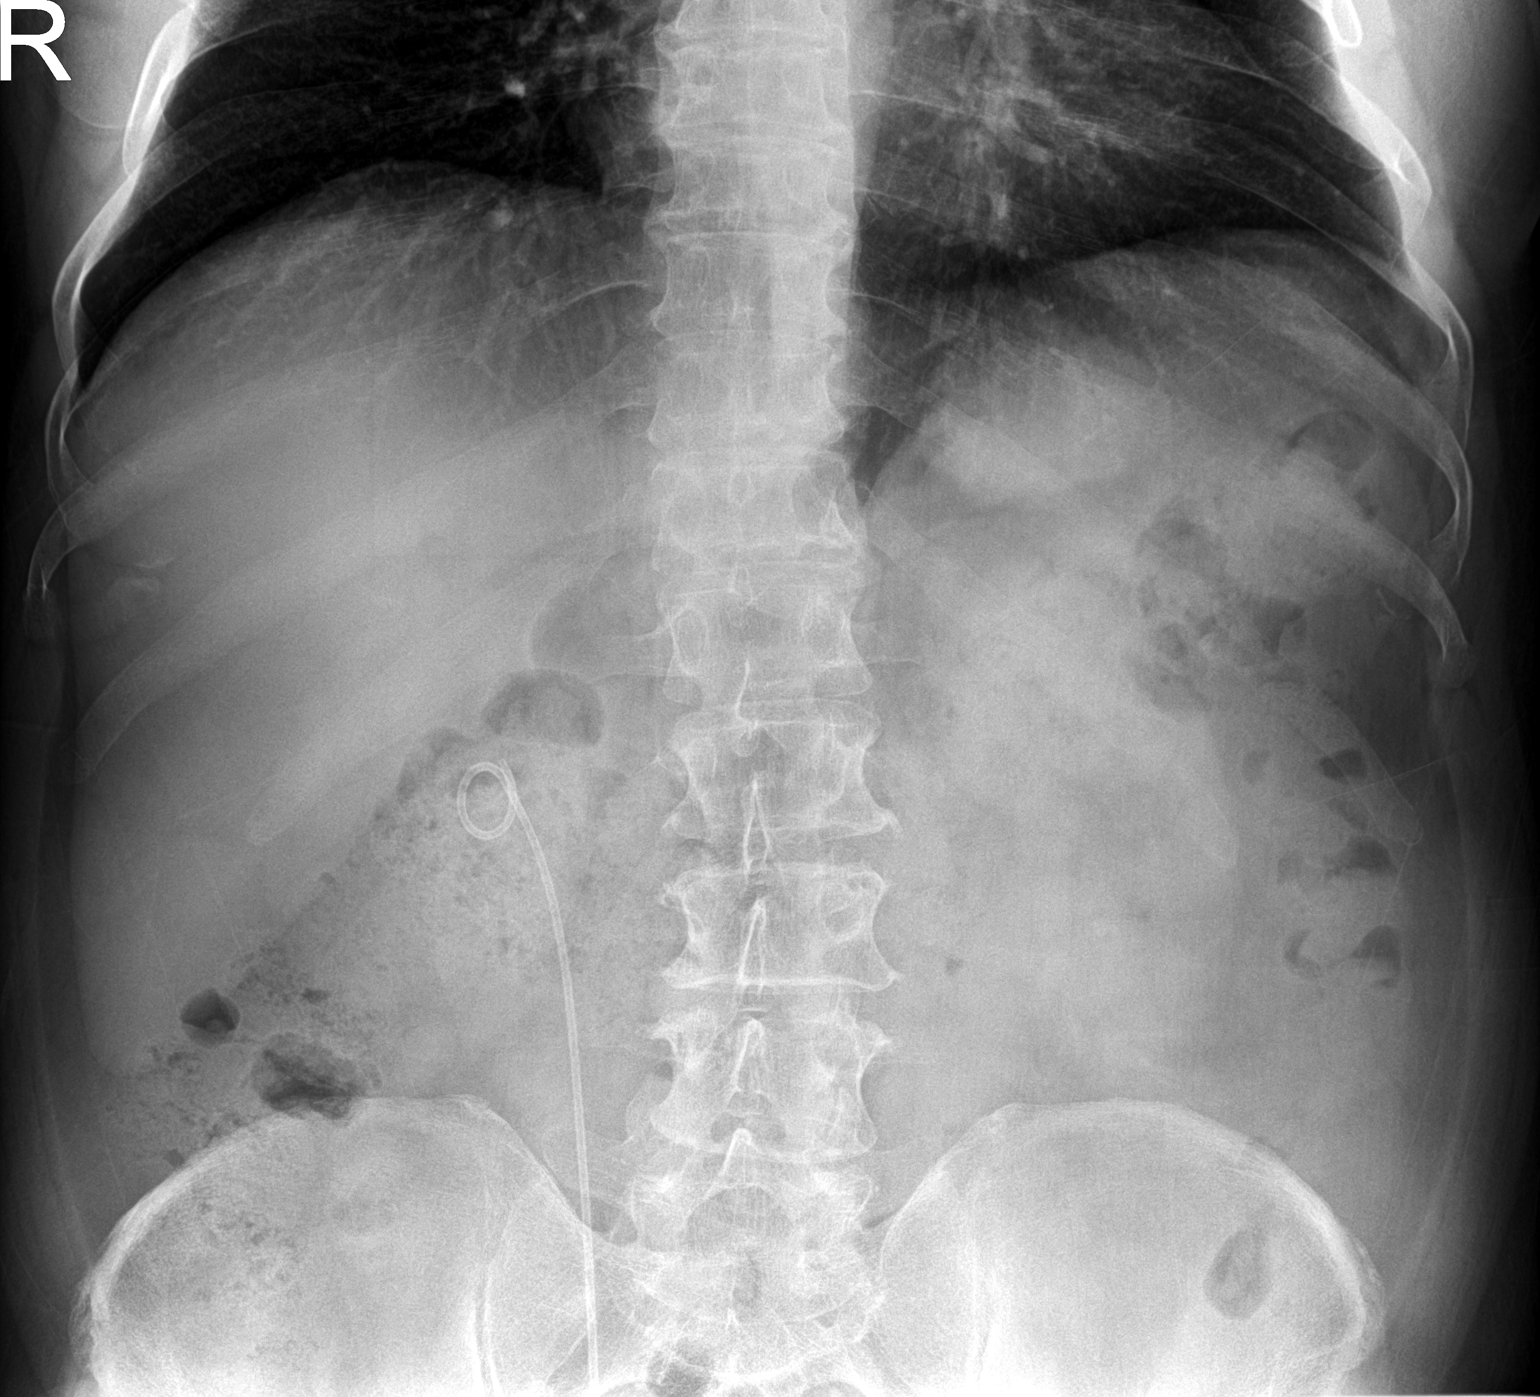
[im 2/2]
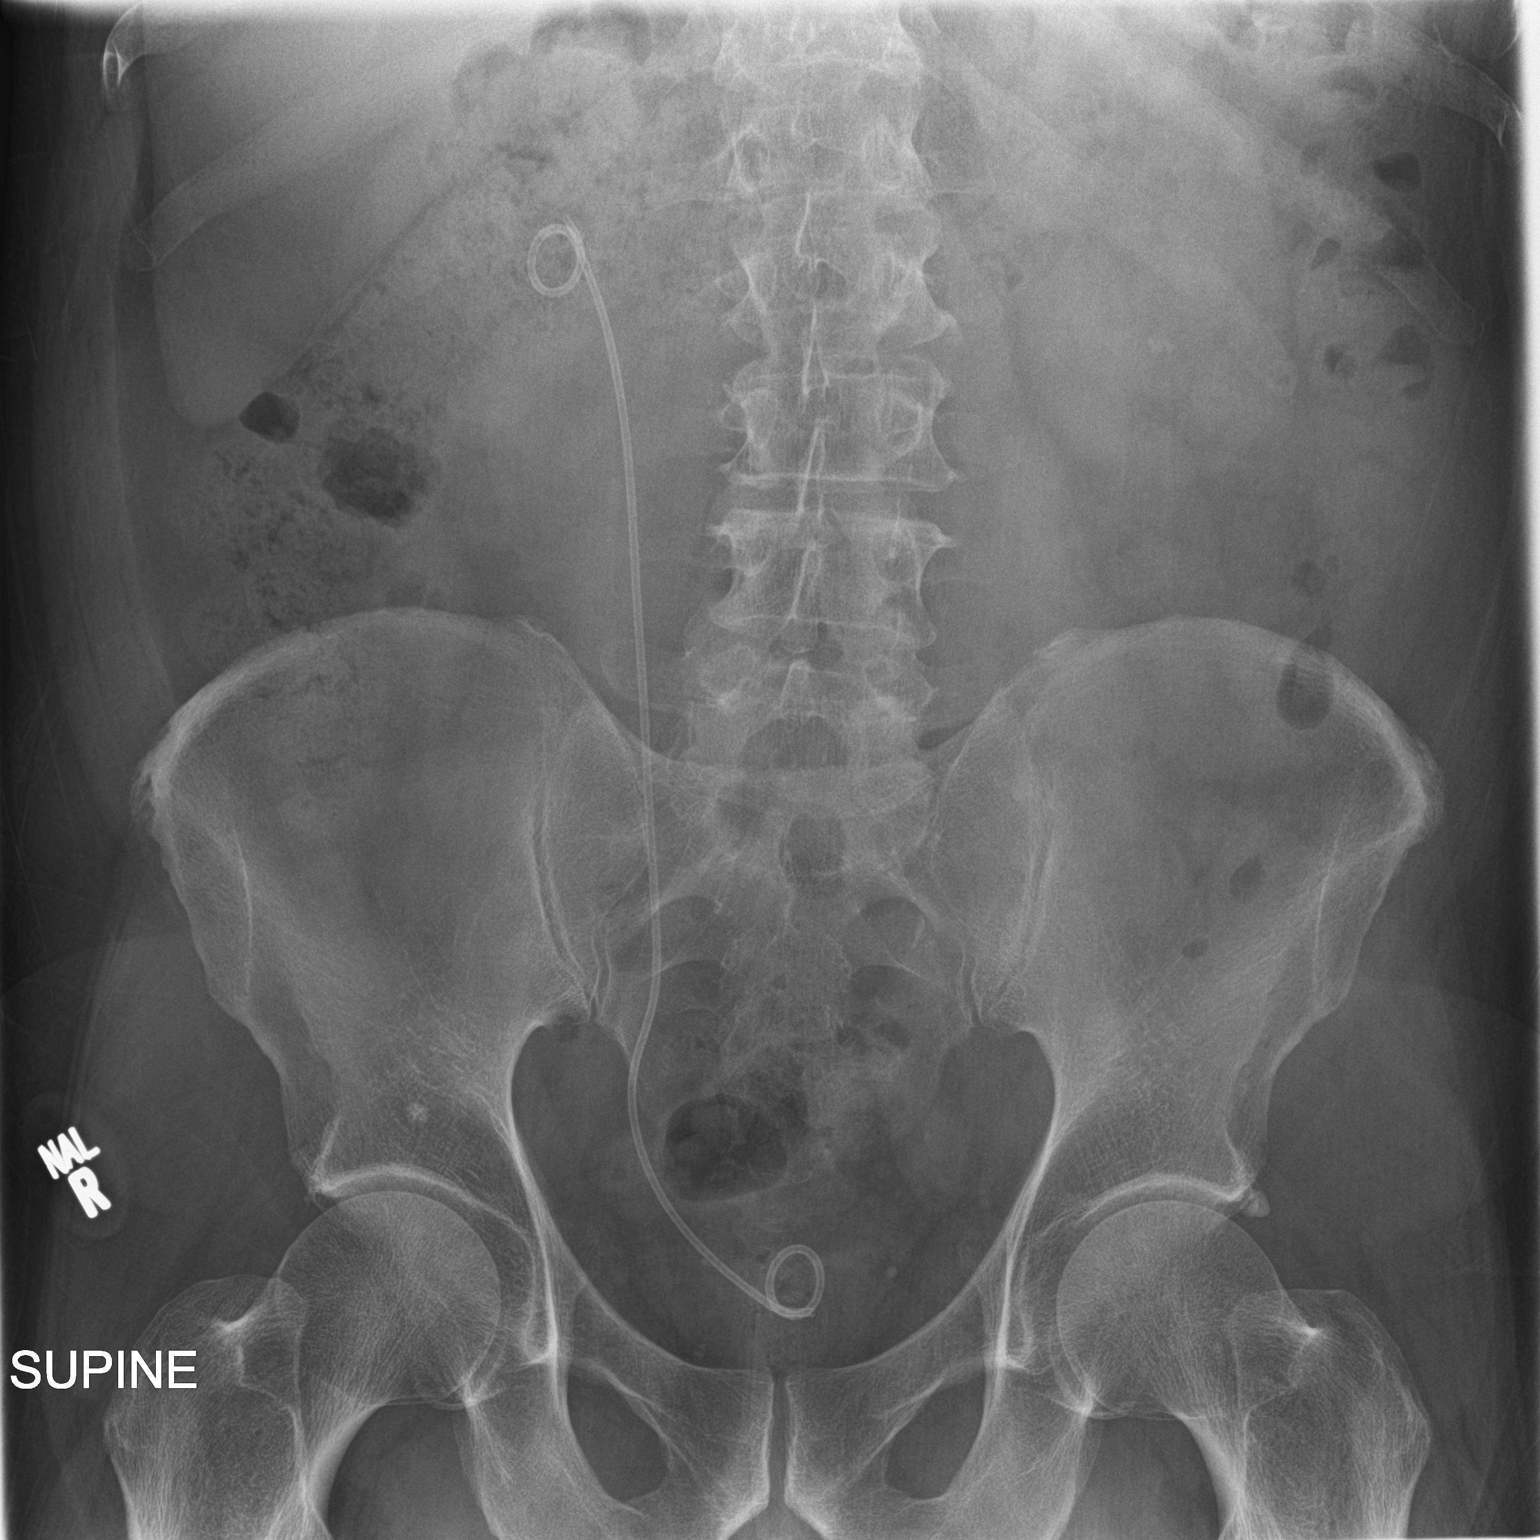

[2 of 2 positions shown; findings below may reference images not displayed]

FINDINGS: A right ureteral stent is seen in appropriate position. Right kidney
is obscured by overlying stool. 7 mm radiopaque calculus noted in
lower pole of left kidney.
IMPRESSION: Right ureteral stent in appropriate position.

7 mm left lower pole intrarenal calculus.

## 2017-03-23 IMAGING — CR DG ABDOMEN 1V
1 series · 1 of 1 positions shown · non-contrast
Comparison: CT abdomen 06/25/2015, KUB 07/18/2015

CLINICAL DATA: Kidney stones, right flank pain

EXAM:
ABDOMEN - 1 VIEW

[dg abd 1 view]
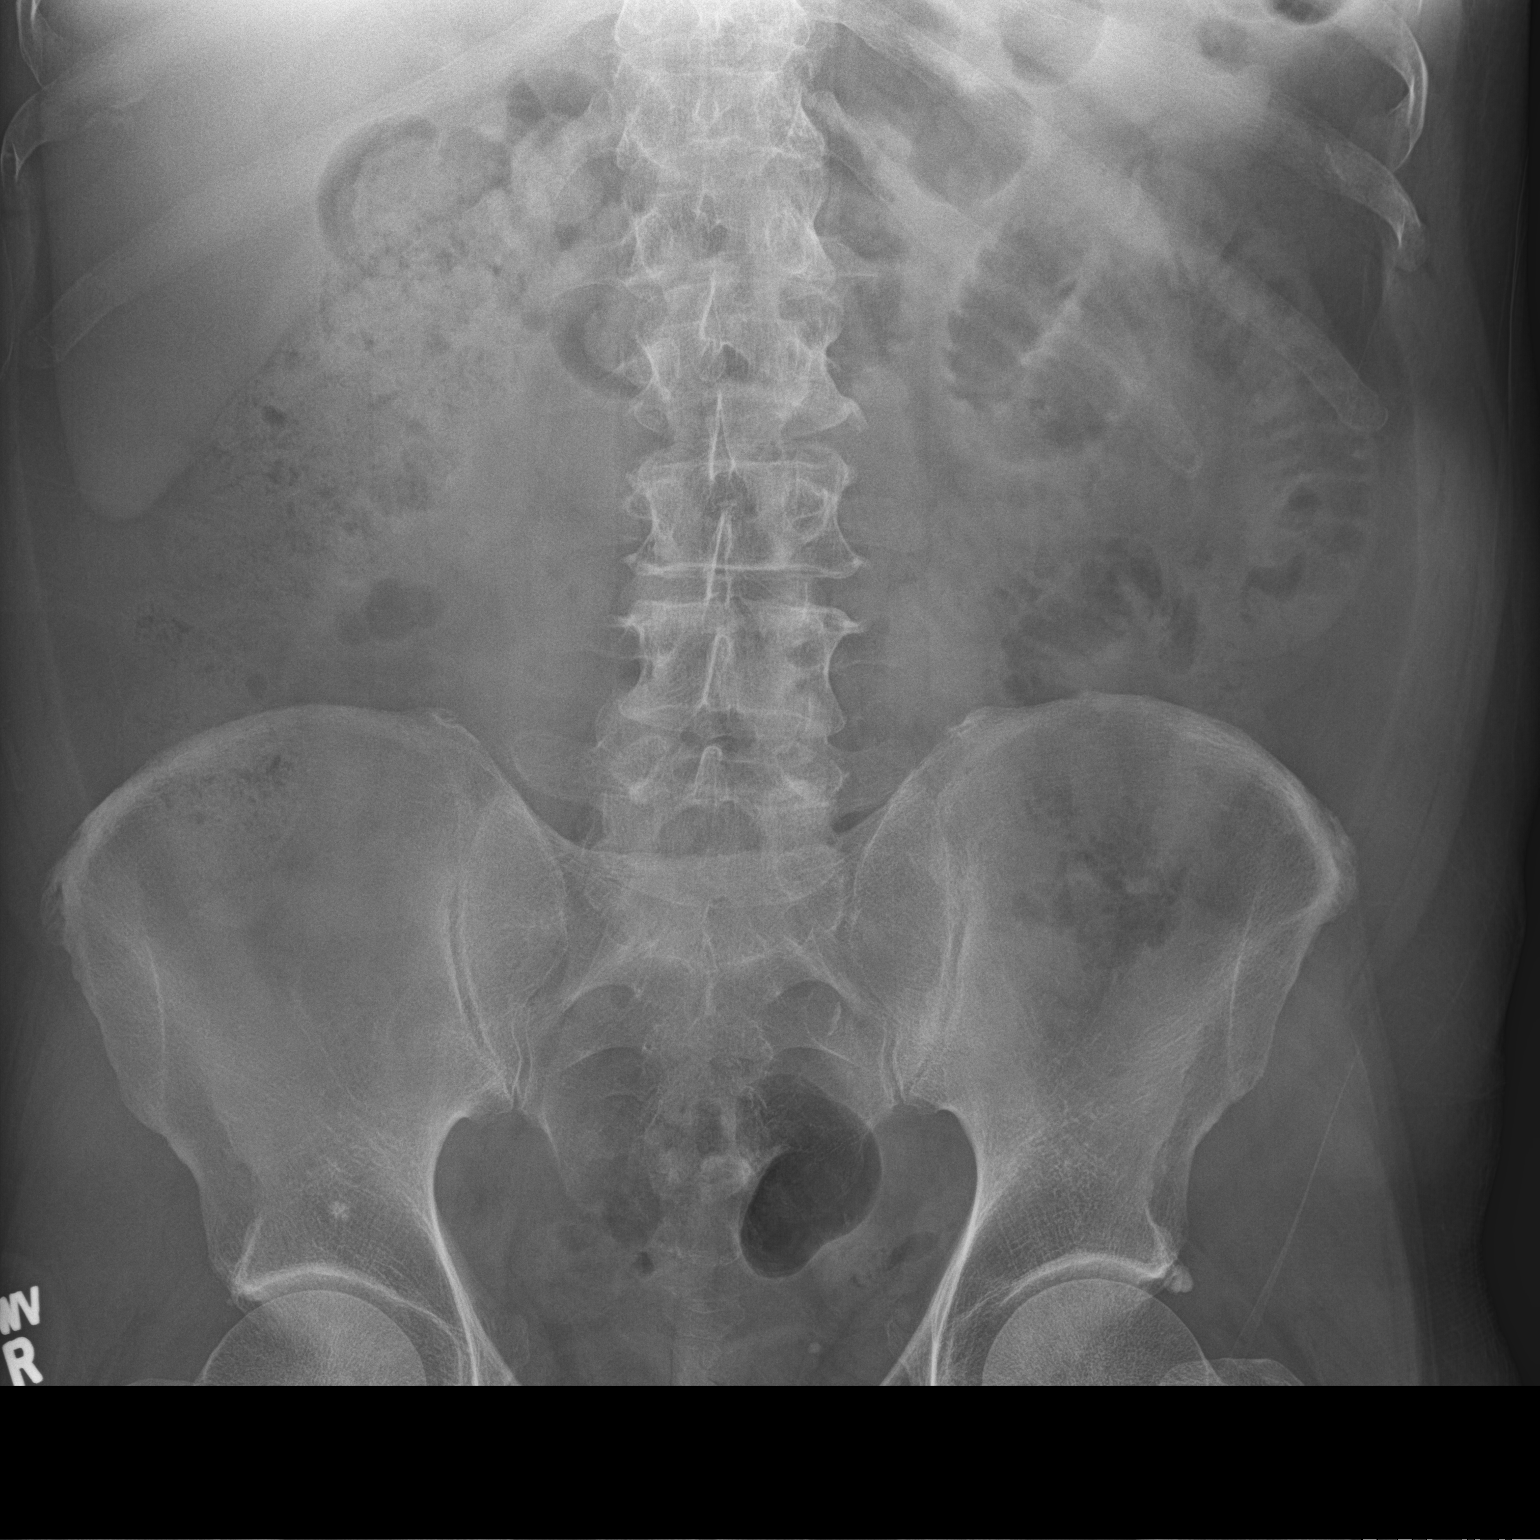

[1 of 1 positions shown; findings below may reference images not displayed]

FINDINGS: There is no bowel dilatation to suggest obstruction. There is no
evidence of pneumoperitoneum, portal venous gas or pneumatosis.
Interval removal of the right ureteral stent. There are no
pathologic calcifications along the expected course of the ureters.
Left inferior pole renal calculus seen by ultrasound is not well
delineated by x-ray. The osseous structures are unremarkable.
IMPRESSION: 1. Left inferior pole renal calculus seen by ultrasound is not well
delineated by x-ray.

2.  Interval removal of the right ureteral stent.

## 2019-10-02 DIAGNOSIS — J309 Allergic rhinitis, unspecified: Secondary | ICD-10-CM | POA: Insufficient documentation

## 2019-10-02 DIAGNOSIS — D497 Neoplasm of unspecified behavior of endocrine glands and other parts of nervous system: Secondary | ICD-10-CM | POA: Insufficient documentation

## 2019-10-02 DIAGNOSIS — Z20822 Contact with and (suspected) exposure to covid-19: Secondary | ICD-10-CM | POA: Insufficient documentation

## 2019-10-02 DIAGNOSIS — I10 Essential (primary) hypertension: Secondary | ICD-10-CM | POA: Insufficient documentation

## 2019-10-02 DIAGNOSIS — Z20828 Contact with and (suspected) exposure to other viral communicable diseases: Secondary | ICD-10-CM | POA: Insufficient documentation

## 2019-10-02 DIAGNOSIS — E7800 Pure hypercholesterolemia, unspecified: Secondary | ICD-10-CM | POA: Insufficient documentation

## 2020-03-31 DIAGNOSIS — M51369 Other intervertebral disc degeneration, lumbar region without mention of lumbar back pain or lower extremity pain: Secondary | ICD-10-CM | POA: Insufficient documentation

## 2020-03-31 DIAGNOSIS — E669 Obesity, unspecified: Secondary | ICD-10-CM | POA: Insufficient documentation

## 2020-03-31 DIAGNOSIS — Z22322 Carrier or suspected carrier of Methicillin resistant Staphylococcus aureus: Secondary | ICD-10-CM | POA: Insufficient documentation

## 2020-06-07 DIAGNOSIS — M5126 Other intervertebral disc displacement, lumbar region: Secondary | ICD-10-CM | POA: Insufficient documentation

## 2020-10-13 ENCOUNTER — Ambulatory Visit: Payer: Self-pay | Admitting: Orthopedic Surgery

## 2020-10-22 HISTORY — PX: INCISION AND DRAINAGE ABSCESS: SHX5864

## 2020-10-22 HISTORY — PX: CHOLECYSTECTOMY: SHX55

## 2020-10-24 ENCOUNTER — Ambulatory Visit: Payer: Self-pay | Admitting: Orthopedic Surgery

## 2020-10-24 NOTE — H&P (Signed)
Aaron Nichols is an 50 y.o. male.   Chief Complaint: back and leg pain HPI: Reported by patient. Reason for Visit: low back Context: work injury (01/27/20); The patient is 8 1/2 months out from injury. Location (Lower Extremity): lower back pain ; right buttock pain Severity: pain level 4/10 Timing: constant Aggravating Factors: walking for Associated Symptoms: numbness/tingling (right foot) Are you working? modified duty; The patient was placed on sedentary duty. Medications: The patient was prescribed Oxycodone  Past Medical History:  Diagnosis Date  . Abnormal LFTs (liver function tests)   . Arthritis   . BP (high blood pressure) 01/26/2015  . 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 Complication of anesthesia 2008   brain tumor 15 hours surgery resulting nausea and vomiting for severeal days  . Complication of anesthesia    pt. woke up before extubation with cystoscopy  . GERD (gastroesophageal reflux disease)   . Hydronephrosis with urinary obstruction due to ureteral calculus 07/03/2015  . Hypertension   . Infection of the upper respiratory tract 04/22/2015  . Kidney stone 1990  . PONV (postoperative nausea and vomiting)   . Right ureteral stone 07/03/2015  . Right-sided Bell's palsy 2008   nerve fatigue    Past Surgical History:  Procedure Laterality Date  . BRAIN TUMOR EXCISION    . CYSTOSCOPY W/ RETROGRADES Left 09/07/2015   Procedure: CYSTOSCOPY WITH RETROGRADE PYELOGRAM;  Surgeon: Hildred Laser, MD;  Location: [facility];  Service: Urology;  Laterality: Left;  . CYSTOSCOPY W/ RETROGRADES Left 10/03/2015   Procedure: CYSTOSCOPY WITH RETROGRADE PYELOGRAM;  Surgeon: Hildred Laser, MD;  Location: [facility];  Service: Urology;  Laterality: Left;  . CYSTOSCOPY WITH STENT PLACEMENT Right 07/06/2015   Procedure: CYSTOSCOPY WITH STENT PLACEMENT;  Surgeon: Lorraine Lax, MD;  Location: [facility];  Service: Urology;  Laterality: Right;  . CYSTOSCOPY WITH STENT PLACEMENT  Left 09/07/2015   Procedure: CYSTOSCOPY WITH STENT PLACEMENT;  Surgeon: Hildred Laser, MD;  Location: [facility];  Service: Urology;  Laterality: Left;  . CYSTOSCOPY WITH STENT PLACEMENT Left 10/03/2015   Procedure: CYSTOSCOPY WITH STENT PLACEMENT/ EXCHANGE/STONE BASKETING;  Surgeon: Hildred Laser, MD;  Location: [facility];  Service: Urology;  Laterality: Left;  . ELBOW FRACTURE SURGERY Left 1998  . FRACTURE SURGERY    . URETEROSCOPY WITH HOLMIUM LASER LITHOTRIPSY Right 07/06/2015   Procedure: URETEROSCOPY ;  Surgeon: Lorraine Lax, MD;  Location: [facility];  Service: Urology;  Laterality: Right;  . URETEROSCOPY WITH HOLMIUM LASER LITHOTRIPSY Right 07/25/2015   Procedure: URETEROSCOPY WITH HOLMIUM LASER LITHOTRIPSY, CYSTOSCOPY,  STENT REMOVAL ;  Surgeon: Lorraine Lax, MD;  Location: [facility];  Service: Urology;  Laterality: Right;  . URETEROSCOPY WITH HOLMIUM LASER LITHOTRIPSY Left 09/07/2015   Procedure: URETEROSCOPY;  Surgeon: Hildred Laser, MD;  Location: [facility];  Service: Urology;  Laterality: Left;  . URETEROSCOPY WITH HOLMIUM LASER LITHOTRIPSY Left 10/03/2015   Procedure: URETEROSCOPY WITH HOLMIUM LASER LITHOTRIPSY;  Surgeon: Hildred Laser, MD;  Location: [facility];  Service: Urology;  Laterality: Left;    Family History  Problem Relation Age of Onset  . Heart disease Father    Social History:  reports that he has been smoking cigarettes. He has been smoking about 1.50 packs per day. He has never used smokeless tobacco. He reports that he does not drink alcohol and does not use drugs.  Allergies:  Allergies  Allergen Reactions  . Ceftin [Cefuroxime Axetil]  Dizziness,lightheaded   Medications: amLODIPine 2.5 mg tablet amLODIPine 5 mg tablet atorvastatin 20 mg tablet chlorhexidine gluconate 4 % topical liquid doxycycline hyclate 100 mg capsule famotidine lisinopriL 20 mg tablet mupirocin 2 % topical ointment oxyCODONE-acetaminophen 5 mg-325 mg  tablet varenicline 0.5 mg tablet  Review of Systems  Constitutional: Negative.   HENT: Negative.   Eyes: Negative.   Respiratory: Negative.   Cardiovascular: Negative.   Gastrointestinal: Negative.   Endocrine: Negative.   Genitourinary: Negative.   Musculoskeletal: Positive for back pain.  Skin: Negative.   Neurological: Positive for weakness and numbness.  Hematological: Negative.   Psychiatric/Behavioral: Negative.     There were no vitals taken for this visit. Physical Exam Constitutional:      Appearance: Normal appearance.  HENT:     Head: Normocephalic.     Right Ear: External ear normal.     Left Ear: External ear normal.     Nose: Nose normal.     Mouth/Throat:     Pharynx: Oropharynx is clear.  Eyes:     Conjunctiva/sclera: Conjunctivae normal.  Cardiovascular:     Rate and Rhythm: Normal rate.     Pulses: Normal pulses.  Pulmonary:     Effort: Pulmonary effort is normal.  Abdominal:     General: Bowel sounds are normal.  Musculoskeletal:     Cervical back: Normal range of motion.     Comments: Patient is a 50 year old male.  Gait and Station: Appearance: ambulating with no assistive devices and antalgic gait.  Constitutional: General Appearance: healthy-appearing and distress (mild).  Psychiatric: Mood and Affect: active and alert.  Cardiovascular System: Edema Right: none; Dorsalis and posterior tibial pulses 2+. Edema Left: none.  Abdomen: Inspection and Palpation: non-distended and no tenderness.  Skin: Inspection and palpation: no rash.  Lumbar Spine: Inspection: normal alignment. Bony Palpation of the Lumbar Spine: tender at lumbosacral junction.. Bony Palpation of the Right Hip: no tenderness of the greater trochanter and tenderness of the SI joint; Pelvis stable. Bony Palpation of the Left Hip: no tenderness of the greater trochanter and tenderness of the SI joint. Soft Tissue Palpation on the Right: No flank pain with percussion. Active Range  of Motion: limited flexion and extention.  Motor Strength: L1 Motor Strength on the Right: hip flexion iliopsoas 5/5. L1 Motor Strength on the Left: hip flexion iliopsoas 5/5. L2-L4 Motor Strength on the Right: knee extension quadriceps 4/5. L2-L4 Motor Strength on the Left: knee extension quadriceps 5/5. L5 Motor Strength on the Right: ankle dorsiflexion tibialis anterior 5/5 and great toe extension extensor hallucis longus 5/5. L5 Motor Strength on the Left: ankle dorsiflexion tibialis anterior 5/5 and great toe extension extensor hallucis longus 5/5. S1 Motor Strength on the Right: plantar flexion gastrocnemius 5/5. S1 Motor Strength on the Left: plantar flexion gastrocnemius 5/5.  Neurological System: Knee Reflex Right: normal (2). Knee Reflex Left: normal (2). Ankle Reflex Right: normal (2). Ankle Reflex Left: normal (2). Babinski Reflex Right: plantar reflex absent. Babinski Reflex Left: plantar reflex absent. Sensation on the Right: normal distal extremities. Sensation on the Left: normal distal extremities. Special Tests on the Right: no clonus of the ankle/knee and seated straight leg raising test positive. Special Tests on the Left: no clonus of the ankle/knee.  Skin:    General: Skin is warm and dry.  Neurological:     Mental Status: He is alert.      Assessment/Plan Impression:  Patient has neurogenic claudication. Spinal stenosis multifactorial L3-4.  Plan:  Failing  conservative treatment with persistent radicular pain we discussed as p ?Impression:  Patient has neurogenic claudication. Spinal stenosis multifactorial L3-4.  Plan:  Failing conservative treatment with persistent radicular pain we discussed as previously lumbar decompression  I had an extensive discussion with the patient concerning the pathology relevant anatomy and treatment options. At this point exhausting conservative treatment and in the presence of a neurologic deficit we discussed microlumbar  decompression. I discussed the risks and benefits including bls<?????O??my and lumbar fusion. I also indicated that this is an operation to basically decompress the nerve root to allow recovery as opposed to fixing a herniated disc and that the incidence of recurrent chest disc herniation can approach 15%. Also that nerve root recovery is variable and may not recover completely.  I discussed the operative course including overnight in the hospital. Immediate ambulation. Follow-up in 2 weeks for suture removal. 6 weeks until healing of the herniation followed by 6 weeks of reconditioning and strengthening of the core musculature. Also discussed the need to employ the concepts of disc pressure management and core motion following the surgery to minimize the risk of recurrent disc herniation. We will obtain preoperative clearance i if necessary and proceed accordingly.  A prescription for an opioid was given to be taken as directed for pain control. I discussed the risks including cognitive changes which may affect the ability to operate machinery and to drive. In addition the side effect of constipation as well as to avoid taking with conflicting medications that were discussed. The patient's prescription drug monitoring report was reviewed with no red flags noted. Only to take 1 a day  Potential exposure to MRSA. Preoperative decolonization. Vancomycin Kefzol preoperatively.  Plan microlumbar decompression L3-4  Dorothy Spark, PA-C for Dr. Shelle Iron 10/24/2020, 12:16 PM

## 2020-11-23 ENCOUNTER — Ambulatory Visit: Payer: Self-pay | Admitting: Orthopedic Surgery

## 2020-11-23 NOTE — H&P (Signed)
Aaron Nichols is an 50 y.o. male.   Chief Complaint: back and leg pain HPI: Reason for Visit: low back Context: work injury (01/27/20); The patient is 8 1/2 months out from injury. Location (Lower Extremity): lower back pain ; right buttock pain Severity: pain level 4/10 Timing: constant Aggravating Factors: walking for Associated Symptoms: numbness/tingling (right foot) Are you working? modified duty; The patient was placed on sedentary duty. Medications: The patient was prescribed Oxycodone  Past Medical History:  Diagnosis Date  . Abnormal LFTs (liver function tests)   . Arthritis   . BP (high blood pressure) 01/26/2015  . Brain tumor ([place])    acoustic neuroma  . Complication of anesthesia 2008   brain tumor 15 hours surgery resulting nausea and vomiting for severeal days  . Complication of anesthesia    pt. woke up before extubation with cystoscopy  . GERD (gastroesophageal reflux disease)   . Hydronephrosis with urinary obstruction due to ureteral calculus 07/03/2015  . Hypertension   . Infection of the upper respiratory tract 04/22/2015  . Kidney stone 1990  . PONV (postoperative nausea and vomiting)   . Right ureteral stone 07/03/2015  . Right-sided Bell's palsy 2008   nerve fatigue    Past Surgical History:  Procedure Laterality Date  . BRAIN TUMOR EXCISION    . CYSTOSCOPY W/ RETROGRADES Left 09/07/2015   Procedure: CYSTOSCOPY WITH RETROGRADE PYELOGRAM;  Surgeon: Nickie Retort, MD;  Location: [facility];  Service: Urology;  Laterality: Left;  . CYSTOSCOPY W/ RETROGRADES Left 10/03/2015   Procedure: CYSTOSCOPY WITH RETROGRADE PYELOGRAM;  Surgeon: Nickie Retort, MD;  Location: [facility];  Service: Urology;  Laterality: Left;  . CYSTOSCOPY WITH STENT PLACEMENT Right 07/06/2015   Procedure: CYSTOSCOPY WITH STENT PLACEMENT;  Surgeon: Collier Flowers, MD;  Location: [facility];  Service: Urology;  Laterality: Right;  . CYSTOSCOPY WITH STENT PLACEMENT Left 09/07/2015    Procedure: CYSTOSCOPY WITH STENT PLACEMENT;  Surgeon: Nickie Retort, MD;  Location: [facility];  Service: Urology;  Laterality: Left;  . CYSTOSCOPY WITH STENT PLACEMENT Left 10/03/2015   Procedure: CYSTOSCOPY WITH STENT PLACEMENT/ EXCHANGE/STONE BASKETING;  Surgeon: Nickie Retort, MD;  Location: [facility];  Service: Urology;  Laterality: Left;  . ELBOW FRACTURE SURGERY Left 1998  . FRACTURE SURGERY    . URETEROSCOPY WITH HOLMIUM LASER LITHOTRIPSY Right 07/06/2015   Procedure: URETEROSCOPY ;  Surgeon: Collier Flowers, MD;  Location: [facility];  Service: Urology;  Laterality: Right;  . URETEROSCOPY WITH HOLMIUM LASER LITHOTRIPSY Right 07/25/2015   Procedure: URETEROSCOPY WITH HOLMIUM LASER LITHOTRIPSY, CYSTOSCOPY,  STENT REMOVAL ;  Surgeon: Collier Flowers, MD;  Location: [facility];  Service: Urology;  Laterality: Right;  . URETEROSCOPY WITH HOLMIUM LASER LITHOTRIPSY Left 09/07/2015   Procedure: URETEROSCOPY;  Surgeon: Nickie Retort, MD;  Location: [facility];  Service: Urology;  Laterality: Left;  . URETEROSCOPY WITH HOLMIUM LASER LITHOTRIPSY Left 10/03/2015   Procedure: URETEROSCOPY WITH HOLMIUM LASER LITHOTRIPSY;  Surgeon: Nickie Retort, MD;  Location: [facility];  Service: Urology;  Laterality: Left;    Family History  Problem Relation Age of Onset  . Heart disease Father    Social History:  reports that he has been smoking cigarettes. He has been smoking about 1.50 packs per day. He has never used smokeless tobacco. He reports that he does not drink alcohol and does not use drugs.  Allergies:  Allergies  Allergen Reactions  . Ceftin [Cefuroxime Axetil]     Dizziness,lightheaded    (  Not in a hospital admission)   No results found for this or any previous visit (from the past 48 hour(s)). No results found.  Review of Systems  Constitutional: Negative.   HENT: Negative.   Eyes: Negative.   Respiratory: Negative.   Cardiovascular: Negative.   Gastrointestinal: Negative.    Endocrine: Negative.   Genitourinary: Negative.   Musculoskeletal: Positive for back pain.  Neurological: Positive for weakness and numbness.    There were no vitals taken for this visit. Physical Exam Constitutional:      Appearance: Normal appearance.  HENT:     Head: Normocephalic.     Right Ear: External ear normal.     Left Ear: External ear normal.     Nose: Nose normal.     Mouth/Throat:     Mouth: Mucous membranes are moist.  Cardiovascular:     Rate and Rhythm: Normal rate.     Pulses: Normal pulses.  Pulmonary:     Effort: Pulmonary effort is normal.  Abdominal:     General: Abdomen is flat.  Musculoskeletal:     Cervical back: Normal range of motion.     Comments: Patient is a 50 year old male.  Gait and Station: Appearance: ambulating with no assistive devices and antalgic gait.  Constitutional: General Appearance: healthy-appearing and distress (mild).  Psychiatric: Mood and Affect: active and alert.  Cardiovascular System: Edema Right: none; Dorsalis and posterior tibial pulses 2+. Edema Left: none.  Abdomen: Inspection and Palpation: non-distended and no tenderness.  Skin: Inspection and palpation: no rash.  Lumbar Spine: Inspection: normal alignment. Bony Palpation of the Lumbar Spine: tender at lumbosacral junction.. Bony Palpation of the Right Hip: no tenderness of the greater trochanter and tenderness of the SI joint; Pelvis stable. Bony Palpation of the Left Hip: no tenderness of the greater trochanter and tenderness of the SI joint. Soft Tissue Palpation on the Right: No flank pain with percussion. Active Range of Motion: limited flexion and extention.  Motor Strength: L1 Motor Strength on the Right: hip flexion iliopsoas 5/5. L1 Motor Strength on the Left: hip flexion iliopsoas 5/5. L2-L4 Motor Strength on the Right: knee extension quadriceps 4/5. L2-L4 Motor Strength on the Left: knee extension quadriceps 5/5. L5 Motor Strength on the Right: ankle  dorsiflexion tibialis anterior 5/5 and great toe extension extensor hallucis longus 5/5. L5 Motor Strength on the Left: ankle dorsiflexion tibialis anterior 5/5 and great toe extension extensor hallucis longus 5/5. S1 Motor Strength on the Right: plantar flexion gastrocnemius 5/5. S1 Motor Strength on the Left: plantar flexion gastrocnemius 5/5.  Neurological System: Knee Reflex Right: normal (2). Knee Reflex Left: normal (2). Ankle Reflex Right: normal (2). Ankle Reflex Left: normal (2). Babinski Reflex Right: plantar reflex absent. Babinski Reflex Left: plantar reflex absent. Sensation on the Right: normal distal extremities. Sensation on the Left: normal distal extremities. Special Tests on the Right: no clonus of the ankle/knee and seated straight leg raising test positive. Special Tests on the Left: no clonus of the ankle/knee.  Neurological:     Mental Status: He is alert.     Assessment/Plan  Impression:  Patient has neurogenic claudication. Spinal stenosis multifactorial L3-4.  Plan:  Failing conservative treatment with persistent radicular pain we discussed as previously lumbar decompression  I had an extensive discussion with the patient concerning the pathology relevant anatomy and treatment options. At this point exhausting conservative treatment and in the presence of a neurologic deficit we discussed microlumbar decompression. I discussed the risks and benefits including  bleeding, infection, DVT, PE, anesthetic complications, worsening in their symptoms, improvement in their symptoms, C SF leakage, epidural fibrosis, need for future surgeries such as revision discectomy and lumbar fusion. I also indicated that this is an operation to basically decompress the nerve root to allow recovery as opposed to fixing a herniated disc and that the incidence of recurrent chest disc herniation can approach 15%. Also that nerve root recovery is variable and may not recover completely.  I discussed  the operative course including overnight in the hospital. Immediate ambulation. Follow-up in 2 weeks for suture removal. 6 weeks until healing of the herniation followed by 6 weeks of reconditioning and strengthening of the core musculature. Also discussed the need to employ the concepts of disc pressure management and core motion following the surgery to minimize the risk of recurrent disc herniation. We will obtain preoperative clearance i if necessary and proceed accordingly.  A prescription for an opioid was given to be taken as directed for pain control. I discussed the risks including cognitive changes which may affect the ability to operate machinery and to drive. In addition the side effect of constipation as well as to avoid taking with conflicting medications that were discussed. The patient's prescription drug monitoring report was reviewed with no red flags noted. Only to take 1 a day  Potential exposure to MRSA. Preoperative decolonization. Vancomycin Kefzol preoperatively.  Plan microlumbar decompression L3-4  Cecilie Kicks, PA-C for Dr. Tonita Cong 11/23/2020, 11:08 AM

## 2020-12-09 ENCOUNTER — Encounter (HOSPITAL_COMMUNITY): Admission: RE | Payer: Self-pay | Source: Home / Self Care

## 2020-12-09 ENCOUNTER — Ambulatory Visit (HOSPITAL_COMMUNITY): Admission: RE | Admit: 2020-12-09 | Payer: 59 | Source: Home / Self Care | Admitting: Specialist

## 2020-12-09 DIAGNOSIS — L02215 Cutaneous abscess of perineum: Secondary | ICD-10-CM | POA: Insufficient documentation

## 2020-12-09 SURGERY — LUMBAR LAMINECTOMY/DECOMPRESSION MICRODISCECTOMY 1 LEVEL
Anesthesia: General

## 2021-02-22 ENCOUNTER — Other Ambulatory Visit: Payer: Self-pay | Admitting: Family Medicine

## 2021-02-22 DIAGNOSIS — R748 Abnormal levels of other serum enzymes: Secondary | ICD-10-CM

## 2021-04-04 ENCOUNTER — Other Ambulatory Visit: Payer: Self-pay

## 2021-04-04 ENCOUNTER — Ambulatory Visit
Admission: RE | Admit: 2021-04-04 | Discharge: 2021-04-04 | Disposition: A | Discharge status: Home / Self Care | Payer: 59 | Source: Ambulatory Visit | Attending: Family Medicine | Admitting: Family Medicine

## 2021-04-04 DIAGNOSIS — R748 Abnormal levels of other serum enzymes: Secondary | ICD-10-CM | POA: Insufficient documentation

## 2021-08-15 DIAGNOSIS — K8050 Calculus of bile duct without cholangitis or cholecystitis without obstruction: Secondary | ICD-10-CM | POA: Insufficient documentation

## 2021-11-01 ENCOUNTER — Ambulatory Visit: Payer: Self-pay | Admitting: Orthopedic Surgery

## 2021-11-01 DIAGNOSIS — M48062 Spinal stenosis, lumbar region with neurogenic claudication: Secondary | ICD-10-CM

## 2021-12-28 ENCOUNTER — Ambulatory Visit: Payer: Self-pay | Admitting: Orthopedic Surgery

## 2021-12-28 NOTE — H&P (Signed)
Aaron Nichols is an 51 y.o. male.   ?Chief Complaint: back and leg pain ?HPI: Reason for Visit: low back ?Context: The patient is 1 year, 8 months out ?Location (Lower Extremity): lower back pain ; leg pain on the right, , ?Severity: pain level 4/10 ?Timing: come and go ?Aggravating Factors: sitting for ?Associated Symptoms: numbness/tingling ?Medications: Percocet 5/325 ? ?Past Medical History:  ?Diagnosis Date  ? Abnormal LFTs (liver function tests)   ? Arthritis   ? BP (high blood pressure) 01/26/2015  ? 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 Complication of anesthesia 2008  ? brain tumor 15 hours surgery resulting nausea and vomiting for severeal days  ? Complication of anesthesia   ? pt. woke up before extubation with cystoscopy  ? GERD (gastroesophageal reflux disease)   ? Hydronephrosis with urinary obstruction due to ureteral calculus 07/03/2015  ? Hypertension   ? Infection of the upper respiratory tract 04/22/2015  ? Kidney stone 1990  ? PONV (postoperative nausea and vomiting)   ? Right ureteral stone 07/03/2015  ? Right-sided Bell's palsy 2008  ? nerve fatigue  ? ? ?Past Surgical History:  ?Procedure Laterality Date  ? BRAIN TUMOR EXCISION    ? CYSTOSCOPY W/ RETROGRADES Left 09/07/2015  ? Procedure: CYSTOSCOPY WITH RETROGRADE PYELOGRAM;  Surgeon: Nickie Retort, MD;  Location: [facility];  Service: Urology;  Laterality: Left;  ? CYSTOSCOPY W/ RETROGRADES Left 10/03/2015  ? Procedure: CYSTOSCOPY WITH RETROGRADE PYELOGRAM;  Surgeon: Nickie Retort, MD;  Location: [facility];  Service: Urology;  Laterality: Left;  ? CYSTOSCOPY WITH STENT PLACEMENT Right 07/06/2015  ? Procedure: CYSTOSCOPY WITH STENT PLACEMENT;  Surgeon: Collier Flowers, MD;  Location: [facility];  Service: Urology;  Laterality: Right;  ? CYSTOSCOPY WITH STENT PLACEMENT Left 09/07/2015  ? Procedure: CYSTOSCOPY WITH STENT PLACEMENT;  Surgeon: Nickie Retort, MD;  Location: [facility];  Service: Urology;  Laterality: Left;  ? CYSTOSCOPY WITH  STENT PLACEMENT Left 10/03/2015  ? Procedure: CYSTOSCOPY WITH STENT PLACEMENT/ EXCHANGE/STONE BASKETING;  Surgeon: Nickie Retort, MD;  Location: [facility];  Service: Urology;  Laterality: Left;  ? ELBOW FRACTURE SURGERY Left 1998  ? FRACTURE SURGERY    ? URETEROSCOPY WITH HOLMIUM LASER LITHOTRIPSY Right 07/06/2015  ? Procedure: URETEROSCOPY ;  Surgeon: Collier Flowers, MD;  Location: [facility];  Service: Urology;  Laterality: Right;  ? URETEROSCOPY WITH HOLMIUM LASER LITHOTRIPSY Right 07/25/2015  ? Procedure: URETEROSCOPY WITH HOLMIUM LASER LITHOTRIPSY, CYSTOSCOPY,  STENT REMOVAL ;  Surgeon: Collier Flowers, MD;  Location: [facility];  Service: Urology;  Laterality: Right;  ? URETEROSCOPY WITH HOLMIUM LASER LITHOTRIPSY Left 09/07/2015  ? Procedure: URETEROSCOPY;  Surgeon: Nickie Retort, MD;  Location: [facility];  Service: Urology;  Laterality: Left;  ? URETEROSCOPY WITH HOLMIUM LASER LITHOTRIPSY Left 10/03/2015  ? Procedure: URETEROSCOPY WITH HOLMIUM LASER LITHOTRIPSY;  Surgeon: Nickie Retort, MD;  Location: [facility];  Service: Urology;  Laterality: Left;  ? ? ?Family History  ?Problem Relation Age of Onset  ? Heart disease Father   ? ?Social History:  reports that he has been smoking cigarettes. He has been smoking an average of 1.5 packs per day. He has never used smokeless tobacco. He reports that he does not drink alcohol and does not use drugs. ? ?Allergies:  ?Allergies  ?Allergen Reactions  ? Ceftin [Cefuroxime Axetil]   ?  Dizziness,lightheaded  ? ?Current meds: ?Aller-Tec 10 mg tablet ?amLODIPine 5 mg tablet ?atorvastatin 20 mg  tablet ?famotidine ?lisinopriL 20 mg tablet ?oxyCODONE-acetaminophen 5 mg-325 mg tablet ? ?Review of Systems  ?Constitutional: Negative.   ?HENT: Negative.    ?Eyes: Negative.   ?Respiratory: Negative.    ?Cardiovascular: Negative.   ?Gastrointestinal: Negative.   ?Endocrine: Negative.   ?Genitourinary: Negative.   ?Musculoskeletal:  Positive for back pain and gait problem.   ?Skin: Negative.   ?Neurological:  Positive for weakness and numbness.  ?Psychiatric/Behavioral: Negative.    ? ?There were no vitals taken for this visit. ?Physical Exam ?Constitutional:   ?   Appearance: Normal appearance.  ?HENT:  ?   Head: Normocephalic and atraumatic.  ?   Right Ear: External ear normal.  ?   Left Ear: External ear normal.  ?   Nose: Nose normal.  ?   Mouth/Throat:  ?   Pharynx: Oropharynx is clear.  ?Eyes:  ?   Conjunctiva/sclera: Conjunctivae normal.  ?Cardiovascular:  ?   Rate and Rhythm: Normal rate and regular rhythm.  ?   Pulses: Normal pulses.  ?Pulmonary:  ?   Effort: Pulmonary effort is normal.  ?   Breath sounds: Normal breath sounds.  ?Abdominal:  ?   General: Bowel sounds are normal.  ?Musculoskeletal:  ?   Cervical back: Normal range of motion.  ?   Comments: Walks with antalgic gait. Straight leg raise on the right is buttock thigh pain. Quadricep 5-/5 as is his hip flexor on the right  ?Skin: ?   General: Skin is warm and dry.  ?Neurological:  ?   Mental Status: He is alert.  ?  ?Recent MRI demonstrates multifactorial severe stenosis at L3-4 with a paracentral disc herniation L3-4 to the right ? ?Assessment/Plan ?Impression: ? 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 ?Recent multiple surgeries patient has recovered from ? ?Plan: ? ?We discussed lumbar decompression at L3-4 ? ?I had an extensive discussion with the patient concerning the pathology relevant anatomy and treatment options. At this point exhausting conservative treatment and in the presence of a neurologic deficit we discussed microlumbar decompression. I discussed the risks and benefits including bleeding, infection, DVT, PE, anesthetic complications, worsening in their symptoms, improvement in their symptoms, C SF leakage, epidural fibrosis, need for future surgeries such as revision  discectomy and lumbar fusion. I also indicated that this is an operation to basically decompress the nerve roots to allow recovery as opposed to fixing a herniated disc if it is encountered and that the incidence of recurrent chest disc herniation can approach 15%. Also that nerve root recovery is variable and may not recover completely. Any ligament or bone that is contributing to compressing the nerves will be removed as well. ? ?I discussed the operative course including overnight in the hospital. Immediate ambulation. Follow-up in 2 weeks for suture removal. 6 weeks until healing of the herniation and surgical incision followed by 6 weeks of reconditioning and strengthening of the core musculature. Also discussed the need to employ the concepts of disc pressure management and core motion following the surgery to minimize the risk of recurrent disc herniation. We will obtain preoperative clearance i if necessary and proceed accordingly. ? ?Continue as needed analgesics ? ?Plan microlumbar decompression L3-4 ? ?Cecilie Kicks, PA-C for Dr Tonita Cong ?12/28/2021, 9:49 AM ? ? ? ?

## 2021-12-28 NOTE — H&P (View-Only) (Signed)
Aaron Nichols is an 51 y.o. male.   ?Chief Complaint: back and leg pain ?HPI: Reason for Visit: low back ?Context: The patient is 1 year, 8 months out ?Location (Lower Extremity): lower back pain ; leg pain on the right, , ?Severity: pain level 4/10 ?Timing: come and go ?Aggravating Factors: sitting for ?Associated Symptoms: numbness/tingling ?Medications: Percocet 5/325 ? ?Past Medical History:  ?Diagnosis Date  ? Abnormal LFTs (liver function tests)   ? Arthritis   ? BP (high blood pressure) 01/26/2015  ? 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 Complication of anesthesia 2008  ? brain tumor 15 hours surgery resulting nausea and vomiting for severeal days  ? Complication of anesthesia   ? pt. woke up before extubation with cystoscopy  ? GERD (gastroesophageal reflux disease)   ? Hydronephrosis with urinary obstruction due to ureteral calculus 07/03/2015  ? Hypertension   ? Infection of the upper respiratory tract 04/22/2015  ? Kidney stone 1990  ? PONV (postoperative nausea and vomiting)   ? Right ureteral stone 07/03/2015  ? Right-sided Bell's palsy 2008  ? nerve fatigue  ? ? ?Past Surgical History:  ?Procedure Laterality Date  ? BRAIN TUMOR EXCISION    ? CYSTOSCOPY W/ RETROGRADES Left 09/07/2015  ? Procedure: CYSTOSCOPY WITH RETROGRADE PYELOGRAM;  Surgeon: Nickie Retort, MD;  Location: [facility];  Service: Urology;  Laterality: Left;  ? CYSTOSCOPY W/ RETROGRADES Left 10/03/2015  ? Procedure: CYSTOSCOPY WITH RETROGRADE PYELOGRAM;  Surgeon: Nickie Retort, MD;  Location: [facility];  Service: Urology;  Laterality: Left;  ? CYSTOSCOPY WITH STENT PLACEMENT Right 07/06/2015  ? Procedure: CYSTOSCOPY WITH STENT PLACEMENT;  Surgeon: Collier Flowers, MD;  Location: [facility];  Service: Urology;  Laterality: Right;  ? CYSTOSCOPY WITH STENT PLACEMENT Left 09/07/2015  ? Procedure: CYSTOSCOPY WITH STENT PLACEMENT;  Surgeon: Nickie Retort, MD;  Location: [facility];  Service: Urology;  Laterality: Left;  ? CYSTOSCOPY WITH  STENT PLACEMENT Left 10/03/2015  ? Procedure: CYSTOSCOPY WITH STENT PLACEMENT/ EXCHANGE/STONE BASKETING;  Surgeon: Nickie Retort, MD;  Location: [facility];  Service: Urology;  Laterality: Left;  ? ELBOW FRACTURE SURGERY Left 1998  ? FRACTURE SURGERY    ? URETEROSCOPY WITH HOLMIUM LASER LITHOTRIPSY Right 07/06/2015  ? Procedure: URETEROSCOPY ;  Surgeon: Collier Flowers, MD;  Location: [facility];  Service: Urology;  Laterality: Right;  ? URETEROSCOPY WITH HOLMIUM LASER LITHOTRIPSY Right 07/25/2015  ? Procedure: URETEROSCOPY WITH HOLMIUM LASER LITHOTRIPSY, CYSTOSCOPY,  STENT REMOVAL ;  Surgeon: Collier Flowers, MD;  Location: [facility];  Service: Urology;  Laterality: Right;  ? URETEROSCOPY WITH HOLMIUM LASER LITHOTRIPSY Left 09/07/2015  ? Procedure: URETEROSCOPY;  Surgeon: Nickie Retort, MD;  Location: [facility];  Service: Urology;  Laterality: Left;  ? URETEROSCOPY WITH HOLMIUM LASER LITHOTRIPSY Left 10/03/2015  ? Procedure: URETEROSCOPY WITH HOLMIUM LASER LITHOTRIPSY;  Surgeon: Nickie Retort, MD;  Location: [facility];  Service: Urology;  Laterality: Left;  ? ? ?Family History  ?Problem Relation Age of Onset  ? Heart disease Father   ? ?Social History:  reports that he has been smoking cigarettes. He has been smoking an average of 1.5 packs per day. He has never used smokeless tobacco. He reports that he does not drink alcohol and does not use drugs. ? ?Allergies:  ?Allergies  ?Allergen Reactions  ? Ceftin [Cefuroxime Axetil]   ?  Dizziness,lightheaded  ? ?Current meds: ?Aller-Tec 10 mg tablet ?amLODIPine 5 mg tablet ?atorvastatin 20 mg  tablet ?famotidine ?lisinopriL 20 mg tablet ?oxyCODONE-acetaminophen 5 mg-325 mg tablet ? ?Review of Systems  ?Constitutional: Negative.   ?HENT: Negative.    ?Eyes: Negative.   ?Respiratory: Negative.    ?Cardiovascular: Negative.   ?Gastrointestinal: Negative.   ?Endocrine: Negative.   ?Genitourinary: Negative.   ?Musculoskeletal:  Positive for back pain and gait problem.   ?Skin: Negative.   ?Neurological:  Positive for weakness and numbness.  ?Psychiatric/Behavioral: Negative.    ? ?There were no vitals taken for this visit. ?Physical Exam ?Constitutional:   ?   Appearance: Normal appearance.  ?HENT:  ?   Head: Normocephalic and atraumatic.  ?   Right Ear: External ear normal.  ?   Left Ear: External ear normal.  ?   Nose: Nose normal.  ?   Mouth/Throat:  ?   Pharynx: Oropharynx is clear.  ?Eyes:  ?   Conjunctiva/sclera: Conjunctivae normal.  ?Cardiovascular:  ?   Rate and Rhythm: Normal rate and regular rhythm.  ?   Pulses: Normal pulses.  ?Pulmonary:  ?   Effort: Pulmonary effort is normal.  ?   Breath sounds: Normal breath sounds.  ?Abdominal:  ?   General: Bowel sounds are normal.  ?Musculoskeletal:  ?   Cervical back: Normal range of motion.  ?   Comments: Walks with antalgic gait. Straight leg raise on the right is buttock thigh pain. Quadricep 5-/5 as is his hip flexor on the right  ?Skin: ?   General: Skin is warm and dry.  ?Neurological:  ?   Mental Status: He is alert.  ?  ?Recent MRI demonstrates multifactorial severe stenosis at L3-4 with a paracentral disc herniation L3-4 to the right ? ?Assessment/Plan ?Impression: ? 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 ?Recent multiple surgeries patient has recovered from ? ?Plan: ? ?We discussed lumbar decompression at L3-4 ? ?I had an extensive discussion with the patient concerning the pathology relevant anatomy and treatment options. At this point exhausting conservative treatment and in the presence of a neurologic deficit we discussed microlumbar decompression. I discussed the risks and benefits including bleeding, infection, DVT, PE, anesthetic complications, worsening in their symptoms, improvement in their symptoms, C SF leakage, epidural fibrosis, need for future surgeries such as revision  discectomy and lumbar fusion. I also indicated that this is an operation to basically decompress the nerve roots to allow recovery as opposed to fixing a herniated disc if it is encountered and that the incidence of recurrent chest disc herniation can approach 15%. Also that nerve root recovery is variable and may not recover completely. Any ligament or bone that is contributing to compressing the nerves will be removed as well. ? ?I discussed the operative course including overnight in the hospital. Immediate ambulation. Follow-up in 2 weeks for suture removal. 6 weeks until healing of the herniation and surgical incision followed by 6 weeks of reconditioning and strengthening of the core musculature. Also discussed the need to employ the concepts of disc pressure management and core motion following the surgery to minimize the risk of recurrent disc herniation. We will obtain preoperative clearance i if necessary and proceed accordingly. ? ?Continue as needed analgesics ? ?Plan microlumbar decompression L3-4 ? ?Cecilie Kicks, PA-C for Dr Tonita Cong ?12/28/2021, 9:49 AM ? ? ? ?

## 2021-12-29 NOTE — Progress Notes (Signed)
Surgical Instructions ? ? ? Your procedure is scheduled on Thursday, March 23rd, 2023. ? ? Report to [facility] Main Entrance "A" at 05:30 A.M., then check in with the Admitting office. ? Call this number if you have problems the morning of surgery: ? [phone] ? ? If you have any questions prior to your surgery date call [phone]: Open Monday-Friday 8am-4pm ? ? ? Remember: ? Do not eat after midnight the night before your surgery ? ?You may drink clear liquids until 04:30 the morning of your surgery.   ?Clear liquids allowed are: Water, Non-Citrus Juices (without pulp), Carbonated Beverages, Clear Tea, Black Coffee ONLY (NO MILK, CREAM OR POWDERED CREAMER of any kind), and Gatorade ? ?Patient Instructions ? ?The night before surgery:  ?No food after midnight. ONLY clear liquids after midnight ? ?The day of surgery (if you do NOT have diabetes):  ?Drink ONE (1) Pre-Surgery Clear Ensure by 04:30 the morning of surgery. Drink in one sitting. Do not sip.  ?This drink was given to you during your hospital  ?pre-op appointment visit. ? ?Nothing else to drink after completing the  ?Pre-Surgery Clear Ensure. ? ? ?       If you have questions, please contact your surgeon?s office.  ?  ? Take these medicines the morning of surgery with A SIP OF WATER:  ? ?amLODipine (NORVASC) ?atorvastatin (LIPITOR) ?famotidine (PEPCID) ?oxyCODONE-acetaminophen (PERCOCET/ROXICET) - as needed ? ?As of today, STOP taking any Aspirin (unless otherwise instructed by your surgeon) Aleve, Naproxen, Ibuprofen, Motrin, Advil, Goody's, BC's, all herbal medications, fish oil, and all vitamins. ? ? ? The day of surgery: ?         ?Do not wear jewelry  ?Do not wear lotions, powders, colognes, or deodorant. ?Men may shave face and neck. ?Do not bring valuables to the hospital. ? ? ?[facility] is not responsible for any belongings or valuables. .  ? ?Do NOT Smoke (Tobacco/Vaping)  24 hours prior to your procedure ? ?If you use a CPAP at night, you  may bring your mask for your overnight stay. ?  ?Contacts, glasses, hearing aids, dentures or partials may not be worn into surgery, please bring cases for these belongings ?  ?For patients admitted to the hospital, discharge time will be determined by your treatment team. ?  ?Patients discharged the day of surgery will not be allowed to drive home, and someone needs to stay with them for 24 hours. ? ?NO VISITORS WILL BE ALLOWED IN PRE-OP WHERE PATIENTS ARE PREPPED FOR SURGERY.  ONLY 1 SUPPORT PERSON MAY BE PRESENT IN THE WAITING ROOM WHILE YOU ARE IN SURGERY.  IF YOU ARE TO BE ADMITTED, ONCE YOU ARE IN YOUR ROOM YOU WILL BE ALLOWED TWO (2) VISITORS. 1 (ONE) VISITOR MAY STAY OVERNIGHT BUT MUST ARRIVE TO THE ROOM BY 8pm.  Minor children may have two parents present. Special consideration for safety and communication needs will be reviewed on a case by case basis. ? ?Special instructions:   ? ?Oral Hygiene is also important to reduce your risk of infection.  Remember - BRUSH YOUR TEETH THE MORNING OF SURGERY WITH YOUR REGULAR TOOTHPASTE ? ? ?[facility]- Preparing For Surgery ? ?Before surgery, you can play an important role. Because skin is not sterile, your skin needs to be as free of germs as possible. You can reduce the number of germs on your skin by washing with CHG (chlorahexidine gluconate) Soap before surgery.  CHG is an antiseptic cleaner which kills  germs and bonds with the skin to continue killing germs even after washing.   ? ? ?Please do not use if you have an allergy to CHG or antibacterial soaps. If your skin becomes reddened/irritated stop using the CHG.  ?Do not shave (including legs and underarms) for at least 48 hours prior to first CHG shower. It is OK to shave your face. ? ?Please follow these instructions carefully. ?  ? ? Shower the NIGHT BEFORE SURGERY and the MORNING OF SURGERY with CHG Soap.  ? If you chose to wash your hair, wash your hair first as usual with your normal shampoo. After you  shampoo, rinse your hair and body thoroughly to remove the shampoo.  Then [employer] and genitals (private parts) with your normal soap and rinse thoroughly to remove soap. ? ?After that Use CHG Soap as you would any other liquid soap. You can apply CHG directly to the skin and wash gently with a scrungie or a clean washcloth.  ? ?Apply the CHG Soap to your body ONLY FROM THE NECK DOWN.  Do not use on open wounds or open sores. Avoid contact with your eyes, ears, mouth and genitals (private parts). Wash Face and genitals (private parts)  with your normal soap.  ? ?Wash thoroughly, paying special attention to the area where your surgery will be performed. ? ?Thoroughly rinse your body with warm water from the neck down. ? ?DO NOT shower/wash with your normal soap after using and rinsing off the CHG Soap. ? ?Pat yourself dry with a CLEAN TOWEL. ? ?Wear CLEAN PAJAMAS to bed the night before surgery ? ?Place CLEAN SHEETS on your bed the night before your surgery ? ?DO NOT SLEEP WITH PETS. ? ? ?Day of Surgery: ? ?Take a shower with CHG soap. ?Wear Clean/Comfortable clothing the morning of surgery ?Do not apply any deodorants/lotions.   ?Remember to brush your teeth WITH YOUR REGULAR TOOTHPASTE. ? ?  ?Please read over the following fact sheets that you were given.   ?

## 2022-01-01 ENCOUNTER — Encounter (HOSPITAL_COMMUNITY)
Admission: RE | Admit: 2022-01-01 | Discharge: 2022-01-01 | Disposition: A | Discharge status: Home / Self Care | Payer: BC Managed Care – PPO | Source: Ambulatory Visit | Attending: Specialist | Admitting: Specialist

## 2022-01-01 ENCOUNTER — Other Ambulatory Visit: Payer: Self-pay

## 2022-01-01 ENCOUNTER — Encounter (HOSPITAL_COMMUNITY): Payer: Self-pay

## 2022-01-01 ENCOUNTER — Ambulatory Visit (HOSPITAL_COMMUNITY)
Admission: RE | Admit: 2022-01-01 | Discharge: 2022-01-01 | Disposition: A | Discharge status: Home / Self Care | Payer: BC Managed Care – PPO | Source: Ambulatory Visit | Attending: Orthopedic Surgery | Admitting: Orthopedic Surgery

## 2022-01-01 VITALS — BP 148/88 | HR 70 | Temp 98.0°F | Resp 18 | Ht 70.0 in | Wt 227.5 lb

## 2022-01-01 DIAGNOSIS — M48062 Spinal stenosis, lumbar region with neurogenic claudication: Secondary | ICD-10-CM | POA: Diagnosis present

## 2022-01-01 DIAGNOSIS — Z01818 Encounter for other preprocedural examination: Secondary | ICD-10-CM | POA: Insufficient documentation

## 2022-01-01 DIAGNOSIS — M5126 Other intervertebral disc displacement, lumbar region: Secondary | ICD-10-CM | POA: Insufficient documentation

## 2022-01-01 LAB — CBC
HCT: 46.1 % (ref 39.0–52.0)
Hemoglobin: 15.3 g/dL (ref 13.0–17.0)
MCH: 30.7 pg (ref 26.0–34.0)
MCHC: 33.2 g/dL (ref 30.0–36.0)
MCV: 92.4 fL (ref 80.0–100.0)
Platelets: 280 10*3/uL (ref 150–400)
RBC: 4.99 MIL/uL (ref 4.22–5.81)
RDW: 13.5 % (ref 11.5–15.5)
WBC: 9.4 10*3/uL (ref 4.0–10.5)
nRBC: 0 % (ref 0.0–0.2)

## 2022-01-01 LAB — BASIC METABOLIC PANEL
Anion gap: 8 (ref 5–15)
BUN: 15 mg/dL (ref 6–20)
CO2: 24 mmol/L (ref 22–32)
Calcium: 9.4 mg/dL (ref 8.9–10.3)
Chloride: 107 mmol/L (ref 98–111)
Creatinine, Ser: 0.85 mg/dL (ref 0.61–1.24)
GFR, Estimated: >60 mL/min (ref >60)
Glucose, Bld: 104 mg/dL — ABNORMAL HIGH (ref 70–99)
Potassium: 4.2 mmol/L (ref 3.5–5.1)
Sodium: 139 mmol/L (ref 135–145)

## 2022-01-01 LAB — SURGICAL PCR SCREEN
MRSA, PCR: NEGATIVE
Staphylococcus aureus: NEGATIVE

## 2022-01-01 NOTE — Progress Notes (Signed)
PCP - Dr. Thereasa Distance ?Cardiologist - denies ? ?PPM/ICD - denies ? ? ?Chest x-ray - 12/08/20 ?EKG - 01/01/22 ?Stress Test - 15 yrs ago per pt, no abnormalities ?ECHO - denies ?Cardiac Cath - denies ? ?Sleep Study - denies ? ?DM- denies ? ? ?ASA/Blood Thinner Instructions: n/a ? ? ?ERAS Protcol - yes ?PRE-SURGERY Ensure given at PAT ? ?COVID TEST- n/a, ambulatory surgery ? ? ?Anesthesia review: no ? ?Patient denies shortness of breath, fever, cough and chest pain at PAT appointment ? ? ?All instructions explained to the patient, with a verbal understanding of the material. Patient agrees to go over the instructions while at home for a better understanding. Patient also instructed to wear a mask in public for 3 days prior to surgery. The opportunity to ask questions was provided. ?  ?

## 2022-01-11 ENCOUNTER — Encounter (HOSPITAL_COMMUNITY)
Admission: RE | Disposition: A | Discharge status: Home / Self Care | Payer: Self-pay | Source: Ambulatory Visit | Attending: Specialist

## 2022-01-11 ENCOUNTER — Ambulatory Visit (HOSPITAL_COMMUNITY)
Admission: RE | Admit: 2022-01-11 | Discharge: 2022-01-11 | Disposition: A | Discharge status: Home / Self Care | Payer: BC Managed Care – PPO | Source: Ambulatory Visit | Attending: Specialist | Admitting: Specialist

## 2022-01-11 ENCOUNTER — Ambulatory Visit (HOSPITAL_COMMUNITY): Payer: BC Managed Care – PPO | Admitting: Anesthesiology

## 2022-01-11 ENCOUNTER — Other Ambulatory Visit: Payer: Self-pay

## 2022-01-11 ENCOUNTER — Ambulatory Visit (HOSPITAL_COMMUNITY): Payer: BC Managed Care – PPO

## 2022-01-11 ENCOUNTER — Encounter (HOSPITAL_COMMUNITY): Payer: Self-pay | Admitting: Specialist

## 2022-01-11 DIAGNOSIS — K219 Gastro-esophageal reflux disease without esophagitis: Secondary | ICD-10-CM | POA: Insufficient documentation

## 2022-01-11 DIAGNOSIS — I10 Essential (primary) hypertension: Secondary | ICD-10-CM | POA: Insufficient documentation

## 2022-01-11 DIAGNOSIS — N289 Disorder of kidney and ureter, unspecified: Secondary | ICD-10-CM | POA: Diagnosis not present

## 2022-01-11 DIAGNOSIS — M48061 Spinal stenosis, lumbar region without neurogenic claudication: Secondary | ICD-10-CM | POA: Insufficient documentation

## 2022-01-11 DIAGNOSIS — M5116 Intervertebral disc disorders with radiculopathy, lumbar region: Secondary | ICD-10-CM | POA: Insufficient documentation

## 2022-01-11 DIAGNOSIS — F17210 Nicotine dependence, cigarettes, uncomplicated: Secondary | ICD-10-CM | POA: Insufficient documentation

## 2022-01-11 HISTORY — PX: LUMBAR LAMINECTOMY/DECOMPRESSION MICRODISCECTOMY: SHX5026

## 2022-01-11 SURGERY — LUMBAR LAMINECTOMY/DECOMPRESSION MICRODISCECTOMY 1 LEVEL
Anesthesia: General

## 2022-01-11 MED ORDER — BISACODYL 5 MG PO TBEC: 5.0000 mg | DELAYED_RELEASE_TABLET | Freq: Every day | ORAL | Status: DC | PRN

## 2022-01-11 MED ORDER — LACTATED RINGERS IV SOLN: INTRAVENOUS | Status: DC

## 2022-01-11 MED ORDER — ALUM & MAG HYDROXIDE-SIMETH 200-200-20 MG/5ML PO SUSP: 30.0000 mL | Freq: Four times a day (QID) | ORAL | Status: DC | PRN

## 2022-01-11 MED ORDER — TRANEXAMIC ACID-NACL 1000-0.7 MG/100ML-% IV SOLN
INTRAVENOUS | Status: DC | PRN
  Administered 2022-01-11: 1000 mg via INTRAVENOUS

## 2022-01-11 MED ORDER — HYDROMORPHONE HCL 1 MG/ML IJ SOLN
1.0000 mg | INTRAMUSCULAR | Status: DC | PRN
  Administered 2022-01-11: 1 mg via INTRAVENOUS
  Filled 2022-01-11: qty 1

## 2022-01-11 MED ORDER — GABAPENTIN 300 MG PO CAPS
300.0000 mg | ORAL_CAPSULE | Freq: Once | ORAL | Status: AC
  Administered 2022-01-11: 300 mg via ORAL
  Filled 2022-01-11: qty 1

## 2022-01-11 MED ORDER — AMLODIPINE BESYLATE 5 MG PO TABS
5.0000 mg | ORAL_TABLET | Freq: Every day | ORAL | Status: DC
  Administered 2022-01-11: 5 mg via ORAL
  Filled 2022-01-11: qty 1

## 2022-01-11 MED ORDER — METHOCARBAMOL 500 MG PO TABS: 500.0000 mg | ORAL_TABLET | Freq: Three times a day (TID) | ORAL | 1 refills | Status: DC | PRN

## 2022-01-11 MED ORDER — ORAL CARE MOUTH RINSE: 15.0000 mL | Freq: Once | OROMUCOSAL | Status: AC

## 2022-01-11 MED ORDER — DIPHENHYDRAMINE HCL 50 MG/ML IJ SOLN
INTRAMUSCULAR | Status: DC | PRN
  Administered 2022-01-11: 12.5 mg via INTRAVENOUS

## 2022-01-11 MED ORDER — MENTHOL 3 MG MT LOZG: 1.0000 | LOZENGE | OROMUCOSAL | Status: DC | PRN

## 2022-01-11 MED ORDER — ACETAMINOPHEN 650 MG RE SUPP: 650.0000 mg | RECTAL | Status: DC | PRN

## 2022-01-11 MED ORDER — FENTANYL CITRATE (PF) 100 MCG/2ML IJ SOLN
25.0000 ug | INTRAMUSCULAR | Status: DC | PRN
  Administered 2022-01-11 (×3): 50 ug via INTRAVENOUS

## 2022-01-11 MED ORDER — ACETAMINOPHEN 325 MG PO TABS: 650.0000 mg | ORAL_TABLET | ORAL | Status: DC | PRN

## 2022-01-11 MED ORDER — ONDANSETRON HCL 4 MG/2ML IJ SOLN: 4.0000 mg | Freq: Four times a day (QID) | INTRAMUSCULAR | Status: DC | PRN

## 2022-01-11 MED ORDER — KCL IN DEXTROSE-NACL 20-5-0.45 MEQ/L-%-% IV SOLN: INTRAVENOUS | Status: DC

## 2022-01-11 MED ORDER — ONDANSETRON HCL 4 MG PO TABS: 4.0000 mg | ORAL_TABLET | Freq: Four times a day (QID) | ORAL | Status: DC | PRN

## 2022-01-11 MED ORDER — SUGAMMADEX SODIUM 200 MG/2ML IV SOLN
INTRAVENOUS | Status: DC | PRN
  Administered 2022-01-11: 200 mg via INTRAVENOUS

## 2022-01-11 MED ORDER — LIDOCAINE 2% (20 MG/ML) 5 ML SYRINGE
INTRAMUSCULAR | Status: DC | PRN
  Administered 2022-01-11: 80 mg via INTRAVENOUS

## 2022-01-11 MED ORDER — FENTANYL CITRATE (PF) 100 MCG/2ML IJ SOLN
INTRAMUSCULAR | Status: AC
  Filled 2022-01-11: qty 2

## 2022-01-11 MED ORDER — DOCUSATE SODIUM 100 MG PO CAPS: 100.0000 mg | ORAL_CAPSULE | Freq: Two times a day (BID) | ORAL | 1 refills | Status: DC | PRN

## 2022-01-11 MED ORDER — AMISULPRIDE (ANTIEMETIC) 5 MG/2ML IV SOLN: 10.0000 mg | Freq: Once | INTRAVENOUS | Status: DC | PRN

## 2022-01-11 MED ORDER — ACETAMINOPHEN 10 MG/ML IV SOLN: 1000.0000 mg | INTRAVENOUS | Status: DC

## 2022-01-11 MED ORDER — PROPOFOL 10 MG/ML IV BOLUS
INTRAVENOUS | Status: AC
  Filled 2022-01-11: qty 20

## 2022-01-11 MED ORDER — GENTAMICIN SULFATE 40 MG/ML IJ SOLN
160.0000 mg | INTRAVENOUS | Status: AC
  Administered 2022-01-11: 160 mg via INTRAVENOUS
  Filled 2022-01-11 (×2): qty 4

## 2022-01-11 MED ORDER — PROPOFOL 10 MG/ML IV BOLUS
INTRAVENOUS | Status: DC | PRN
  Administered 2022-01-11: 150 mg via INTRAVENOUS

## 2022-01-11 MED ORDER — FENTANYL CITRATE (PF) 250 MCG/5ML IJ SOLN
INTRAMUSCULAR | Status: AC
  Filled 2022-01-11: qty 5

## 2022-01-11 MED ORDER — DOCUSATE SODIUM 100 MG PO CAPS
100.0000 mg | ORAL_CAPSULE | Freq: Two times a day (BID) | ORAL | Status: DC
  Administered 2022-01-11: 100 mg via ORAL
  Filled 2022-01-11: qty 1

## 2022-01-11 MED ORDER — BUPIVACAINE-EPINEPHRINE 0.5% -1:200000 IJ SOLN
INTRAMUSCULAR | Status: AC
  Filled 2022-01-11: qty 1

## 2022-01-11 MED ORDER — MIDAZOLAM HCL 5 MG/5ML IJ SOLN
INTRAMUSCULAR | Status: DC | PRN
  Administered 2022-01-11: 2 mg via INTRAVENOUS

## 2022-01-11 MED ORDER — PHENOL 1.4 % MT LIQD: 1.0000 | OROMUCOSAL | Status: DC | PRN

## 2022-01-11 MED ORDER — OXYCODONE HCL 5 MG PO TABS
10.0000 mg | ORAL_TABLET | ORAL | Status: DC | PRN
  Administered 2022-01-11: 10 mg via ORAL
  Filled 2022-01-11: qty 2

## 2022-01-11 MED ORDER — ONDANSETRON HCL 4 MG/2ML IJ SOLN
INTRAMUSCULAR | Status: DC | PRN
Start: 2022-01-11 — End: 2022-01-11
  Administered 2022-01-11: 4 mg via INTRAVENOUS

## 2022-01-11 MED ORDER — METHOCARBAMOL 1000 MG/10ML IJ SOLN
500.0000 mg | Freq: Four times a day (QID) | INTRAVENOUS | Status: DC | PRN
  Filled 2022-01-11: qty 5

## 2022-01-11 MED ORDER — DIPHENHYDRAMINE HCL 50 MG/ML IJ SOLN
INTRAMUSCULAR | Status: AC
  Filled 2022-01-11: qty 1

## 2022-01-11 MED ORDER — MIDAZOLAM HCL 2 MG/2ML IJ SOLN
INTRAMUSCULAR | Status: AC
  Filled 2022-01-11: qty 2

## 2022-01-11 MED ORDER — VANCOMYCIN HCL 1500 MG/300ML IV SOLN
1500.0000 mg | INTRAVENOUS | Status: AC
  Administered 2022-01-11: 1500 mg via INTRAVENOUS
  Filled 2022-01-11: qty 300

## 2022-01-11 MED ORDER — CHLORHEXIDINE GLUCONATE 0.12 % MT SOLN
15.0000 mL | Freq: Once | OROMUCOSAL | Status: AC
  Administered 2022-01-11: 15 mL via OROMUCOSAL
  Filled 2022-01-11: qty 15

## 2022-01-11 MED ORDER — POLYETHYLENE GLYCOL 3350 17 G PO PACK: 17.0000 g | PACK | Freq: Every day | ORAL | 0 refills | Status: AC

## 2022-01-11 MED ORDER — ONDANSETRON HCL 4 MG/2ML IJ SOLN
INTRAMUSCULAR | Status: AC
  Filled 2022-01-11: qty 2

## 2022-01-11 MED ORDER — 0.9 % SODIUM CHLORIDE (POUR BTL) OPTIME
TOPICAL | Status: DC | PRN
  Administered 2022-01-11: 1000 mL

## 2022-01-11 MED ORDER — POLYETHYLENE GLYCOL 3350 17 G PO PACK: 17.0000 g | PACK | Freq: Every day | ORAL | Status: DC

## 2022-01-11 MED ORDER — FENTANYL CITRATE (PF) 100 MCG/2ML IJ SOLN
25.0000 ug | INTRAMUSCULAR | Status: DC | PRN
  Administered 2022-01-11 (×2): 25 ug via INTRAVENOUS
  Administered 2022-01-11: 50 ug via INTRAVENOUS
  Administered 2022-01-11 (×2): 25 ug via INTRAVENOUS

## 2022-01-11 MED ORDER — METHOCARBAMOL 500 MG PO TABS
500.0000 mg | ORAL_TABLET | Freq: Four times a day (QID) | ORAL | Status: DC | PRN
  Administered 2022-01-11: 500 mg via ORAL
  Filled 2022-01-11: qty 1

## 2022-01-11 MED ORDER — DEXAMETHASONE SODIUM PHOSPHATE 10 MG/ML IJ SOLN
INTRAMUSCULAR | Status: DC | PRN
  Administered 2022-01-11: 10 mg via INTRAVENOUS

## 2022-01-11 MED ORDER — ACETAMINOPHEN 500 MG PO TABS
1000.0000 mg | ORAL_TABLET | Freq: Once | ORAL | Status: AC
  Administered 2022-01-11: 1000 mg via ORAL
  Filled 2022-01-11: qty 2

## 2022-01-11 MED ORDER — RISAQUAD PO CAPS
1.0000 | ORAL_CAPSULE | Freq: Every day | ORAL | Status: DC
Start: 2022-01-11 — End: 2022-01-12
  Administered 2022-01-11: 1 via ORAL
  Filled 2022-01-11: qty 1

## 2022-01-11 MED ORDER — THROMBIN 20000 UNITS EX SOLR
CUTANEOUS | Status: DC | PRN
  Administered 2022-01-11: 20 mL via TOPICAL

## 2022-01-11 MED ORDER — ROCURONIUM BROMIDE 100 MG/10ML IV SOLN
INTRAVENOUS | Status: DC | PRN
  Administered 2022-01-11: 80 mg via INTRAVENOUS

## 2022-01-11 MED ORDER — THROMBIN 20000 UNITS EX SOLR
CUTANEOUS | Status: AC
  Filled 2022-01-11: qty 20000

## 2022-01-11 MED ORDER — TRANEXAMIC ACID-NACL 1000-0.7 MG/100ML-% IV SOLN
INTRAVENOUS | Status: AC
  Filled 2022-01-11: qty 100

## 2022-01-11 MED ORDER — GLYCOPYRROLATE 0.2 MG/ML IJ SOLN
INTRAMUSCULAR | Status: DC | PRN
  Administered 2022-01-11 (×2): .1 mg via INTRAVENOUS

## 2022-01-11 MED ORDER — OXYCODONE HCL 10 MG PO TABS: 10.0000 mg | ORAL_TABLET | ORAL | 0 refills | Status: AC | PRN

## 2022-01-11 MED ORDER — FENTANYL CITRATE (PF) 100 MCG/2ML IJ SOLN
INTRAMUSCULAR | Status: DC | PRN
  Administered 2022-01-11 (×4): 50 ug via INTRAVENOUS

## 2022-01-11 MED ORDER — OXYCODONE HCL 5 MG PO TABS: 5.0000 mg | ORAL_TABLET | ORAL | Status: DC | PRN

## 2022-01-11 MED ORDER — GLYCOPYRROLATE PF 0.2 MG/ML IJ SOSY
PREFILLED_SYRINGE | INTRAMUSCULAR | Status: AC
  Filled 2022-01-11: qty 1

## 2022-01-11 MED ORDER — CEFAZOLIN SODIUM-DEXTROSE 2-4 GM/100ML-% IV SOLN
2.0000 g | Freq: Once | INTRAVENOUS | Status: AC
  Administered 2022-01-11: 2 g via INTRAVENOUS
  Filled 2022-01-11: qty 100

## 2022-01-11 MED ORDER — LISINOPRIL 20 MG PO TABS: 20.0000 mg | ORAL_TABLET | Freq: Every day | ORAL | Status: DC

## 2022-01-11 MED ORDER — BUPIVACAINE-EPINEPHRINE 0.5% -1:200000 IJ SOLN
INTRAMUSCULAR | Status: DC | PRN
  Administered 2022-01-11: 7 mL

## 2022-01-11 MED ORDER — FAMOTIDINE 20 MG PO TABS
20.0000 mg | ORAL_TABLET | Freq: Every day | ORAL | Status: DC
  Administered 2022-01-11: 20 mg via ORAL
  Filled 2022-01-11: qty 1

## 2022-01-11 SURGICAL SUPPLY — 62 items
BAG COUNTER SPONGE SURGICOUNT (BAG) ×2 IMPLANT
BAG DECANTER FOR FLEXI CONT (MISCELLANEOUS) IMPLANT
BAND RUBBER #18 3X1/16 STRL (MISCELLANEOUS) ×4 IMPLANT
BUR EGG ELITE 5.0 (BURR) IMPLANT
BUR RND DIAMOND ELITE 4.0 (BURR) IMPLANT
BUR STRYKR EGG 5.0 (BURR) IMPLANT
CARTRIDGE OIL MAESTRO DRILL (MISCELLANEOUS) IMPLANT
CLEANER TIP ELECTROSURG 2X2 (MISCELLANEOUS) ×2 IMPLANT
CNTNR URN SCR LID CUP LEK RST (MISCELLANEOUS) ×1 IMPLANT
CONT SPEC 4OZ STRL OR WHT (MISCELLANEOUS) ×1
DIFFUSER DRILL AIR PNEUMATIC (MISCELLANEOUS) IMPLANT
DRAPE LAPAROTOMY 100X72X124 (DRAPES) ×2 IMPLANT
DRAPE MICROSCOPE LEICA (MISCELLANEOUS) ×2 IMPLANT
DRAPE SHEET LG 3/4 BI-LAMINATE (DRAPES) ×2 IMPLANT
DRAPE SURG 17X11 SM STRL (DRAPES) ×2 IMPLANT
DRAPE UTILITY XL STRL (DRAPES) ×2 IMPLANT
DRSG AQUACEL AG ADV 3.5X 4 (GAUZE/BANDAGES/DRESSINGS) IMPLANT
DRSG AQUACEL AG ADV 3.5X 6 (GAUZE/BANDAGES/DRESSINGS) ×2 IMPLANT
DRSG TELFA 3X8 NADH (GAUZE/BANDAGES/DRESSINGS) IMPLANT
DURAPREP 26ML APPLICATOR (WOUND CARE) ×2 IMPLANT
DURASEAL SPINE SEALANT 3ML (MISCELLANEOUS) IMPLANT
ELECT BLADE 4.0 EZ CLEAN MEGAD (MISCELLANEOUS)
ELECT REM PT RETURN 9FT ADLT (ELECTROSURGICAL) ×2
ELECTRODE BLDE 4.0 EZ CLN MEGD (MISCELLANEOUS) IMPLANT
ELECTRODE REM PT RTRN 9FT ADLT (ELECTROSURGICAL) ×1 IMPLANT
GLOVE SURG POLYISO LF SZ7.5 (GLOVE) ×4 IMPLANT
GLOVE SURG POLYISO LF SZ8 (GLOVE) ×4 IMPLANT
GLOVE SURG UNDER POLY LF SZ7 (GLOVE) ×2 IMPLANT
GLOVE SURG UNDER POLY LF SZ7.5 (GLOVE) ×3 IMPLANT
GOWN STRL REUS W/ TWL LRG LVL3 (GOWN DISPOSABLE) ×1 IMPLANT
GOWN STRL REUS W/ TWL XL LVL3 (GOWN DISPOSABLE) ×1 IMPLANT
GOWN STRL REUS W/TWL LRG LVL3 (GOWN DISPOSABLE) ×1
GOWN STRL REUS W/TWL XL LVL3 (GOWN DISPOSABLE) ×1
GRAFT DURAGEN MATRIX 1WX1L (Tissue) ×1 IMPLANT
IV CATH 14GX2 1/4 (CATHETERS) ×2 IMPLANT
KIT BASIN OR (CUSTOM PROCEDURE TRAY) ×2 IMPLANT
NDL SPNL 18GX3.5 QUINCKE PK (NEEDLE) ×2 IMPLANT
NEEDLE 22X1 1/2 (OR ONLY) (NEEDLE) ×2 IMPLANT
NEEDLE SPNL 18GX3.5 QUINCKE PK (NEEDLE) ×4 IMPLANT
OIL CARTRIDGE MAESTRO DRILL (MISCELLANEOUS)
PACK LAMINECTOMY NEURO (CUSTOM PROCEDURE TRAY) ×2 IMPLANT
PAD DRESSING TELFA 3X8 NADH (GAUZE/BANDAGES/DRESSINGS) IMPLANT
PATTIES SURGICAL .25X.25 (GAUZE/BANDAGES/DRESSINGS) ×1 IMPLANT
PATTIES SURGICAL .75X.75 (GAUZE/BANDAGES/DRESSINGS) ×2 IMPLANT
SPONGE SURGIFOAM ABS GEL 100 (HEMOSTASIS) ×2 IMPLANT
SPONGE T-LAP 4X18 ~~LOC~~+RFID (SPONGE) ×3 IMPLANT
STAPLER VISISTAT (STAPLE) IMPLANT
STRIP CLOSURE SKIN 1/2X4 (GAUZE/BANDAGES/DRESSINGS) ×2 IMPLANT
SUT NURALON 4 0 TR CR/8 (SUTURE) IMPLANT
SUT PROLENE 3 0 PS 2 (SUTURE) ×1 IMPLANT
SUT VIC AB 1 CT1 27 (SUTURE) ×2
SUT VIC AB 1 CT1 27XBRD ANTBC (SUTURE) IMPLANT
SUT VIC AB 1-0 CT2 27 (SUTURE) IMPLANT
SUT VIC AB 2-0 CT1 27 (SUTURE) ×1
SUT VIC AB 2-0 CT1 TAPERPNT 27 (SUTURE) IMPLANT
SUT VIC AB 2-0 CT2 27 (SUTURE) IMPLANT
SYR 3ML LL SCALE MARK (SYRINGE) ×2 IMPLANT
TOWEL GREEN STERILE (TOWEL DISPOSABLE) ×2 IMPLANT
TOWEL GREEN STERILE FF (TOWEL DISPOSABLE) ×2 IMPLANT
TRAY FOLEY MTR SLVR 16FR STAT (SET/KITS/TRAYS/PACK) ×2 IMPLANT
WIPE CHG CHLORHEXIDINE 2% (PERSONAL CARE ITEMS) ×2 IMPLANT
YANKAUER SUCT BULB TIP NO VENT (SUCTIONS) ×2 IMPLANT

## 2022-01-11 NOTE — Discharge Instructions (Signed)

## 2022-01-11 NOTE — Interval H&P Note (Signed)
History and Physical Interval Note: ? ?01/11/2022 ?7:26 AM ? ?Aaron Nichols  has presented today for surgery, with the diagnosis of Spinal stenosis L3-4.  The various methods of treatment have been discussed with the patient and family. After consideration of risks, benefits and other options for treatment, the patient has consented to  Procedure(s) with comments: ?Microlumbar decompression L3-4 (N/A) - 3 C-Bed as a surgical intervention.  The patient's history has been reviewed, patient examined, no change in status, stable for surgery.  I have reviewed the patient's chart and labs.  Questions were answered to the patient's satisfaction.   ? ? ?[name] ? ? ?

## 2022-01-11 NOTE — Transfer of Care (Signed)
Immediate Anesthesia Transfer of Care Note ? ?Patient: Aaron Nichols ? ?Procedure(s) Performed: Microlumbar decompression Lumbar three-four ? ?Patient Location: PACU ? ?Anesthesia Type:General ? ?Level of Consciousness: drowsy and patient cooperative ? ?Airway & Oxygen Therapy: Patient Spontanous Breathing ? ?Post-op Assessment: Report given to RN and Post -op Vital signs reviewed and stable ? ?Post vital signs: Reviewed and stable ? ?Last Vitals:  ?Vitals Value Taken Time  ?BP 131/94 01/11/22 0954  ?Temp    ?Pulse 69 01/11/22 0959  ?Resp 23 01/11/22 0955  ?SpO2 97 % 01/11/22 0959  ?Vitals shown include unvalidated device data. ? ?Last Pain:  ?Vitals:  ? 01/11/22 0632  ?TempSrc:   ?PainSc: 4   ?   ? ?  ? ?Complications: No notable events documented. ?

## 2022-01-11 NOTE — Anesthesia Procedure Notes (Signed)
Procedure Name: Intubation ?Date/Time: 01/11/2022 7:48 AM ?Performed by: Aaron Saxon, CRNA ?Pre-anesthesia Checklist: Patient identified, Emergency Drugs available, Suction available and Patient being monitored ?Patient Re-evaluated:Patient Re-evaluated prior to induction ?Oxygen Delivery Method: Circle system utilized ?Preoxygenation: Pre-oxygenation with 100% oxygen ?Induction Type: IV induction ?Ventilation: Mask ventilation without difficulty and Oral airway inserted - appropriate to patient size ?Laryngoscope Size: Sabra Heck and 2 ?Grade View: Grade I ?Tube type: Oral ?Tube size: 7.5 mm ?Number of attempts: 1 ?Airway Equipment and Method: Patient positioned with wedge pillow and Stylet ?Placement Confirmation: ETT inserted through vocal cords under direct vision, positive ETCO2 and breath sounds checked- equal and bilateral ?Secured at: 24 cm ?Tube secured with: Tape ?Dental Injury: Teeth and Oropharynx as per pre-operative assessment  ? ? ? ? ?

## 2022-01-11 NOTE — Progress Notes (Signed)
Pharmacy Antibiotic Note ? ?[name] is a 51 y.o. male admitted on 01/11/2022 with surgical prophylaxis.  Pharmacy has been consulted for vancomycin if penicillin allergic. ? ?Per chart, patient was dizzy after cefuroxime. Not true allergy so will give cefazolin. Confirmed no drain per RN. Good renal function. ? ?Plan: ?Cefazolin 2g x1 ? ?Height: '5\' 10"'$  (177.8 cm) ?Weight: 103 kg (227 lb) ?IBW/kg (Calculated) : 73 ? ?Temp (24hrs), Avg:98.2 ?F (36.8 ?C), Min:97.8 ?F (36.6 ?C), Max:98.9 ?F (37.2 ?C) ? ?No results for input(s): WBC, CREATININE, LATICACIDVEN, VANCOTROUGH, VANCOPEAK, VANCORANDOM, GENTTROUGH, GENTPEAK, GENTRANDOM, TOBRATROUGH, TOBRAPEAK, TOBRARND, AMIKACINPEAK, AMIKACINTROU, AMIKACIN in the last 168 hours.  ?Estimated Creatinine Clearance: 123.6 mL/min (by C-G formula based on SCr of 0.85 mg/dL).   ? ?Allergies  ?Allergen Reactions  ? Ceftin [Cefuroxime Axetil]   ?  Dizziness,lightheaded  ? ?Benetta Spar, PharmD, BCPS, BCCP ?Clinical Pharmacist ? ?Please check AMION for all [facility] phone numbers ?After 10:00 PM, call [place] [phone] ? ?

## 2022-01-11 NOTE — Op Note (Signed)
NAME: Aaron Nichols, STALLING ?MEDICAL RECORD NO: [id number] ?ACCOUNT NO: [id number] ?DATE OF BIRTH: 04/05/1971 ?FACILITY: [facility] ?LOCATION: [facility] ?PHYSICIAN: [name], MD ? ?Operative Report  ? ?DATE OF PROCEDURE: 01/11/2022 ? ?PREOPERATIVE DIAGNOSIS:  Spinal stenosis at L3-L4. ? 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 ? ?PROCEDURE PERFORMED:  Bilateral lumbar decompression L3-L4 with L3-L4 foraminotomies, application of DuraGen to dural rent. ? ?ANESTHESIA:  General. ? ?ASSISTANT:  Cleophas Dunker, PA. ? ?HISTORY:  This is a 51 year old male who has had predominantly right over left lower extremity radicular pain, L3-L4 nerve root distribution, secondary to multifactorial spinal stenosis, L3-L4 disk protrusion paracentral to the right.  Refractory  ?conservative treatment including bleeding, infection, damage to neurovascular structures, no change in symptoms, worsening symptoms, DVT, PE, anesthetic complications, etc. ? ?DESCRIPTION OF PROCEDURE:  With the patient in supine position after induction of adequate general anesthesia, a gram and 1.5 grams of vancomycin and gentamicin due to history of staph infection.  He was placed prone on the Wilson frame.  All bony  ?prominences were well padded.  Foley to gravity.  Lumbar region was prepped and draped in the usual sterile fashion.  Two 18-gauge spinal needle was utilized to localize the L3-L4 interspace, confirmed with x-ray.  Incision was made from the spinous  ?process of L3 to below L4.  Subcutaneous tissue was dissected.  Electrocautery was utilized to achieve hemostasis.  The dorsal lumbar fascia divided in line with skin incision.  Paraspinous muscle elevated from lamina of L3-L4 bilaterally.  Amelia Jo  ?retractor was placed.  Operating microscope was draped and brought in the surgical field after confirmatory radiograph with Kochers on the spinous process of L3 and L4.  Next, a Leksell rongeur was utilized to remove  the spinous process of L3 and  ?partially of L4.  I then utilized a 2 mm Kerrison to begin a central laminotomy at L3 centrally continuing cephalad to the point of detaching the ligamentum flavum.  In a similar fashion, we began removing ligamentum flavum, which was hypertrophic  ?bilaterally starting with the central portion.  We continued centrally caudad with a Woodson retractor protecting the neural elements at all times.  I removed ligamentum flavum from the interspace after detaching ligamentum flavum from the cephalad edge  ?of L4 with micro curette.  With the neural elements protected, I performed hemilaminotomies of L4 bilaterally and a foraminotomy of L4 bilaterally.  No in-stenosis noted in both foramens.  This was performed with a 2 mm Kerrison, protecting the 4 roots  ?at all times.  Then, I continued with removal of the ligamentum flavum bilaterally.  This was first on the left.  With the dura protected with neuro patties and a Woodson retractor, we decompressed the lateral recess to the medial border of the pedicle.  ? We turned our attention to the right. He had severe stenosis in the right lateral recess.  We gently mobilized and developed a plane between the thecal sac and the hypertrophic facet and with a neuro patty protecting the dura. I then decompressed the  ?lateral recess to the medial border of the pedicle as well here.  I gently mobilized the thecal sac medially with a D'Errico, not crossing the midline, identified the disk space with a confirmatory radiograph.  This was then examined.  There was small  ?bulging there, but no evidence of disk herniation at the disk space out into the foramen cephalad or caudad or beneath the thecal sac and  swept that with a Youth worker.  With the neural elements well protected, I performed a foraminotomy at L3 and  ?decompressed the lateral recess.  Following that, there was good restoration of the thecal sac.  There was one 2 mm attenuation in the  thecal sac noted dorsolaterally at the disk space.  No evidence of CSF leakage.  I then reconstituted some DuraGen  ?patch and placed a patch over the area for reinforcement over the region approximately 1 x 1.5 cm.  I then performed a Valsalva to 40.  No CSF leakage.  We achieved strict hemostasis with bipolar cautery.  Bone wax was placed in all cancellous surfaces.  ? I copiously irrigated with antibiotic irrigation. Again, revealed no evidence of CSF leakage or active bleeding.  I removed the McCulloch retractor, irrigated the paraspinous musculature.  achieved hemostasis with bipolar and then reapproximated the  ?dorsal lumbar fascia with 1 Vicryl in interrupted figure-of-eight sutures.  The small centimeter aperture was left distally for drainage of any bleeding if occurred, subcutaneous with 2-0 and skin with staples.  Wound was dressed sterilely, placed supine ? on the hospital bed, extubated without difficulty and transported to the recovery room in satisfactory condition. ? ?The patient tolerated the procedure well.  No complication.   ? ?ASSISTANT: Cleophas Dunker, PA. ? ?Minimal blood loss of 20 mL. ? ? ?PAA ?D: 01/11/2022 9:46:38 am T: 01/11/2022 11:19:00 am  ?JOB: [id number]/ [id number]  ?

## 2022-01-11 NOTE — Anesthesia Postprocedure Evaluation (Signed)
Anesthesia Post Note ? ?Patient: Aaron Nichols ? ?Procedure(s) Performed: Microlumbar decompression Lumbar three-four ? ?  ? ?Patient location during evaluation: PACU ?Anesthesia Type: General ?Level of consciousness: awake and alert ?Pain management: pain level controlled ?Vital Signs Assessment: post-procedure vital signs reviewed and stable ?Respiratory status: spontaneous breathing, nonlabored ventilation, respiratory function stable and patient connected to nasal cannula oxygen ?Cardiovascular status: blood pressure returned to baseline and stable ?Postop Assessment: no apparent nausea or vomiting ?Anesthetic complications: no ? ? ?No notable events documented. ? ?Last Vitals:  ?Vitals:  ? 01/11/22 1208 01/11/22 1233  ?BP: 112/71 137/81  ?Pulse: (!) 57 66  ?Resp: 16 20  ?Temp: 37.2 ?C 36.6 ?C  ?SpO2: 94% 97%  ?  ?Last Pain:  ?Vitals:  ? 01/11/22 1233  ?TempSrc: Oral  ?PainSc:   ? ? ?  ?  ?  ?  ?  ?  ? ?Suzette Battiest E ? ? ? ? ?

## 2022-01-11 NOTE — Evaluation (Signed)
Physical Therapy Evaluation ?Patient Details ?Name: Aaron Nichols ?MRN: [id number] ?DOB: [birth year] ?Today's Date: 01/11/2022 ? ?History of Present Illness ? 51 y/o male admitted on 01/11/22 following microlumbar decompression L3-4. PMH: HTN, brain tumor (acoustic neuroma) s/p excision.  ?Clinical Impression ? Patient admitted following procedure above. Patient currently limited by pain but eager to mobilize and return home. Patient required minA at times during ambulation due to sharp shooting pain with associated mild knee buckling. Educated patient and wife on back precautions, progressive walking program, and safe positioning of wife during ambulation, patient and wife verbalized understanding. Patient will benefit from skilled PT services during acute stay to address listed deficits. Recommend HHPT at discharge to maximize functional independence and mobility.    ?   ? ?Recommendations for follow up therapy are one component of a multi-disciplinary discharge planning process, led by the attending physician.  Recommendations may be updated based on patient status, additional functional criteria and insurance authorization. ? ?Follow Up Recommendations Home health PT ? ?  ?Assistance Recommended at Discharge Frequent or constant Supervision/Assistance  ?Patient can return home with the following ? A little help with walking and/or transfers;A little help with bathing/dressing/bathroom ? ?  ?Equipment Recommendations None recommended by PT  ?Recommendations for Other Services ?    ?  ?Functional Status Assessment Patient has had a recent decline in their functional status and demonstrates the ability to make significant improvements in function in a reasonable and predictable amount of time.  ? ?  ?Precautions / Restrictions Precautions ?Precautions: Back;Fall ?Precaution Booklet Issued: Yes (comment) ?Required Braces or Orthoses:  (no brace needed) ?Restrictions ?Weight Bearing Restrictions: No  ? ?  ? ?Mobility ? Bed  Mobility ?Overal bed mobility: Needs Assistance ?Bed Mobility: Rolling, Sidelying to Sit ?Rolling: Supervision ?Sidelying to sit: Supervision ?  ?  ?  ?General bed mobility comments: heavy use of bedrails but no physical assist required ?  ? ?Transfers ?Overall transfer level: Needs assistance ?  ?Transfers: Sit to/from Stand ?Sit to Stand: Min assist ?  ?  ?  ?  ?  ?General transfer comment: assist for boost up into standing ?  ? ?Ambulation/Gait ?Ambulation/Gait assistance: Min assist ?Gait Distance (Feet): 150 Feet ?Assistive device: Rolling Algenis Ballin (2 wheels) ?Gait Pattern/deviations: Step-to pattern, Decreased stride length, Decreased stance time - left ?Gait velocity: decreased ?  ?  ?General Gait Details: bilateral knee instability with ambulation. MinA for balance as patient tends to demo knee buckling when pain hits strongly. Improved stability with RW ? ?Stairs ?Stairs: Yes ?Stairs assistance: Min assist ?Stair Management: One rail Left, Step to pattern, Forwards (HHAx1 on R) ?Number of Stairs: 3 ?General stair comments: minA for balance with stair negotiation. Instruction on safe stair negotiation with up with good and down with bad ? ?Wheelchair Mobility ?  ? ?Modified Rankin (Stroke Patients Only) ?  ? ?  ? ?Balance Overall balance assessment: Needs assistance ?Sitting-balance support: No upper extremity supported, Feet supported ?Sitting balance-Leahy Scale: Fair ?  ?  ?Standing balance support: Bilateral upper extremity supported, Reliant on assistive device for balance ?Standing balance-Leahy Scale: Poor ?Standing balance comment: reliant on UE support ?  ?  ?  ?  ?  ?  ?  ?  ?  ?  ?  ?   ? ? ? ?Pertinent Vitals/Pain Pain Assessment ?Pain Assessment: 0-10 ?Pain Score: 9  ?Pain Location: back ?Pain Descriptors / Indicators: Discomfort, Grimacing, Shooting ?Pain Intervention(s): Monitored during session, Repositioned  ? ? ?Home Living  Family/patient expects to be discharged to:: Private  residence ?Living Arrangements: Spouse/significant other ?Available Help at Discharge: Family ?Type of Home: House ?Home Access: Stairs to enter ?Entrance Stairs-Rails: Left ?Entrance Stairs-Number of Steps: 6 ?  ?Home Layout: One level ?Home Equipment: Cane - single Barista (2 wheels) ?   ?  ?Prior Function Prior Level of Function : Independent/Modified Independent ?  ?  ?  ?  ?  ?  ?Mobility Comments: uses cane for long distances ?  ?  ? ? ?Hand Dominance  ?   ? ?  ?Extremity/Trunk Assessment  ? Upper Extremity Assessment ?Upper Extremity Assessment: Defer to OT evaluation ?  ? ?Lower Extremity Assessment ?Lower Extremity Assessment: Generalized weakness ?  ? ?Cervical / Trunk Assessment ?Cervical / Trunk Assessment: Back Surgery  ?Communication  ? Communication: No difficulties  ?Cognition Arousal/Alertness: Awake/alert ?Behavior During Therapy: [facility] for tasks assessed/performed ?Overall Cognitive Status: Within Functional Limits for tasks assessed ?  ?  ?  ?  ?  ?  ?  ?  ?  ?  ?  ?  ?  ?  ?  ?  ?  ?  ?  ? ?  ?General Comments   ? ?  ?Exercises    ? ?Assessment/Plan  ?  ?PT Assessment Patient needs continued PT services  ?PT Problem List Decreased strength;Decreased activity tolerance;Decreased balance;Decreased mobility;Decreased knowledge of precautions ? ?   ?  ?PT Treatment Interventions DME instruction;Gait training;Stair training;Functional mobility training;Therapeutic activities;Therapeutic exercise;Balance training;Patient/family education   ? ?PT Goals (Current goals can be found in the Care Plan section)  ?Acute Rehab PT Goals ?Patient Stated Goal: to go home ?PT Goal Formulation: With patient ?Time For Goal Achievement: 01/25/22 ?Potential to Achieve Goals: Good ? ?  ?Frequency Min 5X/week ?  ? ? ?Co-evaluation   ?  ?  ?  ?  ? ? ?  ?AM-PAC PT "6 Clicks" Mobility  ?Outcome Measure Help needed turning from your back to your side while in a flat bed without using bedrails?: A Little ?Help  needed moving from lying on your back to sitting on the side of a flat bed without using bedrails?: A Little ?Help needed moving to and from a bed to a chair (including a wheelchair)?: A Little ?Help needed standing up from a chair using your arms (e.g., wheelchair or bedside chair)?: A Little ?Help needed to walk in hospital room?: A Little ?Help needed climbing 3-5 steps with a railing? : A Little ?6 Click Score: 18 ? ?  ?End of Session Equipment Utilized During Treatment: Gait belt ?Activity Tolerance: Patient tolerated treatment well ?Patient left: in bed;with call bell/phone within reach ?Nurse Communication: Mobility status ?PT Visit Diagnosis: Muscle weakness (generalized) (M62.81);Unsteadiness on feet (R26.81) ?  ? ?Time: 2458-0998 ?PT Time Calculation (min) (ACUTE ONLY): 39 min ? ? ?Charges:   PT Evaluation ?$PT Eval Moderate Complexity: 1 Mod ?PT Treatments ?$Gait Training: 23-37 mins ?  ?   ? ? ?Teonna Coonan A. Gilford Rile, PT, DPT ?Acute Rehabilitation Services ?Pager [phone] ?Office [phone] ? ? ?Shequita Peplinski A Burdett Pinzon ?01/11/2022, 5:27 PM ? ?

## 2022-01-11 NOTE — Progress Notes (Signed)
Patient alert and oriented, voiding adequately, MAE well with no difficulty. Incision area cdi with no s/s of infection. Patient discharged home per order. Patient and spousestated understanding of discharge instructions given. Patient has an appointment with Dr. Tonita Cong in 2 weeks ?

## 2022-01-11 NOTE — Anesthesia Preprocedure Evaluation (Signed)
Anesthesia Evaluation  ?Patient identified by MRN, date of birth, ID band ?Patient awake ? ? ? ?Reviewed: ?Allergy & Precautions, NPO status , Patient's Chart, lab work & pertinent test results ? ?History of Anesthesia Complications ?(+) PONV and history of anesthetic complications ? ?Airway ?Mallampati: II ? ?TM Distance: >3 FB ?Neck ROM: Full ? ? ? Dental ? ?(+) Dental Advisory Given ?  ?Pulmonary ?neg pulmonary ROS, former smoker,  ?  ?breath sounds clear to auscultation ? ? ? ? ? ? Cardiovascular ?hypertension, Pt. on medications ? ?Rhythm:Regular Rate:Normal ? ? ?  ?Neuro/Psych ? Neuromuscular disease   ? GI/Hepatic ?Neg liver ROS, GERD  ,  ?Endo/Other  ?negative endocrine ROS ? Renal/GU ?Renal disease  ? ?  ?Musculoskeletal ? ?(+) Arthritis ,  ? Abdominal ?  ?Peds ? Hematology ?negative hematology ROS ?(+)   ?Anesthesia Other Findings ? ? Reproductive/Obstetrics ? ?  ? ? ? ? ? ? ? ? ? ? ? ? ? ?  ?  ? ? ? ? ? ? ? ? ?Anesthesia Physical ?Anesthesia Plan ? ?ASA: 2 ? ?Anesthesia Plan: General  ? ?Post-op Pain Management: Gabapentin PO (pre-op)*, Tylenol PO (pre-op)* and Toradol IV (intra-op)*  ? ?Induction: Intravenous ? ?PONV Risk Score and Plan: 3 and Midazolam, Dexamethasone, Ondansetron and Treatment may vary due to age or medical condition ? ?Airway Management Planned: Oral ETT ? ?Additional Equipment: None ? ?Intra-op Plan:  ? ?Post-operative Plan: Extubation in OR ? ?Informed Consent: I have reviewed the patients History and Physical, chart, labs and discussed the procedure including the risks, benefits and alternatives for the proposed anesthesia with the patient or authorized representative who has indicated his/her understanding and acceptance.  ? ? ? ?Dental advisory given ? ?Plan Discussed with: CRNA ? ?Anesthesia Plan Comments:   ? ? ? ? ? ? ?Anesthesia Quick Evaluation ? ?

## 2022-01-11 NOTE — Evaluation (Signed)
Occupational Therapy Evaluation ?Patient Details ?Name: Aaron Nichols ?MRN: [id number] ?DOB: 1971/02/25 ?Today's Date: 01/11/2022 ? ? ?History of Present Illness 51 y/o male admitted on 01/11/22 following microlumbar decompression L3-4. PMH: HTN, brain tumor (acoustic neuroma) s/p excision.  ? ?Clinical Impression ?  ?Patient admitted for the procedure above.  PTA he lives at home with his spouse, who is able to assist as needed.  He walked with a SPC and was generally independent with ADL/IADL, and continues to drive.  Currently back pain is his primary deficit.  He is able to mobilize in his room with generalized supervision, and needed Min A for ADL completion at sit/stand level with hip kit as needed.  Patient is looking to discharge today, and no post acute OT is anticipated.  Back precautions reviewed, and all questions answered.    ?   ? ?Recommendations for follow up therapy are one component of a multi-disciplinary discharge planning process, led by the attending physician.  Recommendations may be updated based on patient status, additional functional criteria and insurance authorization.  ? ?Follow Up Recommendations ? No OT follow up  ?  ?Assistance Recommended at Discharge Intermittent Supervision/Assistance  ?Patient can return home with the following   ? ?  ?Functional Status Assessment ? Patient has had a recent decline in their functional status and demonstrates the ability to make significant improvements in function in a reasonable and predictable amount of time.  ?Equipment Recommendations ? None recommended by OT  ?  ?Recommendations for Other Services   ? ? ?  ?Precautions / Restrictions Precautions ?Precautions: Back;Fall ?Precaution Booklet Issued: Yes (comment) ?Required Braces or Orthoses:  (no brace needed) ?Restrictions ?Weight Bearing Restrictions: No  ? ?  ? ?Mobility Bed Mobility ?  ?  ?  ?  ?  ?  ?  ?General bed mobility comments: sitting EOB ?  ? ?Transfers ?Overall transfer level: Needs  assistance ?  ?Transfers: Sit to/from Stand ?Sit to Stand: Min assist ?  ?  ?  ?  ?  ?  ?  ? ?  ?Balance Overall balance assessment: Needs assistance ?Sitting-balance support: No upper extremity supported, Feet supported ?Sitting balance-Leahy Scale: Fair ?  ?  ?Standing balance support: Bilateral upper extremity supported, Reliant on assistive device for balance ?Standing balance-Leahy Scale: Fair ?  ?  ?  ?  ?  ?  ?  ?  ?  ?  ?  ?  ?   ? ?ADL either performed or assessed with clinical judgement  ? ?ADL Overall ADL's : At baseline ?  ?  ?  ?  ?  ?  ?  ?  ?  ?  ?  ?  ?  ?  ?  ?  ?  ?  ?  ?   ? ? ? ?Vision Patient Visual Report: No change from baseline ?   ?   ?Perception Perception ?Perception: Not tested ?  ?Praxis Praxis ?Praxis: Not tested ?  ? ?Pertinent Vitals/Pain Pain Assessment ?Pain Assessment: Faces ?Faces Pain Scale: Hurts even more ?Pain Location: back ?Pain Descriptors / Indicators: Discomfort, Grimacing, Shooting, Spasm ?Pain Intervention(s): Monitored during session  ? ? ? ?Hand Dominance Right ?  ?Extremity/Trunk Assessment Upper Extremity Assessment ?Upper Extremity Assessment: Overall WFL for tasks assessed ?  ?Lower Extremity Assessment ?Lower Extremity Assessment: Generalized weakness ?  ?Cervical / Trunk Assessment ?Cervical / Trunk Assessment: Back Surgery ?  ?Communication Communication ?Communication: No difficulties ?  ?Cognition Arousal/Alertness: Awake/alert ?Behavior During  Therapy: WFL for tasks assessed/performed ?Overall Cognitive Status: Within Functional Limits for tasks assessed ?  ?  ?  ?  ?  ?  ?  ?  ?  ?  ?  ?  ?  ?  ?  ?  ?  ?  ?  ?General Comments   VSS ? ?  ?Exercises   ?  ?Shoulder Instructions    ? ? ?Home Living Family/patient expects to be discharged to:: Private residence ?Living Arrangements: Spouse/significant other ?Available Help at Discharge: Family ?Type of Home: House ?Home Access: Stairs to enter ?Entrance Stairs-Number of Steps: 6 ?Entrance Stairs-Rails:  Left ?Home Layout: One level ?  ?  ?  ?  ?  ?  ?  ?Home Equipment: Cane - single Barista (2 wheels) ?  ?  ?  ? ?  ?Prior Functioning/Environment Prior Level of Function : Independent/Modified Independent ?  ?  ?  ?  ?  ?  ?Mobility Comments: uses cane for long distances ?  ?  ? ?  ?  ?OT Problem List: Pain;Impaired balance (sitting and/or standing) ?  ?   ?OT Treatment/Interventions:    ?  ?OT Goals(Current goals can be found in the care plan section) Acute Rehab OT Goals ?Patient Stated Goal: Return home ?OT Goal Formulation: With patient ?Time For Goal Achievement: 01/15/22 ?Potential to Achieve Goals: Good  ?OT Frequency:   ?  ? ?Co-evaluation   ?  ?  ?  ?  ? ?  ?AM-PAC OT "6 Clicks" Daily Activity     ?Outcome Measure Help from another person eating meals?: None ?Help from another person taking care of personal grooming?: None ?Help from another person toileting, which includes using toliet, bedpan, or urinal?: A Little ?Help from another person bathing (including washing, rinsing, drying)?: A Little ?Help from another person to put on and taking off regular upper body clothing?: None ?Help from another person to put on and taking off regular lower body clothing?: A Little ?6 Click Score: 21 ?  ?End of Session Equipment Utilized During Treatment: Rolling walker (2 wheels) ?Nurse Communication: Mobility status ? ?Activity Tolerance: Patient tolerated treatment well ?Patient left: in bed;with call bell/phone within reach;with family/visitor present ? ?OT Visit Diagnosis: Pain;Unsteadiness on feet (R26.81)  ?              ?Time: 2446-9507 ?OT Time Calculation (min): 19 min ?Charges:  OT General Charges ?$OT Visit: 1 Visit ?OT Evaluation ?$OT Eval Moderate Complexity: 1 Mod ? ?01/11/2022 ? ?RP, OTR/L ? ?Acute Rehabilitation Services ? ?Office:  [phone] ? ? ?Katrinka Herbison D Genoveva Singleton ?01/11/2022, 5:41 PM ?

## 2022-01-11 NOTE — Progress Notes (Signed)
Confirmed with Dr. Tonita Cong he wants Vanc 1500 mg to be administered.  ?

## 2022-01-11 NOTE — Brief Op Note (Signed)
01/11/2022 ? ?7:35 AM ? ?PATIENT:  Aaron Nichols  51 y.o. male ? ?PRE-OPERATIVE DIAGNOSIS:  Spinal stenosis L3-4 ? ?POST-OPERATIVE DIAGNOSIS:  * No post-op diagnosis entered * ? ?PROCEDURE:  Procedure(s) with comments: ?Microlumbar decompression L3-4 (N/A) - 3 C-Bed ? ?SURGEON:  Surgeon(s) and Role: ?   Susa Day, MD - Primary ? ?PHYSICIAN ASSISTANT:  ? ?ASSISTANTS: Bissell  ? ?ANESTHESIA:   general ? ?EBL:  min  ? ?BLOOD ADMINISTERED:none ? ?DRAINS: none  ? ?LOCAL MEDICATIONS USED:  MARCAINE    ? ?SPECIMEN:  No Specimen ? ?DISPOSITION OF SPECIMEN:  N/A ? ?COUNTS:  YES ? ?TOURNIQUET:  * No tourniquets in log * ? ?DICTATION: .Other Dictation: Dictation Number [id number] ? ?PLAN OF CARE: Admit for overnight observation ? ?PATIENT DISPOSITION:  PACU - hemodynamically stable. ?  ?Delay start of Pharmacological VTE agent (>24hrs) due to surgical blood loss or risk of bleeding: yes ? ?

## 2022-01-12 ENCOUNTER — Encounter (HOSPITAL_COMMUNITY): Payer: Self-pay | Admitting: Specialist

## 2022-08-28 ENCOUNTER — Ambulatory Visit: Payer: Self-pay | Admitting: Psychiatry

## 2022-10-31 ENCOUNTER — Encounter: Payer: Self-pay | Admitting: Psychiatry

## 2022-10-31 ENCOUNTER — Ambulatory Visit: Payer: BC Managed Care – PPO | Admitting: Psychiatry

## 2022-10-31 VITALS — BP 165/98 | HR 76 | Temp 98.5°F | Ht 70.0 in | Wt 230.0 lb

## 2022-10-31 DIAGNOSIS — F410 Panic disorder [episodic paroxysmal anxiety] without agoraphobia: Secondary | ICD-10-CM | POA: Insufficient documentation

## 2022-10-31 DIAGNOSIS — F419 Anxiety disorder, unspecified: Secondary | ICD-10-CM | POA: Diagnosis not present

## 2022-10-31 DIAGNOSIS — G4700 Insomnia, unspecified: Secondary | ICD-10-CM | POA: Insufficient documentation

## 2022-10-31 DIAGNOSIS — F322 Major depressive disorder, single episode, severe without psychotic features: Secondary | ICD-10-CM | POA: Insufficient documentation

## 2022-10-31 MED ORDER — BUSPIRONE HCL 10 MG PO TABS: 10.0000 mg | ORAL_TABLET | Freq: Two times a day (BID) | ORAL | 1 refills | Status: DC

## 2022-10-31 MED ORDER — PROPRANOLOL HCL 10 MG PO TABS: 10.0000 mg | ORAL_TABLET | Freq: Two times a day (BID) | ORAL | 1 refills | Status: DC | PRN
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
## 2022-10-31 NOTE — Patient Instructions (Signed)
Buspirone Tablets What is this medication? BUSPIRONE (byoo SPYE rone) treats anxiety. It works by balancing the levels of dopamine and serotonin in your brain, substances that help regulate mood. This medicine may be used for other purposes; ask your health care provider or pharmacist if you have questions. COMMON BRAND NAME(S): BuSpar, Buspar Dividose What should I tell my care team before I take this medication? They need to know if you have any of these conditions: Kidney or liver disease An unusual or allergic reaction to buspirone, other medications, foods, dyes, or preservatives Pregnant or trying to get pregnant Breast-feeding How should I use this medication? Take this medication by mouth with a glass of water. Follow the directions on the prescription label. You may take this medication with or without food. To ensure that this medication always works the same way for you, you should take it either always with or always without food. Take your doses at regular intervals. Do not take your medication more often than directed. Do not stop taking except on the advice of your care team. Talk to your care team about the use of this medication in children. Special care may be needed. Overdosage: If you think you have taken too much of this medicine contact a poison control center or emergency room at once. NOTE: This medicine is only for you. Do not share this medicine with others. What if I miss a dose? If you miss a dose, take it as soon as you can. If it is almost time for your next dose, take only that dose. Do not take double or extra doses. What may interact with this medication? Do not take this medication with any of the following: Linezolid MAOIs like Carbex, Eldepryl, Marplan, Nardil, and Parnate Methylene blue Procarbazine This medication may also interact with the following: Diazepam Digoxin Diltiazem Erythromycin Grapefruit juice Haloperidol Medications for mental  depression or mood problems Medications for seizures like carbamazepine, phenobarbital and phenytoin Nefazodone Other medications for anxiety Rifampin Ritonavir Some antifungal medications like itraconazole, ketoconazole, and voriconazole Verapamil Warfarin This list may not describe all possible interactions. Give your health care provider a list of all the medicines, herbs, non-prescription drugs, or dietary supplements you use. Also tell them if you smoke, drink alcohol, or use illegal drugs. Some items may interact with your medicine. What should I watch for while using this medication? Visit your care team for regular checks on your progress. It may take 1 to 2 weeks before your anxiety gets better. This medication may affect your coordination, reaction time, or judgment. Do not drive or operate machinery until you know how this medication affects you. Sit up or stand slowly to reduce the risk of dizzy or fainting spells. Drinking alcohol with this medication can increase the risk of these side effects. What side effects may I notice from receiving this medication? Side effects that you should report to your care team as soon as possible: Allergic reactions--skin rash, itching, hives, swelling of the face, lips, tongue, or throat Irritability, confusion, fast or irregular heartbeat, muscle stiffness, twitching muscles, sweating, high fever, seizure, chills, vomiting, diarrhea, which may be signs of serotonin syndrome Side effects that usually do not require medical attention (report to your care team if they continue or are bothersome): Anxiety, nervousness Dizziness Drowsiness Headache Nausea Trouble sleeping This list may not describe all possible side effects. Call your doctor for medical advice about side effects. You may report side effects to FDA at 1-800-FDA-1088. Where should I keep  my medication? Keep out of the reach of children. Store at room temperature below 30 degrees C  (86 degrees F). Protect from light. Keep container tightly closed. Throw away any unused medication after the expiration date. NOTE: This sheet is a summary. It may not cover all possible information. If you have questions about this medicine, talk to your doctor, pharmacist, or health care provider.  2023 Elsevier/Gold Standard (2007-11-29 00:00:00) Propranolol Tablets What is this medication? PROPRANOLOL (proe PRAN oh lole) treats many conditions such as high blood pressure, tremors, and a type of arrhythmia known as AFib (atrial fibrillation). It works by lowering your blood pressure and heart rate, making it easier for your heart to pump blood to the rest of your body. It may be used to prevent migraine headaches. It works by relaxing the blood vessels in the brain that cause migraines. It belongs to a group of medications called beta blockers. This medicine may be used for other purposes; ask your health care provider or pharmacist if you have questions. COMMON BRAND NAME(S): Inderal What should I tell my care team before I take this medication? They need to know if you have any of these conditions: Circulation problems or blood vessel disease Diabetes History of heart attack or heart disease, vasospastic angina Kidney disease Liver disease Lung or breathing disease, like asthma or emphysema Pheochromocytoma Slow heart rate Thyroid disease An unusual or allergic reaction to propranolol, other beta-blockers, medications, foods, dyes, or preservatives Pregnant or trying to get pregnant Breast-feeding How should I use this medication? Take this medication by mouth. Take it as directed on the prescription label at the same time every day. Keep taking it unless your care team tells you to stop. Talk to your care team about the use of this medication in children. Special care may be needed. Overdosage: If you think you have taken too much of this medicine contact a poison control center or  emergency room at once. NOTE: This medicine is only for you. Do not share this medicine with others. What if I miss a dose? If you miss a dose, take it as soon as you can. If it is almost time for your next dose, take only that dose. Do not take double or extra doses. What may interact with this medication? Do not take this medication with any of the following: Feverfew Phenothiazines like chlorpromazine, mesoridazine, prochlorperazine, thioridazine This medication may also interact with the following: Aluminum hydroxide gel Antipyrine Antiviral medications for HIV or AIDS Barbiturates like phenobarbital Certain medications for blood pressure, heart disease, irregular heart beat Cimetidine Ciprofloxacin Diazepam Fluconazole Haloperidol Isoniazid Medications for cholesterol like cholestyramine or colestipol Medications for mental depression Medications for migraine headache like almotriptan, eletriptan, frovatriptan, naratriptan, rizatriptan, sumatriptan, zolmitriptan NSAIDs, medications for pain and inflammation, like ibuprofen or naproxen Phenytoin Rifampin Teniposide Theophylline Thyroid medications Tolbutamide Warfarin Zileuton This list may not describe all possible interactions. Give your health care provider a list of all the medicines, herbs, non-prescription drugs, or dietary supplements you use. Also tell them if you smoke, drink alcohol, or use illegal drugs. Some items may interact with your medicine. What should I watch for while using this medication? Visit your care team for regular checks on your progress. Check your blood pressure as directed. Ask your care team what your blood pressure should be. Also, find out when you should contact him or her. Do not treat yourself for coughs, colds, or pain while you are using this medication without asking your care  team for advice. Some medications may increase your blood pressure. You may get drowsy or dizzy. Do not drive,  use machinery, or do anything that needs mental alertness until you know how this medication affects you. Do not stand up or sit up quickly, especially if you are an older patient. This reduces the risk of dizzy or fainting spells. Alcohol may interfere with the effect of this medication. Avoid alcoholic drinks. This medication may increase blood sugar. Ask your care team if changes in diet or medications are needed if you have diabetes. What side effects may I notice from receiving this medication? Side effects that you should report to your care team as soon as possible: Allergic reactions--skin rash, itching, hives, swelling of the face, lips, tongue, or throat Heart failure--shortness of breath, swelling of the ankles, feet, or hands, sudden weight gain, unusual weakness or fatigue Low blood pressure--dizziness, feeling faint or lightheaded, blurry vision Raynaud's--cool, numb, or painful fingers or toes that may change color from pale, to blue, to red Redness, blistering, peeling, or loosening of the skin, including inside the mouth Slow heartbeat--dizziness, feeling faint or lightheaded, confusion, trouble breathing, unusual weakness or fatigue Worsening mood, feelings of depression Side effects that usually do not require medical attention (report to your care team if they continue or are bothersome): Change in sex drive or performance Diarrhea Dizziness Fatigue Headache This list may not describe all possible side effects. Call your doctor for medical advice about side effects. You may report side effects to FDA at 1-800-FDA-1088. Where should I keep my medication? Keep out of the reach of children and pets. Store at room temperature between 20 and 25 degrees C (68 and 77 degrees F). Protect from light. Throw away any unused medication after the expiration date. NOTE: This sheet is a summary. It may not cover all possible information. If you have questions about this medicine, talk to  your doctor, pharmacist, or health care provider.  2023 Elsevier/Gold Standard (2021-01-15 00:00:00)

## 2022-11-02 ENCOUNTER — Encounter: Payer: Self-pay | Admitting: Psychiatry

## 2022-11-26 ENCOUNTER — Other Ambulatory Visit
Admission: RE | Admit: 2022-11-26 | Discharge: 2022-11-26 | Disposition: A | Discharge status: Home / Self Care | Payer: BC Managed Care – PPO | Attending: Psychiatry | Admitting: Psychiatry

## 2022-11-26 DIAGNOSIS — F322 Major depressive disorder, single episode, severe without psychotic features: Secondary | ICD-10-CM | POA: Diagnosis present

## 2022-11-26 DIAGNOSIS — G4700 Insomnia, unspecified: Secondary | ICD-10-CM | POA: Diagnosis present

## 2022-11-26 DIAGNOSIS — F419 Anxiety disorder, unspecified: Secondary | ICD-10-CM | POA: Diagnosis present

## 2022-11-26 LAB — TSH: TSH: 1.925 u[IU]/mL (ref 0.350–4.500)

## 2022-12-12 ENCOUNTER — Ambulatory Visit (INDEPENDENT_AMBULATORY_CARE_PROVIDER_SITE_OTHER): Payer: BC Managed Care – PPO | Admitting: Psychiatry

## 2022-12-12 ENCOUNTER — Encounter: Payer: Self-pay | Admitting: Psychiatry

## 2022-12-12 VITALS — BP 165/82 | HR 80 | Temp 98.3°F | Ht 70.0 in | Wt 229.0 lb

## 2022-12-12 DIAGNOSIS — Z79899 Other long term (current) drug therapy: Secondary | ICD-10-CM

## 2022-12-12 DIAGNOSIS — F322 Major depressive disorder, single episode, severe without psychotic features: Secondary | ICD-10-CM

## 2022-12-12 DIAGNOSIS — G4701 Insomnia due to medical condition: Secondary | ICD-10-CM

## 2022-12-12 DIAGNOSIS — F411 Generalized anxiety disorder: Secondary | ICD-10-CM

## 2022-12-12 MED ORDER — CARBAMAZEPINE ER 100 MG PO TB12: 100.0000 mg | ORAL_TABLET | Freq: Two times a day (BID) | ORAL | 1 refills | Status: DC

## 2022-12-12 MED ORDER — BUSPIRONE HCL 10 MG PO TABS: 10.0000 mg | ORAL_TABLET | Freq: Two times a day (BID) | ORAL | 1 refills | Status: DC

## 2022-12-12 NOTE — Patient Instructions (Signed)
Carbamazepine Tablets What is this medication? CARBAMAZEPINE (kar ba MAZ e peen) prevents and controls seizures in people with epilepsy. It may also be used to treat nerve pain. It works by calming overactive nerves in your body. This medicine may be used for other purposes; ask your health care provider or pharmacist if you have questions. COMMON BRAND NAME(S): Epitol, Tegretol What should I tell my care team before I take this medication? They need to know if you have any of these conditions: Asian ancestry Bone marrow disease Glaucoma Heart disease Irregular heartbeat or rhythm Kidney disease Liver disease Low blood cell levels (white cells, red cells, or platelets) Mental health conditions Porphyria Suicidal thoughts, plans, or attempt by you or a family member An unusual or allergic reaction to carbamazepine, other medications, foods, dyes, or preservatives Pregnant or trying to get pregnant Breastfeeding How should I use this medication? Take this medication by mouth with a glass of water. Follow the directions on the prescription label. Take this medication with food. Take your doses at regular intervals. Do not take your medication more often than directed. Do not stop taking this medication except on the advice of your care team. A special MedGuide will be given to you by the pharmacist with each prescription and refill. Be sure to read this information carefully each time. Talk to your care team about the use of this medication in children. Special care may be needed. Overdosage: If you think you have taken too much of this medicine contact a poison control center or emergency room at once. NOTE: This medicine is only for you. Do not share this medicine with others. What if I miss a dose? If you miss a dose, take it as soon as you can. If it is almost time for your next dose, take only that dose. Do not take double or extra doses. What may interact with this medication? Do not  take this medication with any of the following: Certain medications used to treat HIV infection or AIDS that are given in combination with cobicistat Delavirdine MAOIs like Carbex, Eldepryl, Marplan, Nardil, and Parnate Nefazodone Oxcarbazepine This medication may also interact with the following: Acetaminophen Acetazolamide Barbiturate medications for inducing sleep or treating seizures, like phenobarbital Certain antibiotics like clarithromycin, erythromycin or troleandomycin Cimetidine Cyclosporine Danazol Dicumarol Doxycycline Male hormones, including estrogens and birth control pills Grapefruit juice Isoniazid, INH Levothyroxine and other thyroid hormones Lithium and other medications to treat mood problems or psychotic disturbances Loratadine Medications for angina or high blood pressure Medications for cancer Medications for depression or anxiety Medications for sleep Medications to treat fungal infections, like fluconazole, itraconazole or ketoconazole Medications used to treat HIV infection or AIDS Methadone Niacinamide Praziquantel Propoxyphene Rifampin or rifabutin Seizure or epilepsy medication Steroid medications such as prednisone or cortisone Theophylline Tramadol Warfarin This list may not describe all possible interactions. Give your health care provider a list of all the medicines, herbs, non-prescription drugs, or dietary supplements you use. Also tell them if you smoke, drink alcohol, or use illegal drugs. Some items may interact with your medicine. What should I watch for while using this medication? Visit your care team for regular checks on your progress. Do not change brands or dosage forms of this medication without discussing it with your care team. If you are taking this medication for epilepsy (seizures), do not stop taking it suddenly. This increases the risk of seizures. Wear a Probation officer or necklace. Carry an identification card with  information about  your condition, medications, and care team. This medication may cause serious skin reactions. They can happen weeks to months after starting the medication. Contact your care team right away if you notice fevers or flu-like symptoms with a rash. The rash may be red or purple and then turn into blisters or peeling of the skin. You may also notice a red rash with swelling of the face, lips, or lymph nodes in your neck or under your arms. This medication may affect your coordination, reaction time, or judgment. Do not drive or operate machinery until you know how this medication affects you. Sit up or stand slowly to reduce the risk of dizzy or fainting spells. Drinking alcohol with this medication can increase the risk of these side effects. Estrogen and progestin hormones may not work as well while you are taking this medication. A barrier contraceptive, such as a condom or diaphragm, is recommended if you are using these hormones for contraception. Talk to your care team about effective forms of contraception. This medication can make you more sensitive to the sun. Keep out of the sun. If you cannot avoid being in the sun, wear protective clothing and sunscreen. Do not use sun lamps, tanning beds, or tanning booths. This medication may cause thoughts of suicide or depression. This includes sudden changes in mood, behaviors, or thoughts. These changes can happen at any time but are more common in the beginning of treatment or after a change in dose. Call your care team right away if you experience these thoughts or worsening depression. Women who become pregnant while using this medication may enroll in the [place] Pregnancy Registry by calling [phone]. This registry collects information about the safety of antiepileptic medication use during pregnancy. This medication may cause a decrease in vitamin D and folic acid. You should make sure that you get enough  vitamins while you are taking this medication. Discuss the foods you eat and the vitamins you take with your care team. What side effects may I notice from receiving this medication? Side effects that you should report to your care team as soon as possible: Allergic reactions--skin rash, itching, hives, swelling of the face, lips, tongue, or throat Aplastic anemia--unusual weakness or fatigue, dizziness, headache, trouble breathing, increased bleeding or bruising Change in vision Heart rhythm changes--fast or irregular heartbeat, dizziness, feeling faint or lightheaded, chest pain, trouble breathing Infection--fever, chills, cough, or sore throat Liver injury--right upper belly pain, loss of appetite, nausea, light-colored stool, dark yellow or brown urine, yellowing skin or eyes, unusual weakness or fatigue Low sodium level--muscle weakness, fatigue, dizziness, headache, confusion Rash, fever, and swollen lymph nodes Redness, blistering, peeling or loosening of the skin, including inside the mouth Thoughts of suicide or self-harm, worsening mood, feelings of depression Side effects that usually do not require medical attention (report to your care team if they continue or are bothersome): Dizziness Drowsiness Loss of balance or coordination Nausea Vomiting This list may not describe all possible side effects. Call your doctor for medical advice about side effects. You may report side effects to FDA at 1-800-FDA-1088. Where should I keep my medication? Keep out of reach of children. Store at room temperature below 30 degrees C (86 degrees F). Keep container tightly closed. Protect from moisture. Throw away any unused medication after the expiration date. NOTE: This sheet is a summary. It may not cover all possible information. If you have questions about this medicine, talk to your doctor, pharmacist, or health care provider.  2023 Elsevier/Gold Standard (2022-05-01 00:00:00)

## 2022-12-12 NOTE — Progress Notes (Unsigned)
Aaron Molina MD OP Progress Note  12/12/2022 6:28 PM Aaron Nichols  MRN:  [id number]  Chief Complaint:  Chief Complaint  Patient presents with   Follow-up   Anxiety   Depression   Medication Refill   HPI: Aaron Nichols is a 52 year old Caucasian male who has applied for disability, married, lives in [place] has a history of MDD, GAD, insomnia, history of brain tumor, caustic neuroma in 2007, chronic face cramping, numbness, tingling in right foot, hemifacial paralysis, back pain, was evaluated in office today for a follow-up.  Patient today reports he continues to struggle with depression symptoms and has not noticed much benefit from the addition of BuSpar.  Continues to take a low dosage of Cymbalta.  Patient with elevated blood pressure reading again in session today has not followed up with primary care provider as recommended last visit.  Patient reports he has upcoming appointment and is motivated to keep it.  Since Cymbalta could also cause elevated blood pressure patient aware dosage cannot be increased.  Patient continues to report anxiety symptoms, nervousness, racing heart rate, shortness of breath, anxiety attacks which can last for 45 minutes or so 3-4 times a week.  Has not noticed much benefit from propranolol although he continues to use it as needed.  Patient denies any suicidality, homicidality or perceptual disturbances.  Continues to struggle with sleep.  Mostly because of pain.  Unable to find a comfortable position.  Pain continues to be a major issue.  Patient denies any other concerns today.  Visit Diagnosis:    ICD-10-CM   1. Current severe episode of major depressive disorder without psychotic features without prior episode (HCC)  F32.2 carbamazepine (TEGRETOL XR) 100 MG 12 hr tablet    busPIRone (BUSPAR) 10 MG tablet    Carbamazepine level, total    Platelet count    Hepatic function panel    Sodium    2. GAD (generalized anxiety disorder)  F41.1 Carbamazepine level,  total    Platelet count    Hepatic function panel    3. Insomnia due to medical condition  G47.01    pain    4. High risk medication use  Z79.899 Carbamazepine level, total    Platelet count    Hepatic function panel    Sodium      Past Psychiatric History: Reviewed past psychiatric history from progress note on 10/31/2022.  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 woke up before extubation with cystoscopy- pt not aware of this   GERD (gastroesophageal reflux disease)    Hydronephrosis with urinary obstruction due to ureteral calculus 07/03/2015   Hypertension    Infection of the upper respiratory tract 04/22/2015   Kidney stone 10/22/1988   PONV (postoperative nausea and vomiting)    Right ureteral stone 07/03/2015   Right-sided Bell's palsy 10/22/2006   nerve fatigue    Past Surgical History:  Procedure Laterality Date   BRAIN TUMOR EXCISION  2008   CHOLECYSTECTOMY  2022   CYSTOSCOPY W/ RETROGRADES Left 09/07/2015   Procedure: CYSTOSCOPY WITH RETROGRADE PYELOGRAM;  Surgeon: Nickie Retort, MD;  Location: [facility]  ORS;  Service: Urology;  Laterality: Left;   CYSTOSCOPY W/ RETROGRADES Left 10/03/2015   Procedure: CYSTOSCOPY WITH RETROGRADE PYELOGRAM;  Surgeon: Nickie Retort, MD;  Location: [facility] ORS;  Service: Urology;  Laterality: Left;   CYSTOSCOPY WITH STENT PLACEMENT Right 07/06/2015   Procedure: CYSTOSCOPY WITH STENT PLACEMENT;  Surgeon: Collier Flowers, MD;  Location: [facility] ORS;  Service: Urology;  Laterality: Right;   CYSTOSCOPY WITH STENT PLACEMENT Left 09/07/2015   Procedure: CYSTOSCOPY WITH STENT PLACEMENT;  Surgeon: Nickie Retort, MD;  Location: [facility] ORS;  Service: Urology;  Laterality: Left;   CYSTOSCOPY WITH STENT PLACEMENT Left 10/03/2015   Procedure: CYSTOSCOPY WITH STENT PLACEMENT/ EXCHANGE/STONE BASKETING;  Surgeon: Nickie Retort, MD;  Location: [facility] ORS;  Service: Urology;  Laterality: Left;   ELBOW FRACTURE SURGERY Left 10/22/1996   INCISION AND DRAINAGE ABSCESS  2022   pt states this abscess was between his scrotum and anus   LUMBAR LAMINECTOMY/DECOMPRESSION MICRODISCECTOMY N/A 01/11/2022   Procedure: Microlumbar decompression Lumbar three-four;  Surgeon: Susa Day, MD;  Location: [place];  Service: Orthopedics;  Laterality: N/A;   URETEROSCOPY WITH HOLMIUM LASER LITHOTRIPSY Right 07/06/2015   Procedure: URETEROSCOPY ;  Surgeon: Collier Flowers, MD;  Location: [facility] ORS;  Service: Urology;  Laterality: Right;   URETEROSCOPY WITH HOLMIUM LASER LITHOTRIPSY Right 07/25/2015   Procedure: URETEROSCOPY WITH HOLMIUM LASER LITHOTRIPSY, CYSTOSCOPY,  STENT REMOVAL ;  Surgeon: Collier Flowers, MD;  Location: [facility] ORS;  Service: Urology;  Laterality: Right;   URETEROSCOPY WITH HOLMIUM LASER LITHOTRIPSY Left 09/07/2015   Procedure: URETEROSCOPY;  Surgeon: Nickie Retort, MD;  Location: [facility] ORS;  Service: Urology;  Laterality: Left;   URETEROSCOPY WITH HOLMIUM LASER LITHOTRIPSY Left 10/03/2015   Procedure: URETEROSCOPY WITH HOLMIUM LASER LITHOTRIPSY;  Surgeon: Nickie Retort, MD;  Location: [facility] ORS;  Service: Urology;  Laterality: Left;    Family Psychiatric History: Reviewed family psychiatric history from progress note on 10/31/2022.  Family History:  Family History  Problem Relation Age of Onset   Schizophrenia Mother    Alcohol abuse Father    Heart disease Father    Drug abuse Daughter     Social History: Reviewed social history from progress note on 10/31/2022. Social History   Socioeconomic History   Marital status: Married    Spouse name: Not on file   Number of children: 1   Years of  education: Not on file   Highest education level: Some college, no degree  Occupational History   Not on file  Tobacco Use   Smoking status: Former    Types: Cigarettes    Quit date: 10/13/2021    Years since quitting: 1.1   Smokeless tobacco: Never  Vaping Use   Vaping Use: Never used  Substance and Sexual Activity   Alcohol use: No   Drug use: No   Sexual activity: Not Currently  Other Topics Concern   Not on file  Social History Narrative   Not on file   Social Determinants of Health   Financial Resource Strain: Not on file  Food Insecurity: Not on file  Transportation Needs: Not on file  Physical Activity: Not on file  Stress: Not on file  Social Connections: Not on file    Allergies:  Allergies  Allergen Reactions   Ceftin [Cefuroxime Axetil]     Dizziness,lightheaded    Metabolic Disorder Labs: No results found for: "HGBA1C", "MPG" No results found for: "PROLACTIN" No results found for: "CHOL", "TRIG", "HDL", "CHOLHDL", "VLDL", "  [place]" Lab Results  Component Value Date   TSH 1.925 11/26/2022    Therapeutic Level Labs: No results found for: "LITHIUM" No results found for: "VALPROATE" No results found for: "CBMZ"  Current Medications: Current Outpatient Medications  Medication Sig Dispense Refill   amLODipine (NORVASC) 5 MG tablet Take 5 mg by mouth daily.     atorvastatin (LIPITOR) 20 MG tablet Take 20 mg by mouth daily.     carbamazepine (TEGRETOL XR) 100 MG 12 hr tablet Take 1 tablet (100 mg total) by mouth 2 (two) times daily. 60 tablet 1   diphenhydrAMINE (BENADRYL) 25 mg capsule Take 25 mg by mouth every 6 (six) hours as needed.     DULoxetine (CYMBALTA) 20 MG capsule Take 20 mg by mouth daily.     famotidine (PEPCID) 20 MG tablet Take 20 mg by mouth daily.     oxyCODONE-acetaminophen (PERCOCET/ROXICET) 5-325 MG tablet Take by mouth 2 (two) times daily.     polyethylene glycol (MIRALAX / GLYCOLAX) 17 g packet Take 17 g by mouth daily. 14 each  0   propranolol (INDERAL) 10 MG tablet Take 1 tablet (10 mg total) by mouth 2 (two) times daily as needed. Severe anxiety attacks only 60 tablet 1   busPIRone (BUSPAR) 10 MG tablet Take 1 tablet (10 mg total) by mouth 2 (two) times daily. 60 tablet 1   No current facility-administered medications for this visit.     Musculoskeletal: Strength & Muscle Tone: within normal limits Gait & Station: normal Patient leans: N/A  Psychiatric Specialty Exam: Review of Systems  Musculoskeletal:  Positive for back pain.  Psychiatric/Behavioral:  Positive for decreased concentration, dysphoric mood and sleep disturbance. The patient is nervous/anxious.   All other systems reviewed and are negative.   Blood pressure (!) 165/82, pulse 80, temperature 98.3 F (36.8 C), temperature source Skin, height 5' 10"$  (1.778 m), weight 229 lb (103.9 kg).Body mass index is 32.86 kg/m.  General Appearance: Casual  Eye Contact:  Fair  Speech:  Clear and Coherent  Volume:  Normal  Mood:  Anxious and Depressed  Affect:  Congruent  Thought Process:  Goal Directed and Descriptions of Associations: Intact  Orientation:  Full (Time, Place, and Person)  Thought Content: Logical   Suicidal Thoughts:  No  Homicidal Thoughts:  No  Memory:  Immediate;   Fair Recent;   Fair Remote;   Fair  Judgement:  Fair  Insight:  Fair  Psychomotor Activity:  Normal  Concentration:  Concentration: Fair and Attention Span: Fair  Recall:  [employer] of Knowledge: Fair  Language: Fair  Akathisia:  No  Handed:  Right  AIMS (if indicated): not done  Assets:  Communication Skills Desire for Improvement Housing Social Support  ADL's:  Intact  Cognition: WNL  Sleep:  Poor   Screenings: GAD-7    Physiological scientist Office Visit from 12/12/2022 in [place] Office Visit from 10/31/2022 in [place]  Total GAD-7 Score 18 17      PHQ2-9     [place] Visit from 12/12/2022 in [place] Office Visit from 10/31/2022 in [place]  PHQ-2 Total Score 5 6  PHQ-9 Total Score 22 24      [place] Office Visit from 12/12/2022 in [place] Office Visit from 10/31/2022 in [place] Testing 60 from 01/01/2022 in [place]  [place] PREADMISSION TESTING  C-SSRS RISK CATEGORY No Risk Low Risk No Risk        Assessment and Plan: Aaron Nichols is a 52 year old Caucasian male who is married, currently lives in [place], has a history of MDD, GAD, chronic pain, back surgery, history of brain tumor, status post brain surgery, hemifacial paralysis, renal stone was evaluated in office today.  Patient continues to struggle with mood symptoms, sleep problems, exacerbated by his pain, will benefit from the following plan.  Plan MDD-unstable Continue BuSpar 10 mg p.o. twice daily Duloxetine 20 mg p.o. daily Start Tegretol extended release 100 mg p.o. twice daily for his mood as well as pain. Provided medication education including drug to drug interaction with other medications as well as the need for monitoring levels, liver function, renal function, CBC with differential while on this medication. Patient advised to follow up with therapist-communicated with staff to schedule this patient with a therapist.  GAD-unstable Continue BuSpar as noted above. Propranolol 10 mg p.o. twice daily as needed for anxiety attacks Tegretol extended release initiated as noted above.  Tegretol will also help with anxiety.  Insomnia-unstable mostly due to pain.   Patient will need sufficient pain management. Continue sleep hygiene techniques.  High risk medication use-will order Tegretol level since Tegretol could get toxic and levels needs to be monitored, LFT,  platelet count, sodium-patient to go to lab in a week after starting the medication.  Follow-up in clinic in 4 weeks or sooner if needed.  This note was generated in part or whole with voice recognition software. Voice recognition is usually quite accurate but there are transcription errors that can and very often do occur. I apologize for any typographical errors that were not detected and corrected.     Ursula Alert, MD 12/12/2022, 6:28 PM

## 2022-12-20 ENCOUNTER — Telehealth: Payer: Self-pay | Admitting: Psychiatry

## 2022-12-20 ENCOUNTER — Ambulatory Visit (INDEPENDENT_AMBULATORY_CARE_PROVIDER_SITE_OTHER): Payer: BC Managed Care – PPO | Admitting: Licensed Clinical Social Worker

## 2022-12-20 DIAGNOSIS — F411 Generalized anxiety disorder: Secondary | ICD-10-CM | POA: Diagnosis not present

## 2022-12-20 DIAGNOSIS — F322 Major depressive disorder, single episode, severe without psychotic features: Secondary | ICD-10-CM | POA: Diagnosis not present

## 2022-12-20 DIAGNOSIS — G4701 Insomnia due to medical condition: Secondary | ICD-10-CM | POA: Diagnosis not present

## 2022-12-20 NOTE — Telephone Encounter (Signed)
If new medication is causing side effects , I would advise to stop it.CMA Val to contact patient to discuss.  Will have Tina contact to schedule a sooner appointment if needed for further changes.
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
## 2022-12-20 NOTE — Telephone Encounter (Signed)
Patient came in for her therapy appointment. While here he states he started new medication, last week, he cannot remember name of it. It is causing dizziness, itching. Not sure if this is what has caused it but excessive since the is only thing new. Please advise.

## 2022-12-20 NOTE — Telephone Encounter (Signed)
Called patient no answer left voicemail for patient to return call to office

## 2022-12-21 ENCOUNTER — Telehealth: Payer: Self-pay

## 2022-12-21 NOTE — Telephone Encounter (Signed)
If he is discontinuing the medication he does not need to get the labs done.  Please let patient know.

## 2022-12-21 NOTE — Telephone Encounter (Signed)
Called patient to make aware that he did not need the labs done if he is discontinuing the medication he voiced understanding

## 2022-12-21 NOTE — Telephone Encounter (Signed)
Patient did return call back to office he stated that he has been taking the medication now for about a week after about 2-3 days of starting he started experiencing dizziness and itching he has to lay down to help with the dizziness he did not take it on yesterday due to having to drive to an appt., he did take the dose this morning he also mentioned lab work and would like to know if he still needs to have lab work done if he is going to discontinue the medication please advise.    carbamazepine (TEGRETOL XR) 100 MG 12 hr tablet

## 2023-01-03 ENCOUNTER — Other Ambulatory Visit: Payer: Self-pay | Admitting: Psychiatry

## 2023-01-03 DIAGNOSIS — F419 Anxiety disorder, unspecified: Secondary | ICD-10-CM

## 2023-01-09 ENCOUNTER — Ambulatory Visit (INDEPENDENT_AMBULATORY_CARE_PROVIDER_SITE_OTHER): Payer: BC Managed Care – PPO | Admitting: Licensed Clinical Social Worker

## 2023-01-09 DIAGNOSIS — F411 Generalized anxiety disorder: Secondary | ICD-10-CM

## 2023-01-09 DIAGNOSIS — F322 Major depressive disorder, single episode, severe without psychotic features: Secondary | ICD-10-CM | POA: Diagnosis not present

## 2023-01-17 ENCOUNTER — Other Ambulatory Visit: Payer: Self-pay | Admitting: Family Medicine

## 2023-01-17 DIAGNOSIS — R222 Localized swelling, mass and lump, trunk: Secondary | ICD-10-CM

## 2023-01-20 NOTE — Progress Notes (Signed)
THERAPIST PROGRESS NOTE  Session Time: 3:00PM-3:55PM  Participation Level: Active  Behavioral Response: Casual, Neat, and Well GroomedAlertDepressed  Type of Therapy: Individual Therapy  Treatment Goals addressed:  Report a decrease in anxiety symptoms as evidenced by an overall reduction in anxiety score by a minimum of 25% on the Generalized Anxiety Disorder Scale (GAD-7).   Reduce frequency, intensity, and duration of depression symptoms so that daily functioning is improved   ProgressTowards Goals: Progressing  Interventions: CBT, DBT, Solution Focused, Strength-based, and Other: ACT  Summary: Aaron Nichols is a 52 y.o. male who presents with mixed sxs of anxiety and depression including but not limited to lack of motivation, lack of interest, fatigue, depressed mood, worry, difficulty controlling worry, and negative self affect. Pt oriented to person, place, and time. Pt denies SI/HI or A/V hallucinations. Pt was cooperative during visit and was engaged throughout the visit. Pt does not report any other concerns at the time of visit.  Pt reported that he has been feeling isolated due to difficulty being around people when pt feels he is not himself. Pt engaged in comparison of his problems saying that he overcame a brain tumor, so why not this. Discussed difference in nature of problems and contributing factors. Pt reported feeling that his doctor gave up before they started.   Pt reported feeling that his wife is supportive and also expects what works for her to work for pt. Pt reported that he finds himself apologizing often for not being where his wife is. Practiced conversational skills to assist pt in engaging in gratitude and self advocacy. Discussed difference between when to use gratitude and when to use apologizing.   Pt reported that if he cannot complete a task he often does not start a task. Discussed consequences these actions may have. Discussed pt preferences on  helping vs not trying and pt reported desire to help. Assisted pt in exploring what tasks pt feels comfortable assisting with. Pt identified making tea, cooking, grilling, and beginning some cleaning tasks to be activities he feels he can help with.   Discussed importance of having a schedule and invited pt to establish a schedule for self going forward. Pt reported improvements in practicing DBT distress tolerance skill of opposite action when facing debilitating sxs. Pt identified how they discerned need to utilize opposite action and explored ways they can and have practiced opposite action to assist in combating depressive sxs.    LCSW provided mood monitoring and treatment progress review in the context of this episode of treatment. LCSW reviewed the pt's mood status since last session.   Pt is continuing to apply interventions/techniques learned in session into daily life situations. Pt is currently on track to meet goals utilizing interventions that are discussed in session. Treatment to continue as indicated. Personal growth and progress toward goals noted above.  Continued Recommendations as followed: Self-care behaviors, positive social engagements, focusing on positive physical and emotional wellness, and focusing on life/work balance.   Suicidal/Homicidal: Nowithout intent/plan  Therapist Response:  Provided pt education re: acceptance. Discussed how to make acceptance accessible at all parts of pt's healing journey.    Approached pt with strengths based perspective to assist pt in exploring strengths in moments of feeling low.    LCSW practiced active listening to validate pt participation, build rapport, and create safe space for pt to feel heard as they are disclosing their thoughts and feelings.   LCSW utilized therapeutic conversation skills informed by CBT, DBT, and  ACT to expose pt to multiple ways of thinking about healing and to provide pt to access to multiple  interventions.  LCSW introduced pt to action planning. Pt practiced naming long term goal and worked with LCSW to define measurable "mile marker" goals that feel achievable to herself as she is now to assist her in pursuing her long term goal.   LCSW introduced pt to Acceptance and Commitment Therapy. Pt engaged in discussion on how to explore what they must accept in order to commit to what they have identified as important. Introduced pt to values directed goal exploration in order to identify goals of importance.    Plan: Return again in 3 weeks.  Diagnosis: GAD (generalized anxiety disorder)  Current severe episode of major depressive disorder without psychotic features without prior episode ([place])     01/09/2023    4:38 PM 12/20/2022    2:44 PM 12/12/2022    3:50 PM  Depression screen PHQ 2/9  Decreased Interest 3 3 3   Down, Depressed, Hopeless 3 3 2   PHQ - 2 Score 6 6 5   Altered sleeping 3 3 3   Tired, decreased energy 3 3 3   Change in appetite 3 2 2   Feeling bad or failure about yourself  3 3 3   Trouble concentrating 2 3 3   Moving slowly or fidgety/restless 2 3 2   Suicidal thoughts 0 1 1  PHQ-9 Score 22 24 22   Difficult doing work/chores  Very difficult       01/09/2023    4:39 PM 12/20/2022    2:45 PM 12/12/2022    3:50 PM 10/31/2022    3:12 PM  GAD 7 : Generalized Anxiety Score  Nervous, Anxious, on Edge 3 3 3 2   Control/stop worrying 3 2 3 2   Worry too much - different things 3 2 2 3   Trouble relaxing 2 3 3 3   Restless 3 3 2 3   Easily annoyed or irritable 2 2 2 1   Afraid - awful might happen 3 2 3 3   Total GAD 7 Score 19 17 18 17   Anxiety Difficulty  Very difficult Very difficult Very difficult      Collaboration of Care: Psychiatrist AEB Dr. Shea Evans  Patient/Guardian was advised Release of Information must be obtained prior to any record release in order to collaborate their care with an outside provider. Patient/Guardian was advised if they have not already  done so to contact the registration department to sign all necessary forms in order for [place] to release information regarding their care.   Consent: Patient/Guardian gives verbal consent for treatment and assignment of benefits for services provided during this visit. Patient/Guardian expressed understanding and agreed to proceed.   Blair Dolphin, LCSW 01/20/2023

## 2023-01-28 ENCOUNTER — Ambulatory Visit (INDEPENDENT_AMBULATORY_CARE_PROVIDER_SITE_OTHER): Payer: BC Managed Care – PPO | Admitting: Licensed Clinical Social Worker

## 2023-01-28 DIAGNOSIS — Z91199 Patient's noncompliance with other medical treatment and regimen due to unspecified reason: Secondary | ICD-10-CM
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
## 2023-01-30 ENCOUNTER — Encounter: Payer: Self-pay | Admitting: Psychiatry

## 2023-01-30 ENCOUNTER — Ambulatory Visit (INDEPENDENT_AMBULATORY_CARE_PROVIDER_SITE_OTHER): Payer: BC Managed Care – PPO | Admitting: Psychiatry

## 2023-01-30 VITALS — BP 147/86 | HR 76 | Temp 97.7°F | Ht 70.0 in | Wt 230.4 lb

## 2023-01-30 DIAGNOSIS — G4701 Insomnia due to medical condition: Secondary | ICD-10-CM

## 2023-01-30 DIAGNOSIS — F411 Generalized anxiety disorder: Secondary | ICD-10-CM

## 2023-01-30 DIAGNOSIS — Z79899 Other long term (current) drug therapy: Secondary | ICD-10-CM

## 2023-01-30 DIAGNOSIS — F322 Major depressive disorder, single episode, severe without psychotic features: Secondary | ICD-10-CM | POA: Diagnosis not present

## 2023-01-30 MED ORDER — SERTRALINE HCL 25 MG PO TABS: 25.0000 mg | ORAL_TABLET | Freq: Every day | ORAL | 0 refills | Status: DC

## 2023-01-30 MED ORDER — SERTRALINE HCL 50 MG PO TABS: 50.0000 mg | ORAL_TABLET | Freq: Every day | ORAL | 0 refills | Status: DC

## 2023-01-30 NOTE — Progress Notes (Signed)
BH MD OP Progress Note  01/30/2023 6:06 PM Aaron Nichols  MRN:  [id number]  Chief Complaint:  Chief Complaint  Patient presents with   Follow-up   Anxiety   Depression   Medication Refill   HPI: Aaron Nichols is a 52 year old Caucasian male who has applied for disability, married, lives in [place] has a history of MDD, GAD, insomnia, history of brain tumor, Acoustic neuroma in 2007, chronic face cramping, numbness, tingling in right foot, hemifacial paralysis, back pain was evaluated in office today.  Patient today reports he continues to struggle with depression and anxiety symptoms.  Patient reports sadness, concentration problem, feeling nervous, racing heart rate, anxiety attacks ongoing with not much difference since his last visit.  He did not tolerate the Tegretol and hence stopped taking it.  Patient also reports he may not have been very compliant with the BuSpar, did not read the instructions on the bottle well and has been using it as needed.  Patient continues to struggle with sleep.  Pain continues to be a problem and is currently trying to find a pain provider. Patient was recently started on Lyrica for pain by primary care provider.  Not sure if this is beneficial or not.  Patient denies any suicidality, homicidality or perceptual disturbances.  Reports he missed the appointment with therapist since he was not feeling well that day.  Patient denies any other concerns today.      Visit Diagnosis:    ICD-10-CM   1. Current severe episode of major depressive disorder without psychotic features without prior episode  F32.2 sertraline (ZOLOFT) 25 MG tablet    sertraline (ZOLOFT) 50 MG tablet    2. GAD (generalized anxiety disorder)  F41.1 sertraline (ZOLOFT) 25 MG tablet    sertraline (ZOLOFT) 50 MG tablet    3. Insomnia due to medical condition  G47.01    Pain    4. High risk medication use  Z79.899       Past Psychiatric History: I have reviewed past psychiatric  history from progress note on 10/31/2022.  Past trials of Cymbalta.  Past Medical History:  Past Medical History:  Diagnosis Date   Abnormal LFTs (liver function tests)    Arthritis    BP (high blood pressure) 01/26/2015   Brain tumor    acoustic neuroma   Complication of anesthesia 10/22/2006   brain tumor 15 hours surgery resulting nausea and vomiting for severeal days   Complication of anesthesia    pt. woke up before extubation with cystoscopy- pt not aware of this   GERD (gastroesophageal reflux disease)    Hydronephrosis with urinary obstruction due to ureteral calculus 07/03/2015   Hypertension    Infection of the upper respiratory tract 04/22/2015   Kidney stone 10/22/1988   PONV (postoperative nausea and vomiting)    Right ureteral stone 07/03/2015   Right-sided Bell's palsy 10/22/2006   nerve fatigue    Past Surgical History:  Procedure Laterality Date   BRAIN TUMOR EXCISION  2008   CHOLECYSTECTOMY  2022   CYSTOSCOPY W/ RETROGRADES Left 09/07/2015   Procedure: CYSTOSCOPY WITH RETROGRADE PYELOGRAM;  Surgeon: Hildred Laser, MD;  Location: [facility];  Service: Urology;  Laterality: Left;   CYSTOSCOPY W/ RETROGRADES Left 10/03/2015   Procedure: CYSTOSCOPY WITH RETROGRADE PYELOGRAM;  Surgeon: Hildred Laser, MD;  Location: [facility];  Service: Urology;  Laterality: Left;   CYSTOSCOPY WITH STENT PLACEMENT Right 07/06/2015   Procedure: CYSTOSCOPY WITH STENT PLACEMENT;  Surgeon: Caralyn Guile  Edwyna ShellHart, MD;  Location: [facility];  Service: Urology;  Laterality: Right;   CYSTOSCOPY WITH STENT PLACEMENT Left 09/07/2015   Procedure: CYSTOSCOPY WITH STENT PLACEMENT;  Surgeon: Hildred LaserBrian James Budzyn, MD;  Location: [facility];  Service: Urology;  Laterality: Left;   CYSTOSCOPY WITH STENT PLACEMENT Left 10/03/2015   Procedure: CYSTOSCOPY WITH STENT PLACEMENT/ EXCHANGE/STONE BASKETING;  Surgeon: Hildred LaserBrian James Budzyn, MD;  Location: [facility];  Service: Urology;  Laterality: Left;   ELBOW FRACTURE  SURGERY Left 10/22/1996   INCISION AND DRAINAGE ABSCESS  2022   pt states this abscess was between his scrotum and anus   LUMBAR LAMINECTOMY/DECOMPRESSION MICRODISCECTOMY N/A 01/11/2022   Procedure: Microlumbar decompression Lumbar three-four;  Surgeon: Jene EveryBeane, Jeffrey, MD;  [facility];  Service: Orthopedics;  Laterality: N/A;   URETEROSCOPY WITH HOLMIUM LASER LITHOTRIPSY Right 07/06/2015   Procedure: URETEROSCOPY ;  Surgeon: Lorraine Laxichard D Hart, MD;  Location: [facility];  Service: Urology;  Laterality: Right;   URETEROSCOPY WITH HOLMIUM LASER LITHOTRIPSY Right 07/25/2015   Procedure: URETEROSCOPY WITH HOLMIUM LASER LITHOTRIPSY, CYSTOSCOPY,  STENT REMOVAL ;  Surgeon: Lorraine Laxichard D Hart, MD;  Location: [facility];  Service: Urology;  Laterality: Right;   URETEROSCOPY WITH HOLMIUM LASER LITHOTRIPSY Left 09/07/2015   Procedure: URETEROSCOPY;  Surgeon: Hildred LaserBrian James Budzyn, MD;  Location: [facility];  Service: Urology;  Laterality: Left;   URETEROSCOPY WITH HOLMIUM LASER LITHOTRIPSY Left 10/03/2015   Procedure: URETEROSCOPY WITH HOLMIUM LASER LITHOTRIPSY;  Surgeon: Hildred LaserBrian James Budzyn, MD;  Location: [facility];  Service: Urology;  Laterality: Left;    Family Psychiatric History: I have reviewed family psychiatric history from progress note on 10/31/2022.  Family History:  Family History  Problem Relation Age of Onset   Schizophrenia Mother    Alcohol abuse Father    Heart disease Father    Drug abuse Daughter     Social History: I have reviewed social history from progress note on 10/31/2022. Social History   Socioeconomic History   Marital status: Married    Spouse name: Not on file   Number of children: 1   Years of education: Not on file   Highest education level: Some college, no degree  Occupational History   Not on file  Tobacco Use   Smoking status: Former    Types: Cigarettes    Quit date: 10/13/2021    Years since quitting: 1.3   Smokeless tobacco: Never  Vaping Use   Vaping Use:  Never used  Substance and Sexual Activity   Alcohol use: No   Drug use: No   Sexual activity: Not Currently  Other Topics Concern   Not on file  Social History Narrative   Not on file   Social Determinants of Health   Financial Resource Strain: Not on file  Food Insecurity: Not on file  Transportation Needs: Not on file  Physical Activity: Not on file  Stress: Not on file  Social Connections: Not on file    Allergies:  Allergies  Allergen Reactions   Ceftin [Cefuroxime Axetil]     Dizziness,lightheaded    Metabolic Disorder Labs: No results found for: "HGBA1C", "MPG" No results found for: "PROLACTIN" No results found for: "CHOL", "TRIG", "HDL", "CHOLHDL", "VLDL", "LDLCALC" Lab Results  Component Value Date   TSH 1.925 11/26/2022    Therapeutic Level Labs: No results found for: "LITHIUM" No results found for: "VALPROATE" No results found for: "CBMZ"  Current Medications: Current Outpatient Medications  Medication Sig Dispense Refill   amLODipine (NORVASC) 5 MG tablet Take  5 mg by mouth daily.     atorvastatin (LIPITOR) 20 MG tablet Take 20 mg by mouth daily.     busPIRone (BUSPAR) 10 MG tablet Take 1 tablet (10 mg total) by mouth 2 (two) times daily. 60 tablet 1   famotidine (PEPCID) 20 MG tablet Take 20 mg by mouth daily.     levocetirizine (XYZAL) 5 MG tablet Take 5 mg by mouth every evening.     lisinopril (ZESTRIL) 10 MG tablet Take 10 mg by mouth daily.     oxyCODONE (OXY IR/ROXICODONE) 5 MG immediate release tablet TAKE 1 TABLET BY MOUTH EVERY 4 HOURS     oxyCODONE-acetaminophen (PERCOCET/ROXICET) 5-325 MG tablet Take by mouth 2 (two) times daily.     polyethylene glycol (MIRALAX / GLYCOLAX) 17 g packet Take 17 g by mouth daily. 14 each 0   pregabalin (LYRICA) 25 MG capsule Take 25 mg by mouth once.     propranolol (INDERAL) 10 MG tablet TAKE 1 TABLET(10 MG) BY MOUTH TWICE DAILY AS NEEDED FOR SEVERE ANXIETY 60 tablet 1   sertraline (ZOLOFT) 25 MG tablet  Take 1 tablet (25 mg total) by mouth daily with breakfast for 28 days. 28 tablet 0   [START ON 02/27/2023] sertraline (ZOLOFT) 50 MG tablet Take 1 tablet (50 mg total) by mouth daily with breakfast. Start taking after stopping sertraline 25 mg for 4 weeks . 30 tablet 0   No current facility-administered medications for this visit.     Musculoskeletal: Strength & Muscle Tone: within normal limits Gait & Station:  walks with cane Patient leans: N/A  Psychiatric Specialty Exam: Review of Systems  Musculoskeletal:  Positive for back pain.  Psychiatric/Behavioral:  Positive for dysphoric mood and sleep disturbance. The patient is nervous/anxious.   All other systems reviewed and are negative.   Blood pressure (!) 147/86, pulse 76, temperature 97.7 F (36.5 C), temperature source Skin, height 5\' 10"  (1.778 m), weight 230 lb 6.4 oz (104.5 kg).Body mass index is 33.06 kg/m.  General Appearance: Casual  Eye Contact:  Fair  Speech:  Clear and Coherent  Volume:  Normal  Mood:  Anxious and Depressed  Affect:  Congruent  Thought Process:  Goal Directed and Descriptions of Associations: Intact  Orientation:  Full (Time, Place, and Person)  Thought Content: Logical   Suicidal Thoughts:  No  Homicidal Thoughts:  No  Memory:  Immediate;   Fair Recent;   Fair Remote;   Fair  Judgement:  Fair  Insight:  Fair  Psychomotor Activity:  Normal  Concentration:  Concentration: Fair and Attention Span: Fair  Recall:  [employer] of Knowledge: Fair  Language: Fair  Akathisia:  No  Handed:  Left  AIMS (if indicated): not done  Assets:  Communication Skills Desire for Improvement Housing Social Support  ADL's:  Intact  Cognition: WNL  Sleep:  Poor   Screenings: GAD-7    Loss adjuster, chartered Office Visit from 01/30/2023 in [facility] Counselor from 01/09/2023 in [facility] Counselor from 12/20/2022 in [facility] Office Visit from 12/12/2022 in [facility] Office Visit from 10/31/2022 in [facility]  Total GAD-7 Score 19 19 17 18 17       PHQ2-9    Flowsheet Row Office Visit from 01/30/2023 in [facility] Counselor from 01/09/2023 in [facility] Counselor from 12/20/2022 in  [facility] Office Visit from 12/12/2022 in [facility] Office Visit from 10/31/2022 in [facility]  PHQ-2 Total Score 6 6 6 5 6   PHQ-9 Total Score 24 22 24 22 24       Flowsheet Row Office Visit from 01/30/2023 in [facility] Counselor from 01/09/2023 in [facility] Counselor from 12/20/2022 in [facility]  C-SSRS RISK CATEGORY No Risk Error: Q3, 4, or 5 should not be populated when Q2 is No Error: Q3, 4, or 5 should not be populated when Q2 is No        Assessment and Plan: Aaron Nichols is a 52 year old Caucasian male who is married, currently lives in [place], has a history of MDD, GAD, chronic pain, back surgery, history of brain tumor status post brain surgery, hemifacial paralysis, renal stone was evaluated in office today.  Patient is currently struggling with mood symptoms, sleep problems, exacerbated by pain, recent adverse effects to trial of carbamazepine.  Plan as noted below.  Plan  MDD-unstable BuSpar 10 mg p.o. twice daily Discontinue Tegretol for side effects. Discontinue duloxetine for lack of benefit. Start Zoloft 25 mg p.o. daily for 4 weeks and increase to Zoloft 50 mg p.o. daily after that.  Patient advised to take the Zoloft with breakfast.  Provided medication education including GI  side effects, serotonin syndrome.  GAD-unstable Continue CBT BuSpar as prescribed Propranolol 10 mg p.o. twice daily as needed Encouraged compliance  Insomnia-mostly due to pain-unstable Will refer patient for pain management. Patient is also on Lyrica and oxycodone at this time. Will consider adding a sleep medication in the future.  High risk medication use-will order platelet count, sodium, patient to get it after a week of starting sertraline.  Order is in the system.  Patient to go to [facility] lab.  Follow-up in clinic in 6 weeks or sooner if needed. Collaboration of Care: Collaboration of Care: Other I have reviewed notes per Ms. Primus Bravo -dated most recent 01/09/2023-have coordinated care.  Patient/Guardian was advised Release of Information must be obtained prior to any record release in order to collaborate their care with an outside provider. Patient/Guardian was advised if they have not already done so to contact the registration department to sign all necessary forms in order for [place] to release information regarding their care.   Consent: Patient/Guardian gives verbal consent for treatment and assignment of benefits for services provided during this visit. Patient/Guardian expressed understanding and agreed to proceed.   This note was generated in part or whole with voice recognition software. Voice recognition is usually quite accurate but there are transcription errors that can and very often do occur. I apologize for any typographical errors that were not detected and corrected.    Jomarie Longs, MD 02/01/2023, 8:59 AM

## 2023-01-30 NOTE — Patient Instructions (Signed)
Sertraline Tablets What is this medication? SERTRALINE (SER tra leen) treats depression, anxiety, obsessive-compulsive disorder (OCD), post-traumatic stress disorder (PTSD), and premenstrual dysphoric disorder (PMDD). It increases the amount of serotonin in the brain, a hormone that helps regulate mood. It belongs to a group of medications called SSRIs. This medicine may be used for other purposes; ask your health care provider or pharmacist if you have questions. COMMON BRAND NAME(S): Zoloft What should I tell my care team before I take this medication? They need to know if you have any of these conditions: Bleeding disorders Bipolar disorder or a family history of bipolar disorder Frequently drink alcohol Glaucoma Heart disease High blood pressure History of irregular heartbeat History of low levels of calcium, magnesium, or potassium in the blood Liver disease Receiving electroconvulsive therapy Seizures Suicidal thoughts, plans, or attempt by you or a family member Take medications that prevent or treat blood clots Thyroid disease An unusual or allergic reaction to sertraline, other medications, foods, dyes, or preservatives Pregnant or trying to get pregnant Breastfeeding How should I use this medication? Take this medication by mouth with a glass of water. Take it as directed on the prescription label at the same time every day. You can take it with or without food. If it upsets your stomach, take it with food. Do not take your medication more often than directed. Keep taking this medication unless your care team tells you to stop. Stopping it too quickly can cause serious side effects. It can also make your condition worse. A special MedGuide will be given to you by the pharmacist with each prescription and refill. Be sure to read this information carefully each time. Talk to your care team about the use of this medication in children. While it may be prescribed for children as  young as 7 years for selected conditions, precautions do apply. Overdosage: If you think you have taken too much of this medicine contact a poison control center or emergency room at once. NOTE: This medicine is only for you. Do not share this medicine with others. What if I miss a dose? If you miss a dose, take it as soon as you can. If it is almost time for your next dose, take only that dose. Do not take double or extra doses. What may interact with this medication? Do not take this medication with any of the following: Cisapride Dronedarone Linezolid MAOIs, such as Carbex, Eldepryl, Marplan, Nardil, and Parnate Methylene blue (injected into a vein) Pimozide Thioridazine This medication may also interact with the following: Alcohol Amphetamines Aspirin and aspirin-like medications Certain medications for fungal infections, such as ketoconazole, fluconazole, posaconazole, itraconazole Certain medications for irregular heart beat, such as flecainide, quinidine, propafenone Certain medications for mental health conditions Certain medications for migraine headaches, such as almotriptan, eletriptan, frovatriptan, naratriptan, rizatriptan, sumatriptan, zolmitriptan Certain medications for seizures, such as carbamazepine, valproic acid, phenytoin Certain medications for sleep Certain medications that prevent or treat blood clots, such as warfarin, enoxaparin, dalteparin Cimetidine Digoxin Diuretics Fentanyl Isoniazid Lithium NSAIDs, medications for pain and inflammation, such as ibuprofen or naproxen Other medications that cause heart rhythm changes, such as dofetilide Rasagiline Safinamide Supplements, such as St. Malcolm's wort, kava kava, valerian Tolbutamide Tramadol Tryptophan This list may not describe all possible interactions. Give your health care provider a list of all the medicines, herbs, non-prescription drugs, or dietary supplements you use. Also tell them if you smoke,  drink alcohol, or use illegal drugs. Some items may interact with your medicine.   What should I watch for while using this medication? Tell your care team if your symptoms do not get better or if they get worse. Visit your care team for regular checks on your progress. Because it may take several weeks to see the full effects of this medication, it is important to continue your treatment as prescribed by your care team. Patients and their families should watch out for new or worsening thoughts of suicide or depression. Also watch out for sudden changes in feelings such as feeling anxious, agitated, panicky, irritable, hostile, aggressive, impulsive, severely restless, overly excited and hyperactive, or not being able to sleep. If this happens, especially at the beginning of treatment or after a change in dose, call your care team. This medication may affect your coordination, reaction time, or judgment. Do not drive or operate machinery until you know how this medication affects you. Sit or stand up slowly to reduce the risk of dizzy or fainting spells. Drinking alcohol with this medication can increase the risk of these side effects. Your mouth may get dry. Chewing sugarless gum or sucking hard candy, and drinking plenty of water may help. Contact your care team if the problem does not go away or is severe. What side effects may I notice from receiving this medication? Side effects that you should report to your care team as soon as possible: Allergic reactions--skin rash, itching, hives, swelling of the face, lips, tongue, or throat Bleeding--bloody or black, tar-like stools, red or dark brown urine, vomiting blood or brown material that looks like coffee grounds, small red or purple spots on skin, unusual bleeding or bruising Heart rhythm changes--fast or irregular heartbeat, dizziness, feeling faint or lightheaded, chest pain, trouble breathing Low sodium level--muscle weakness, fatigue, dizziness,  headache, confusion Serotonin syndrome--irritability, confusion, fast or irregular heartbeat, muscle stiffness, twitching muscles, sweating, high fever, seizure, chills, vomiting, diarrhea Sudden eye pain or change in vision such as blurred vision, seeing halos around lights, vision loss Thoughts of suicide or self-harm, worsening mood Side effects that usually do not require medical attention (report these to your care team if they continue or are bothersome): Change in sex drive or performance Diarrhea Excessive sweating Nausea Tremors or shaking Upset stomach This list may not describe all possible side effects. Call your doctor for medical advice about side effects. You may report side effects to FDA at 1-800-FDA-1088. Where should I keep my medication? Keep out of the reach of children and pets. Store at room temperature between 20 and 25 degrees C (68 and 77 degrees F). Get rid of any unused medication after the expiration date. To get rid of medications that are no longer needed or expired: Take the medication to a medication take-back program. Check with your pharmacy or law enforcement to find a location. If you cannot return the medication, check the label or package insert to see if the medication should be thrown out in the garbage or flushed down the toilet. If you are not sure, ask your care team. If it is safe to put in the trash, empty the medication out of the container. Mix the medication with cat litter, dirt, coffee grounds, or other unwanted substance. Seal the mixture in a bag or container. Put it in the trash. NOTE: This sheet is a summary. It may not cover all possible information. If you have questions about this medicine, talk to your doctor, pharmacist, or health care provider.  2023 Elsevier/Gold Standard (2020-11-04 00:00:00)  

## 2023-02-20 ENCOUNTER — Ambulatory Visit: Payer: BC Managed Care – PPO | Admitting: Licensed Clinical Social Worker

## 2023-03-01 ENCOUNTER — Other Ambulatory Visit: Payer: Self-pay | Admitting: Psychiatry

## 2023-03-01 DIAGNOSIS — F322 Major depressive disorder, single episode, severe without psychotic features: Secondary | ICD-10-CM

## 2023-03-13 ENCOUNTER — Ambulatory Visit (INDEPENDENT_AMBULATORY_CARE_PROVIDER_SITE_OTHER): Payer: BC Managed Care – PPO | Admitting: Licensed Clinical Social Worker

## 2023-03-13 DIAGNOSIS — F411 Generalized anxiety disorder: Secondary | ICD-10-CM

## 2023-03-19 ENCOUNTER — Telehealth (INDEPENDENT_AMBULATORY_CARE_PROVIDER_SITE_OTHER): Payer: BC Managed Care – PPO | Admitting: Psychiatry

## 2023-03-19 ENCOUNTER — Encounter: Payer: Self-pay | Admitting: Psychiatry

## 2023-03-19 DIAGNOSIS — F322 Major depressive disorder, single episode, severe without psychotic features: Secondary | ICD-10-CM

## 2023-03-19 DIAGNOSIS — Z79899 Other long term (current) drug therapy: Secondary | ICD-10-CM

## 2023-03-19 DIAGNOSIS — G4701 Insomnia due to medical condition: Secondary | ICD-10-CM | POA: Diagnosis not present

## 2023-03-19 DIAGNOSIS — F411 Generalized anxiety disorder: Secondary | ICD-10-CM

## 2023-03-19 MED ORDER — SERTRALINE HCL 100 MG PO TABS: 100.0000 mg | ORAL_TABLET | Freq: Every day | ORAL | 0 refills | Status: DC

## 2023-03-19 NOTE — Progress Notes (Unsigned)
Virtual Visit via Video Note  I connected with Aaron Nichols on 03/19/23 at  2:30 PM EDT by a video enabled telemedicine application and verified that I am speaking with the correct person using two identifiers.  Location Provider Location : Remote office Patient Location : Home  Participants: Patient , Provider    I discussed the limitations of evaluation and management by telemedicine and the availability of in person appointments. The patient expressed understanding and agreed to proceed.    I discussed the assessment and treatment plan with the patient. The patient was provided an opportunity to ask questions and all were answered. The patient agreed with the plan and demonstrated an understanding of the instructions.   The patient was advised to call back or seek an in-person evaluation if the symptoms worsen or if the condition fails to improve as anticipated.    [facility] MD OP Progress Note  03/20/2023 4:27 PM KALON WHISENHUNT  MRN:  [id number]  Chief Complaint:  Chief Complaint  Patient presents with   Anxiety   Depression   Medication Refill   Follow-up   HPI: Aaron Nichols is a 52 year old Caucasian male who has applied for disability, married, lives in [place], has a history of MDD, GAD, insomnia, history of brain tumor, acoustic neuroma, chronic face cramping, numbness, tingling in right foot, hemifacial paralysis, back pain was evaluated by telemedicine today.  Patient today reports he is currently compliant on the Zoloft.  Currently taking Zoloft 50 mg daily.  Denies side effects.  Patient however reports although anxiety may have improved he continues to struggle with depression symptoms.  He reports his depression symptoms as sadness, low motivation, low energy.  Patient also reports that he is currently struggling with sleep issues.  He had COVID-19 infection couple of months ago.  He struggled with excessive sleepiness around that time.  However currently struggles with sleep  problems at night.  Sleep is restless.  Patient also struggles with a lot of pain and is currently on oxycodone and pregabalin.  Patient continues to follow-up with his therapist Ms. [facility].  Therapy sessions are beneficial.  Reports he had his disability evaluation completed recently.  Currently report pending.  Patient denies any suicidality, homicidality or perceptual disturbances.  Patient denies any other concerns today.  Visit Diagnosis:    ICD-10-CM   1. Current severe episode of major depressive disorder without psychotic features without prior episode (HCC)  F32.2 sertraline (ZOLOFT) 100 MG tablet    2. GAD (generalized anxiety disorder)  F41.1 sertraline (ZOLOFT) 100 MG tablet    3. Insomnia due to medical condition  G47.01    Pain    4. High risk medication use  Z79.899 Sodium      Past Psychiatric History: I have reviewed past psychiatric history from progress note on 10/31/2022.  Past trials of Cymbalta.  Past Medical History:  Past Medical History:  Diagnosis Date   Abnormal LFTs (liver function tests)    Arthritis    BP (high blood pressure) 01/26/2015   Brain tumor (HCC)    acoustic neuroma   Complication of anesthesia 10/22/2006   brain tumor 15 hours surgery resulting nausea and vomiting for severeal days   Complication of anesthesia    pt. woke up before extubation with cystoscopy- pt not aware of this   GERD (gastroesophageal reflux disease)    Hydronephrosis with urinary obstruction due to ureteral calculus 07/03/2015   Hypertension    Infection of the upper respiratory  tract 04/22/2015   Kidney stone 10/22/1988   PONV (postoperative nausea and vomiting)    Right ureteral stone 07/03/2015   Right-sided Bell's palsy 10/22/2006   nerve fatigue    Past Surgical History:  Procedure Laterality Date   BRAIN TUMOR EXCISION  2008   CHOLECYSTECTOMY  2022   CYSTOSCOPY W/ RETROGRADES Left 09/07/2015   Procedure: CYSTOSCOPY WITH RETROGRADE  PYELOGRAM;  Surgeon: Hildred Laser, MD;  Location: [facility];  Service: Urology;  Laterality: Left;   CYSTOSCOPY W/ RETROGRADES Left 10/03/2015   Procedure: CYSTOSCOPY WITH RETROGRADE PYELOGRAM;  Surgeon: Hildred Laser, MD;  Location: [facility];  Service: Urology;  Laterality: Left;   CYSTOSCOPY WITH STENT PLACEMENT Right 07/06/2015   Procedure: CYSTOSCOPY WITH STENT PLACEMENT;  Surgeon: Lorraine Lax, MD;  Location: [facility];  Service: Urology;  Laterality: Right;   CYSTOSCOPY WITH STENT PLACEMENT Left 09/07/2015   Procedure: CYSTOSCOPY WITH STENT PLACEMENT;  Surgeon: Hildred Laser, MD;  Location: [facility];  Service: Urology;  Laterality: Left;   CYSTOSCOPY WITH STENT PLACEMENT Left 10/03/2015   Procedure: CYSTOSCOPY WITH STENT PLACEMENT/ EXCHANGE/STONE BASKETING;  Surgeon: Hildred Laser, MD;  Location: [facility];  Service: Urology;  Laterality: Left;   ELBOW FRACTURE SURGERY Left 10/22/1996   INCISION AND DRAINAGE ABSCESS  2022   pt states this abscess was between his scrotum and anus   LUMBAR LAMINECTOMY/DECOMPRESSION MICRODISCECTOMY N/A 01/11/2022   Procedure: Microlumbar decompression Lumbar three-four;  Surgeon: Jene Every, MD;  Location: [facility];  Service: Orthopedics;  Laterality: N/A;   URETEROSCOPY WITH HOLMIUM LASER LITHOTRIPSY Right 07/06/2015   Procedure: URETEROSCOPY ;  Surgeon: Lorraine Lax, MD;  Location: [facility];  Service: Urology;  Laterality: Right;   URETEROSCOPY WITH HOLMIUM LASER LITHOTRIPSY Right 07/25/2015   Procedure: URETEROSCOPY WITH HOLMIUM LASER LITHOTRIPSY, CYSTOSCOPY,  STENT REMOVAL ;  Surgeon: Lorraine Lax, MD;  Location: [facility];  Service: Urology;  Laterality: Right;   URETEROSCOPY WITH HOLMIUM LASER LITHOTRIPSY Left 09/07/2015   Procedure: URETEROSCOPY;  Surgeon: Hildred Laser, MD;  Location: [facility];  Service: Urology;  Laterality: Left;   URETEROSCOPY WITH HOLMIUM LASER LITHOTRIPSY Left 10/03/2015   Procedure: URETEROSCOPY WITH  HOLMIUM LASER LITHOTRIPSY;  Surgeon: Hildred Laser, MD;  Location: [facility];  Service: Urology;  Laterality: Left;    Family Psychiatric History: I have reviewed family psychiatric history from progress note on 10/31/2022.  Family History:  Family History  Problem Relation Age of Onset   Schizophrenia Mother    Alcohol abuse Father    Heart disease Father    Drug abuse Daughter     Social History: I have reviewed social history from progress note on 10/31/2022. Social History   Socioeconomic History   Marital status: Married    Spouse name: Not on file   Number of children: 1   Years of education: Not on file   Highest education level: Some college, no degree  Occupational History   Not on file  Tobacco Use   Smoking status: Former    Types: Cigarettes    Quit date: 10/13/2021    Years since quitting: 1.4   Smokeless tobacco: Never  Vaping Use   Vaping Use: Never used  Substance and Sexual Activity   Alcohol use: No   Drug use: No   Sexual activity: Not Currently  Other Topics Concern   Not on file  Social History Narrative   Not on file   Social Determinants of Health  Financial Resource Strain: Not on file  Food Insecurity: Not on file  Transportation Needs: Not on file  Physical Activity: Not on file  Stress: Not on file  Social Connections: Not on file    Allergies:  Allergies  Allergen Reactions   Ceftin [Cefuroxime Axetil]     Dizziness,lightheaded    Metabolic Disorder Labs: No results found for: "HGBA1C", "MPG" No results found for: "PROLACTIN" No results found for: "CHOL", "TRIG", "HDL", "CHOLHDL", "VLDL", "LDLCALC" Lab Results  Component Value Date   TSH 1.925 11/26/2022    Therapeutic Level Labs: No results found for: "LITHIUM" No results found for: "VALPROATE" No results found for: "CBMZ"  Current Medications: Current Outpatient Medications  Medication Sig Dispense Refill   sertraline (ZOLOFT) 100 MG tablet Take 1 tablet (100  mg total) by mouth daily. 90 tablet 0   amLODipine (NORVASC) 5 MG tablet Take 5 mg by mouth daily.     atorvastatin (LIPITOR) 20 MG tablet Take 20 mg by mouth daily.     busPIRone (BUSPAR) 10 MG tablet TAKE 1 TABLET(10 MG) BY MOUTH TWICE DAILY 60 tablet 2   famotidine (PEPCID) 20 MG tablet Take 20 mg by mouth daily.     levocetirizine (XYZAL) 5 MG tablet Take 5 mg by mouth every evening.     lisinopril (ZESTRIL) 10 MG tablet Take 10 mg by mouth daily.     oxyCODONE (OXY IR/ROXICODONE) 5 MG immediate release tablet TAKE 1 TABLET BY MOUTH EVERY 4 HOURS     oxyCODONE-acetaminophen (PERCOCET/ROXICET) 5-325 MG tablet Take by mouth 2 (two) times daily.     polyethylene glycol (MIRALAX / GLYCOLAX) 17 g packet Take 17 g by mouth daily. 14 each 0   pregabalin (LYRICA) 25 MG capsule Take 25 mg by mouth once.     propranolol (INDERAL) 10 MG tablet TAKE 1 TABLET(10 MG) BY MOUTH TWICE DAILY AS NEEDED FOR SEVERE ANXIETY 60 tablet 1   No current facility-administered medications for this visit.     Musculoskeletal: Strength & Muscle Tone:  UTA Gait & Station:  Seated Patient leans: N/A  Psychiatric Specialty Exam: Review of Systems  Psychiatric/Behavioral:  Positive for dysphoric mood. The patient is nervous/anxious.     There were no vitals taken for this visit.There is no height or weight on file to calculate BMI.  General Appearance: Casual  Eye Contact:  Fair  Speech:  Clear and Coherent  Volume:  Normal  Mood:  Anxious and Depressed  Affect:  Appropriate  Thought Process:  Goal Directed and Descriptions of Associations: Intact  Orientation:  Full (Time, Place, and Person)  Thought Content: Logical   Suicidal Thoughts:  No  Homicidal Thoughts:  No  Memory:  Immediate;   Fair Recent;   Fair Remote;   Fair  Judgement:  Fair  Insight:  Fair  Psychomotor Activity:  Normal  Concentration:  Concentration: Fair and Attention Span: Fair  Recall:  [employer] of Knowledge: Fair  Language:  Fair  Akathisia:  No  Handed:  Left  AIMS (if indicated): not done  Assets:  Communication Skills Desire for Improvement Housing Intimacy Social Support  ADL's:  Intact  Cognition: WNL  Sleep:  Poor   Screenings: GAD-7    Loss adjuster, chartered Office Visit from 01/30/2023 in [facility] Counselor from 01/09/2023 in [facility] Counselor from 12/20/2022 in [facility] Office Visit from 12/12/2022 in [facility]  Office Visit from 10/31/2022 in [facility]  Total GAD-7 Score 19 19 17 18 17       PHQ2-9    Flowsheet Row Video Visit from 03/19/2023 in [facility] Office Visit from 01/30/2023 in [facility] Counselor from 01/09/2023 in [facility] Counselor from 12/20/2022 in [facility] Office Visit from 12/12/2022 in [facility]  PHQ-2 Total Score 5 6 6 6 5   PHQ-9 Total Score 18 24 22 24 22       Flowsheet Row Video Visit from 03/19/2023 in [facility] Office Visit from 01/30/2023 in [facility] Counselor from 01/09/2023 in [facility]  C-SSRS RISK CATEGORY No Risk No Risk Error: Q3, 4, or 5 should not be populated when Q2 is No        Assessment and Plan: ISAC FLORENTINE is a 52 year old Caucasian male, married, currently lives in [place], has a history of MDD, GAD, chronic pain, back surgery, history of brain tumor status post brain surgery, hemifacial paralysis, renal stone was evaluated by telemedicine today.  Patient is currently struggling with depression, anxiety, sleep problems,  will benefit from the following plan.  Plan  MDD-unstable BuSpar 10 mg p.o. twice daily Increase Zoloft to 100 mg p.o. daily.  GAD-improving BuSpar as prescribed Propranolol 10 mg p.o. twice daily as needed Continue CBT  Insomnia-unstable Patient on Lyrica, oxycodone, will continue to need sufficient pain management. Patient to try over-the-counter-sleep #5.  Will consider adding a sleep medication in the future if this does not work.  High risk medication use-pending labs-platelet count, sodium level-I will reorder sodium level again.  Patient to go to [facility] lab.  Follow-up in clinic in 4 to 6 weeks or sooner if needed.  Collaboration of Care: Collaboration of Care: Referral or follow-up with counselor/therapist AEB patient encouraged to continue CBT.  Patient/Guardian was advised Release of Information must be obtained prior to any record release in order to collaborate their care with an outside provider. Patient/Guardian was advised if they have not already done so to contact the registration department to sign all necessary forms in order for [place] to release information regarding their care.   Consent: Patient/Guardian gives verbal consent for treatment and assignment of benefits for services provided during this visit. Patient/Guardian expressed understanding and agreed to proceed.   This note was generated in part or whole with voice recognition software. Voice recognition is usually quite accurate but there are transcription errors that can and very often do occur. I apologize for any typographical errors that were not detected and corrected.    Jomarie Longs, MD 03/20/2023, 4:27 PM

## 2023-03-21 ENCOUNTER — Other Ambulatory Visit
Admission: RE | Admit: 2023-03-21 | Discharge: 2023-03-21 | Disposition: A | Discharge status: Home / Self Care | Payer: BC Managed Care – PPO | Attending: Psychiatry | Admitting: Psychiatry

## 2023-03-21 DIAGNOSIS — Z79899 Other long term (current) drug therapy: Secondary | ICD-10-CM | POA: Insufficient documentation

## 2023-03-21 LAB — SODIUM: Sodium: 138 mmol/L (ref 135–145)

## 2023-04-09 ENCOUNTER — Ambulatory Visit (INDEPENDENT_AMBULATORY_CARE_PROVIDER_SITE_OTHER): Payer: BC Managed Care – PPO | Admitting: Licensed Clinical Social Worker

## 2023-04-09 DIAGNOSIS — F411 Generalized anxiety disorder: Secondary | ICD-10-CM | POA: Diagnosis not present

## 2023-04-09 DIAGNOSIS — F322 Major depressive disorder, single episode, severe without psychotic features: Secondary | ICD-10-CM | POA: Diagnosis not present

## 2023-05-06 ENCOUNTER — Encounter: Payer: Self-pay | Admitting: Psychiatry

## 2023-05-06 ENCOUNTER — Telehealth: Payer: BC Managed Care – PPO | Admitting: Psychiatry

## 2023-05-06 DIAGNOSIS — F411 Generalized anxiety disorder: Secondary | ICD-10-CM | POA: Diagnosis not present

## 2023-05-06 DIAGNOSIS — Z9189 Other specified personal risk factors, not elsewhere classified: Secondary | ICD-10-CM | POA: Diagnosis not present

## 2023-05-06 DIAGNOSIS — F332 Major depressive disorder, recurrent severe without psychotic features: Secondary | ICD-10-CM | POA: Diagnosis not present

## 2023-05-06 DIAGNOSIS — F322 Major depressive disorder, single episode, severe without psychotic features: Secondary | ICD-10-CM

## 2023-05-06 DIAGNOSIS — G4701 Insomnia due to medical condition: Secondary | ICD-10-CM

## 2023-05-06 MED ORDER — BUSPIRONE HCL 10 MG PO TABS: 20.0000 mg | ORAL_TABLET | Freq: Two times a day (BID) | ORAL | 1 refills | Status: DC

## 2023-05-06 NOTE — Progress Notes (Unsigned)
Virtual Visit via Video Note  I connected with Aaron Nichols on 05/06/23 at  2:30 PM EDT by a video enabled telemedicine application and verified that I am speaking with the correct person using two identifiers.  Location Provider Location : [facility] Patient Location : Home  Participants: Patient , Provider    I discussed the limitations of evaluation and management by telemedicine and the availability of in person appointments. The patient expressed understanding and agreed to proceed.   I discussed the assessment and treatment plan with the patient. The patient was provided an opportunity to ask questions and all were answered. The patient agreed with the plan and demonstrated an understanding of the instructions.   The patient was advised to call back or seek an in-person evaluation if the symptoms worsen or if the condition fails to improve as anticipated.   BH MD OP Progress Note  05/06/2023 2:58 PM Aaron Nichols  MRN:  [id number]  Chief Complaint:  Chief Complaint  Patient presents with   Follow-up   Anxiety   Depression   Medication Refill   HPI: Aaron Nichols is a 52 year old Caucasian male who has applied for disability, married, lives in [place], has a history of MDD, GAD, insomnia, history of brain tumor, acoustic neuroma, chronic face cramping, numbness, tingling in right foot, hemifacial paralysis, back pain was evaluated by telemedicine today.  Patient today reports he has noticed worsening mood symptoms since the past several days.  Patient reports continues to have multiple situational stressors including chronic pain, financial stressors, disability application being denied.  Patient reports he currently struggles with worrying about things, feeling nervous, sadness, low motivation, low energy, sleep problems.  Patient reports his sleep problems are also due to his pain.  He currently does take medications for pain including Lyrica, opioids.  Patient reports he tried to sleep  #3 which contains melatonin and that helped him to fall asleep.  However sleep continued to be interrupted even on that medication.  Patient currently denies any suicidality, homicidality or perceptual disturbances.  Patient reports she is motivated to stay in therapy does have upcoming appointment with Ms. Primus Bravo.  Patient is currently compliant on medications like Zoloft, BuSpar.  Agreeable to dosage increase of BuSpar.  Also discussed trial of medications like Seroquel since he continues to have worsening mood as well as sleep issues.  Patient aware of drug to drug interaction between Seroquel and his opioids and agrees to reach out to his pain provider regarding a clearance to start this medication.  In the meantime patient agrees to get an EKG to monitor QTc prolongation.  Patient denies any other concerns today.  Visit Diagnosis:    ICD-10-CM   1. Current severe episode of major depressive disorder without psychotic features without prior episode (HCC)  F32.2 EKG 12-Lead    busPIRone (BUSPAR) 10 MG tablet    2. GAD (generalized anxiety disorder)  F41.1 EKG 12-Lead    3. Insomnia due to medical condition  G47.01    Pain    4. At risk for prolonged QT interval syndrome  Z91.89 EKG 12-Lead      Past Psychiatric History: I have reviewed past psychiatric history from progress note on 10/31/2022.  Past trials of medications like Cymbalta.  Past Medical History:  Past Medical History:  Diagnosis Date   Abnormal LFTs (liver function tests)    Arthritis    BP (high blood pressure) 01/26/2015   Brain tumor (HCC)    acoustic  neuroma   Complication of anesthesia 10/22/2006   brain tumor 15 hours surgery resulting nausea and vomiting for severeal days   Complication of anesthesia    pt. woke up before extubation with cystoscopy- pt not aware of this   GERD (gastroesophageal reflux disease)    Hydronephrosis with urinary obstruction due to ureteral calculus 07/03/2015    Hypertension    Infection of the upper respiratory tract 04/22/2015   Kidney stone 10/22/1988   PONV (postoperative nausea and vomiting)    Right ureteral stone 07/03/2015   Right-sided Bell's palsy 10/22/2006   nerve fatigue    Past Surgical History:  Procedure Laterality Date   BRAIN TUMOR EXCISION  2008   CHOLECYSTECTOMY  2022   CYSTOSCOPY W/ RETROGRADES Left 09/07/2015   Procedure: CYSTOSCOPY WITH RETROGRADE PYELOGRAM;  Surgeon: Hildred Laser, MD;  Location: [facility];  Service: Urology;  Laterality: Left;   CYSTOSCOPY W/ RETROGRADES Left 10/03/2015   Procedure: CYSTOSCOPY WITH RETROGRADE PYELOGRAM;  Surgeon: Hildred Laser, MD;  Location: [facility];  Service: Urology;  Laterality: Left;   CYSTOSCOPY WITH STENT PLACEMENT Right 07/06/2015   Procedure: CYSTOSCOPY WITH STENT PLACEMENT;  Surgeon: Lorraine Lax, MD;  Location: [facility];  Service: Urology;  Laterality: Right;   CYSTOSCOPY WITH STENT PLACEMENT Left 09/07/2015   Procedure: CYSTOSCOPY WITH STENT PLACEMENT;  Surgeon: Hildred Laser, MD;  Location: [facility];  Service: Urology;  Laterality: Left;   CYSTOSCOPY WITH STENT PLACEMENT Left 10/03/2015   Procedure: CYSTOSCOPY WITH STENT PLACEMENT/ EXCHANGE/STONE BASKETING;  Surgeon: Hildred Laser, MD;  Location: [facility];  Service: Urology;  Laterality: Left;   ELBOW FRACTURE SURGERY Left 10/22/1996   INCISION AND DRAINAGE ABSCESS  2022   pt states this abscess was between his scrotum and anus   LUMBAR LAMINECTOMY/DECOMPRESSION MICRODISCECTOMY N/A 01/11/2022   Procedure: Microlumbar decompression Lumbar three-four;  Surgeon: Jene Every, MD;  Location: [facility] OR;  Service: Orthopedics;  Laterality: N/A;   URETEROSCOPY WITH HOLMIUM LASER LITHOTRIPSY Right 07/06/2015   Procedure: URETEROSCOPY ;  Surgeon: Lorraine Lax, MD;  Location: [facility];  Service: Urology;  Laterality: Right;   URETEROSCOPY WITH HOLMIUM LASER LITHOTRIPSY Right 07/25/2015   Procedure: URETEROSCOPY  WITH HOLMIUM LASER LITHOTRIPSY, CYSTOSCOPY,  STENT REMOVAL ;  Surgeon: Lorraine Lax, MD;  Location: [facility];  Service: Urology;  Laterality: Right;   URETEROSCOPY WITH HOLMIUM LASER LITHOTRIPSY Left 09/07/2015   Procedure: URETEROSCOPY;  Surgeon: Hildred Laser, MD;  Location: [facility];  Service: Urology;  Laterality: Left;   URETEROSCOPY WITH HOLMIUM LASER LITHOTRIPSY Left 10/03/2015   Procedure: URETEROSCOPY WITH HOLMIUM LASER LITHOTRIPSY;  Surgeon: Hildred Laser, MD;  Location: [facility];  Service: Urology;  Laterality: Left;    Family Psychiatric History: I have reviewed family psychiatric history from progress note on 10/31/2022.  Family History:  Family History  Problem Relation Age of Onset   Schizophrenia Mother    Alcohol abuse Father    Heart disease Father    Drug abuse Daughter     Social History: I have reviewed social history from progress note on 10/31/2022. Social History   Socioeconomic History   Marital status: Married    Spouse name: Not on file   Number of children: 1   Years of education: Not on file   Highest education level: Some college, no degree  Occupational History   Not on file  Tobacco Use   Smoking status: Former    Current packs/day: 0.00  Types: Cigarettes    Quit date: 10/13/2021    Years since quitting: 1.5   Smokeless tobacco: Never  Vaping Use   Vaping status: Never Used  Substance and Sexual Activity   Alcohol use: No   Drug use: No   Sexual activity: Not Currently  Other Topics Concern   Not on file  Social History Narrative   Not on file   Social Determinants of Health   Financial Resource Strain: Medium Risk (12/09/2020)   Received [facility], [facility]   Overall Financial Resource Strain (CARDIA)    Difficulty of Paying Living Expenses: Somewhat hard  Food Insecurity: Food Insecurity Present (12/09/2020)   Received from [facility], [facility]   Hunger Vital Sign    Worried About  Running Out of Food in the Last Year: Sometimes true    Ran Out of Food in the Last Year: Sometimes true  Transportation Needs: No Transportation Needs (12/09/2020)   Received from [facility], [facility]   [facility]    Lack of Transportation (Medical): No    Lack of Transportation (Non-Medical): No  Physical Activity: Not on file  Stress: Not on file  Social Connections: Not on file    Allergies:  Allergies  Allergen Reactions   Ceftin [Cefuroxime Axetil]     Dizziness,lightheaded    Metabolic Disorder Labs: No results found for: "HGBA1C", "MPG" No results found for: "PROLACTIN" No results found for: "CHOL", "TRIG", "HDL", "CHOLHDL", "VLDL", "LDLCALC" Lab Results  Component Value Date   TSH 1.925 11/26/2022    Therapeutic Level Labs: No results found for: "LITHIUM" No results found for: "VALPROATE" No results found for: "CBMZ"  Current Medications: Current Outpatient Medications  Medication Sig Dispense Refill   amLODipine (NORVASC) 5 MG tablet Take 5 mg by mouth daily.     atorvastatin (LIPITOR) 20 MG tablet Take 20 mg by mouth daily.     famotidine (PEPCID) 20 MG tablet Take 20 mg by mouth daily.     levocetirizine (XYZAL) 5 MG tablet Take 5 mg by mouth every evening.     lisinopril (ZESTRIL) 10 MG tablet Take 10 mg by mouth daily.     oxyCODONE (OXY IR/ROXICODONE) 5 MG immediate release tablet TAKE 1 TABLET BY MOUTH EVERY 4 HOURS     oxyCODONE-acetaminophen (PERCOCET/ROXICET) 5-325 MG tablet Take by mouth 2 (two) times daily.     polyethylene glycol (MIRALAX / GLYCOLAX) 17 g packet Take 17 g by mouth daily. 14 each 0   pregabalin (LYRICA) 25 MG capsule Take 25 mg by mouth once.     sertraline (ZOLOFT) 100 MG tablet Take 1 tablet (100 mg total) by mouth daily. 90 tablet 0   busPIRone (BUSPAR) 10 MG tablet Take 2 tablets (20 mg total) by mouth 2 (two) times daily. 120 tablet 1   propranolol (INDERAL) 10 MG tablet TAKE 1 TABLET(10 MG) BY MOUTH  TWICE DAILY AS NEEDED FOR SEVERE ANXIETY (Patient not taking: Reported on 05/06/2023) 60 tablet 1   No current facility-administered medications for this visit.     Musculoskeletal: Strength & Muscle Tone:  UTA Gait & Station:  Seated Patient leans: N/A  Psychiatric Specialty Exam: Review of Systems  Psychiatric/Behavioral:  Positive for decreased concentration, dysphoric mood and sleep disturbance. The patient is nervous/anxious.     There were no vitals taken for this visit.There is no height or weight on file to calculate BMI.  General Appearance: Casual  Eye  Contact:  Fair  Speech:  Clear and Coherent  Volume:  Normal  Mood:  Anxious and Depressed  Affect:  Congruent  Thought Process:  Goal Directed and Descriptions of Associations: Intact  Orientation:  Full (Time, Place, and Person)  Thought Content: Logical   Suicidal Thoughts:  No  Homicidal Thoughts:  No  Memory:  Immediate;   Fair Recent;   Fair Remote;   Fair  Judgement:  Fair  Insight:  Fair  Psychomotor Activity:  Normal  Concentration:  Concentration: Fair and Attention Span: Fair  Recall:  [employer] of Knowledge: Fair  Language: Fair  Akathisia:  No  Handed:  Left  AIMS (if indicated): not done  Assets:  Communication Skills Desire for Improvement Housing Social Support  ADL's:  Intact  Cognition: WNL  Sleep:  Poor   Screenings: GAD-7    Advertising copywriter from 04/09/2023 in [facility] Office Visit from 01/30/2023 in [facility] Counselor from 01/09/2023 in [facility] Counselor from 12/20/2022 in [facility] Office Visit from 12/12/2022 in [facility]  Total GAD-7 Score 18 19 19 17 18       PHQ2-9    Flowsheet Row Counselor from 04/09/2023 in [facility] Video Visit from 03/19/2023 in [facility] Office Visit from 01/30/2023 in [facility] Counselor from 01/09/2023 in [facility] Counselor from 12/20/2022 in [facility]  PHQ-2 Total Score 5 5 6 6 6   PHQ-9 Total Score 21 18 24 22 24       Flowsheet Row Counselor from 04/09/2023 in [facility] Video Visit from 03/19/2023 in [facility] Office Visit from 01/30/2023 in [facility]  C-SSRS RISK CATEGORY No Risk No Risk No Risk        Assessment and Plan: Aaron Nichols is a 52 year old Caucasian male, married, currently lives in [place], with chronic pain, anxiety and depression, worsening mood symptoms, sleep problems, will benefit from medication readjustment, psychotherapy sessions.  Plan as noted below.  Plan MDD-unstable Increase BuSpar to 20 mg p.o. twice daily. Continue Zoloft 100 mg p.o. daily Discussed adding Seroquel low-dose at 25 mg at bedtime however patient to reach out to pain provider for clearance to start this medication given drug to drug interaction with opioids.  Also will get an EKG in the meantime to monitor for QTc prolongation.  GAD-unstable Increase BuSpar to 20 mg p.o. twice daily Zoloft 100 mg p.o. daily Continue CBT with Ms. Primus Bravo  Insomnia-unstable Patient's sleep problems multifactorial including chronic pain, mood symptoms. Will consider starting Seroquel as noted above.  High risk medication use-reviewed and discussed labs-sodium-138-within normal limits.   At risk for prolonged QT syndrome-we will order EKG-patient to call [phone] to get this done.  Follow-up in clinic in 4 weeks or sooner in person.   Collaboration of Care: Collaboration of Care: Referral or  follow-up with counselor/therapist AEB patient to continue CBT.  Patient/Guardian was advised Release of Information must be obtained prior to any record release in order to collaborate their care with an outside provider. Patient/Guardian was advised if they have not already done so to contact the registration department to sign all necessary forms in order for [place] to release information regarding  their care.   Consent: Patient/Guardian gives verbal consent for treatment and assignment of benefits for services provided during this visit. Patient/Guardian expressed understanding and agreed to proceed.   This note was generated in part or whole with voice recognition software. Voice recognition is usually quite accurate but there are transcription errors that can and very often do occur. I apologize for any typographical errors that were not detected and corrected.    Jomarie Longs, MD 05/06/2023, 2:58 PM

## 2023-05-06 NOTE — Patient Instructions (Signed)
Please call for EKG - [phone]   Quetiapine Tablets What is this medication? QUETIAPINE (kwe TYE a peen) treats schizophrenia and bipolar disorder. It works by balancing the levels of dopamine and serotonin in your brain, hormones that help regulate mood, behaviors, and thoughts. It belongs to a group of medications called antipsychotics. Antipsychotic medications can be used to treat several kinds of mental health conditions. This medicine may be used for other purposes; ask your health care provider or pharmacist if you have questions. COMMON BRAND NAME(S): Seroquel What should I tell my care team before I take this medication? They need to know if you have any of these conditions: Blockage in your bowels Cataracts Constipation Dementia Diabetes Difficulty swallowing Glaucoma Heart disease High levels of prolactin History of breast cancer History of irregular heartbeat Liver disease Low blood cell levels (white cells, red cells, and platelets) Low blood pressure Parkinson disease Prostate disease Seizures Suicidal thoughts, plans, or attempt by you or a family member Thyroid disease Trouble passing urine An unusual or allergic reaction to quetiapine, other medications, foods, dyes, or preservatives Pregnant or trying to get pregnant Breastfeeding How should I use this medication? Take this medication by mouth with water. Take it as directed on the prescription label at the same time every day. You can take it with or without food. If it upsets your stomach, take it with food. Keep taking it unless your care team tells you to stop. A special MedGuide will be given to you by the pharmacist with each prescription and refill. Be sure to read this information carefully each time. Talk to your care team about the use of this medication in children. While this medication may be prescribed for children as young as 10 years for selected conditions, precautions do apply. People over  11 years of age may have a stronger reaction to this medication and need smaller doses. Overdosage: If you think you have taken too much of this medicine contact a poison control center or emergency room at once. NOTE: This medicine is only for you. Do not share this medicine with others. What if I miss a dose? If you miss a dose, take it as soon as you can. If it is almost time for your next dose, take only that dose. Do not take double or extra doses. What may interact with this medication? Do not take this medication with any of the following: Cisapride Dronedarone Metoclopramide Pimozide Thioridazine This medication may also interact with the following: Alcohol Antihistamines for allergy, cough, and cold Atropine Avasimibe Certain antivirals for HIV or hepatitis Certain medications for anxiety or sleep Certain medications for bladder problems, such as oxybutynin, tolterodine Certain medications for depression, such as amitriptyline, fluoxetine, nefazodone, sertraline Certain medications for fungal infections, such as fluconazole, ketoconazole, itraconazole, posaconazole Certain medications for stomach problems, such as dicyclomine, hyoscyamine Certain medications for travel sickness, such as scopolamine Cimetidine General anesthetics, such as halothane, isoflurane, methoxyflurane, propofol Ipratropium Levodopa or other medications for Parkinson disease Medications for blood pressure Medications for seizures Medications that relax muscles for surgery Opioid medications for pain Other medications that cause heart rhythm changes Phenothiazines, such as chlorpromazine, prochlorperazine Rifampin St. Pratik's wort This list may not describe all possible interactions. Give your health care provider a list of all the medicines, herbs, non-prescription drugs, or dietary supplements you use. Also tell them if you smoke, drink alcohol, or use illegal drugs. Some items may interact with  your medicine. What should I watch for while  using this medication? Visit your care team for regular checks on your progress. Tell your care team if your symptoms do not start to get better or if they get worse. Do not suddenly stop taking This medication. You may develop a severe reaction. Your care team will tell you how much medication to take. If your care team wants you to stop the medication, the dose may be slowly lowered over time to avoid any side effects. You may need to have an eye exam before and during use of this medication. This medication may increase blood sugar. Ask your care team if changes in diet or medications are needed if you have diabetes. This medication may cause thoughts of suicide or depression. This includes sudden changes in mood, behaviors, or thoughts. These changes can happen at any time but are more common in the beginning of treatment or after a change in dose. Call your care team right away if you experience these thoughts or worsening depression. This medication may affect your coordination, reaction time, or judgment. Do not drive or operate machinery until you know how this medication affects you. Sit up or stand slowly to reduce the risk of dizzy or fainting spells. Drinking alcohol with this medication can increase the risk of these side effects. This medication can cause problems with controlling your body temperature. It can lower the response of your body to cold temperatures. If possible, stay indoors during cold weather. If you must go outdoors, wear warm clothes. It can also lower the response of your body to heat. Do not overheat. Do not over-exercise. Stay out of the sun when possible. If you must be in the sun, wear cool clothing. Drink plenty of water. If you have trouble controlling your body temperature, call your care team right away. What side effects may I notice from receiving this medication? Side effects that you should report to your care team as  soon as possible: Allergic reactions--skin rash, itching, hives, swelling of the face, lips, tongue, or throat Heart rhythm changes--fast or irregular heartbeat, dizziness, feeling faint or lightheaded, chest pain, trouble breathing High blood sugar (hyperglycemia)--increased thirst or amount of urine, unusual weakness or fatigue, blurry vision High fever, stiff muscles, increased sweating, fast or irregular heartbeat, and confusion, which may be signs of neuroleptic malignant syndrome High prolactin level--unexpected breast tissue growth, discharge from the nipple, change in sex drive or performance, irregular menstrual cycle Increase in blood pressure in children Infection--fever, chills, cough, or sore throat Low blood pressure--dizziness, feeling faint or lightheaded, blurry vision Low thyroid levels (hypothyroidism)--unusual weakness or fatigue, increased sensitivity to cold, constipation, hair loss, dry skin, weight gain, feelings of depression Pain or trouble swallowing Seizures Stroke--sudden numbness or weakness of the face, arm, or leg, trouble speaking, confusion, trouble walking, loss of balance or coordination, dizziness, severe headache, change in vision Sudden eye pain or change in vision such as blurry vision, seeing halos around lights, vision loss Thoughts of suicide or self-harm, worsening mood, feelings of depression Trouble passing urine Uncontrolled and repetitive body movements, muscle stiffness or spasms, tremors or shaking, loss of balance or coordination, restlessness, shuffling walk, which may be signs of extrapyramidal symptoms (EPS) Side effects that usually do not require medical attention (report to your care team if they continue or are bothersome): Constipation Dizziness Drowsiness Dry mouth Weight gain This list may not describe all possible side effects. Call your doctor for medical advice about side effects. You may report side effects to FDA at  1-800-FDA-1088. Where should I keep my medication? Keep out of the reach of children. Store at room temperature between 15 and 30 degrees C (59 and 86 degrees F). Throw away any unused medication after the expiration date. NOTE: This sheet is a summary. It may not cover all possible information. If you have questions about this medicine, talk to your doctor, pharmacist, or health care provider.  2024 Elsevier/Gold Standard (2022-04-23 00:00:00)

## 2023-05-07 ENCOUNTER — Ambulatory Visit: Payer: BC Managed Care – PPO | Admitting: Licensed Clinical Social Worker

## 2023-05-09 ENCOUNTER — Ambulatory Visit
Admission: RE | Admit: 2023-05-09 | Discharge: 2023-05-09 | Disposition: A | Discharge status: Home / Self Care | Payer: BC Managed Care – PPO | Source: Ambulatory Visit | Attending: Psychiatry | Admitting: Psychiatry

## 2023-05-09 DIAGNOSIS — Z9189 Other specified personal risk factors, not elsewhere classified: Secondary | ICD-10-CM | POA: Insufficient documentation

## 2023-05-09 DIAGNOSIS — F411 Generalized anxiety disorder: Secondary | ICD-10-CM | POA: Insufficient documentation

## 2023-05-09 DIAGNOSIS — F332 Major depressive disorder, recurrent severe without psychotic features: Secondary | ICD-10-CM | POA: Diagnosis not present

## 2023-05-09 DIAGNOSIS — Z5181 Encounter for therapeutic drug level monitoring: Secondary | ICD-10-CM | POA: Diagnosis not present

## 2023-05-13 ENCOUNTER — Encounter: Payer: Self-pay | Admitting: Licensed Clinical Social Worker

## 2023-05-13 ENCOUNTER — Ambulatory Visit (INDEPENDENT_AMBULATORY_CARE_PROVIDER_SITE_OTHER): Payer: BC Managed Care – PPO | Admitting: Licensed Clinical Social Worker

## 2023-05-13 DIAGNOSIS — F332 Major depressive disorder, recurrent severe without psychotic features: Secondary | ICD-10-CM | POA: Diagnosis not present

## 2023-05-13 DIAGNOSIS — F411 Generalized anxiety disorder: Secondary | ICD-10-CM | POA: Diagnosis not present

## 2023-05-14 ENCOUNTER — Telehealth: Payer: Self-pay | Admitting: Psychiatry

## 2023-05-14 DIAGNOSIS — F332 Major depressive disorder, recurrent severe without psychotic features: Secondary | ICD-10-CM

## 2023-05-14 MED ORDER — QUETIAPINE FUMARATE 25 MG PO TABS: 25.0000 mg | ORAL_TABLET | Freq: Every day | ORAL | 1 refills | Status: DC

## 2023-05-14 NOTE — Telephone Encounter (Signed)
Will start patient on Seroquel 25 mg I have sent it to [facility].

## 2023-06-04 ENCOUNTER — Ambulatory Visit (INDEPENDENT_AMBULATORY_CARE_PROVIDER_SITE_OTHER): Payer: BC Managed Care – PPO | Admitting: Licensed Clinical Social Worker

## 2023-06-04 DIAGNOSIS — F411 Generalized anxiety disorder: Secondary | ICD-10-CM

## 2023-06-04 DIAGNOSIS — F332 Major depressive disorder, recurrent severe without psychotic features: Secondary | ICD-10-CM | POA: Diagnosis not present

## 2023-06-14 ENCOUNTER — Other Ambulatory Visit: Payer: Self-pay | Admitting: Psychiatry

## 2023-06-14 DIAGNOSIS — F332 Major depressive disorder, recurrent severe without psychotic features: Secondary | ICD-10-CM

## 2023-06-18 ENCOUNTER — Ambulatory Visit (INDEPENDENT_AMBULATORY_CARE_PROVIDER_SITE_OTHER): Payer: BC Managed Care – PPO | Admitting: Licensed Clinical Social Worker

## 2023-06-18 DIAGNOSIS — F332 Major depressive disorder, recurrent severe without psychotic features: Secondary | ICD-10-CM | POA: Diagnosis not present

## 2023-06-18 DIAGNOSIS — F411 Generalized anxiety disorder: Secondary | ICD-10-CM

## 2023-06-19 ENCOUNTER — Encounter: Payer: Self-pay | Admitting: Psychiatry

## 2023-06-19 ENCOUNTER — Ambulatory Visit (INDEPENDENT_AMBULATORY_CARE_PROVIDER_SITE_OTHER): Payer: BC Managed Care – PPO | Admitting: Psychiatry

## 2023-06-19 VITALS — BP 161/92 | HR 70 | Temp 97.2°F | Ht 70.0 in | Wt 241.6 lb

## 2023-06-19 DIAGNOSIS — F332 Major depressive disorder, recurrent severe without psychotic features: Secondary | ICD-10-CM

## 2023-06-19 DIAGNOSIS — G4701 Insomnia due to medical condition: Secondary | ICD-10-CM | POA: Diagnosis not present

## 2023-06-19 DIAGNOSIS — F411 Generalized anxiety disorder: Secondary | ICD-10-CM | POA: Diagnosis not present

## 2023-06-19 MED ORDER — PROPRANOLOL HCL 10 MG PO TABS: 10.0000 mg | ORAL_TABLET | Freq: Two times a day (BID) | ORAL | 2 refills | Status: DC | PRN

## 2023-06-19 MED ORDER — BUSPIRONE HCL 10 MG PO TABS: 20.0000 mg | ORAL_TABLET | Freq: Two times a day (BID) | ORAL | 0 refills | Status: DC

## 2023-06-19 MED ORDER — SERTRALINE HCL 100 MG PO TABS: 150.0000 mg | ORAL_TABLET | Freq: Every day | ORAL | 0 refills | Status: DC

## 2023-06-19 NOTE — Progress Notes (Unsigned)
BH MD OP Progress Note  06/19/2023 4:04 PM Aaron Nichols  MRN:  [id number]  Chief Complaint:  Chief Complaint  Patient presents with   Follow-up   Depression   Anxiety   Medication Refill   HPI: Aaron Nichols is a 52 year old Caucasian male, applied for disability, married, lives in [place] has a history of MDD, GAD, insomnia, history of brain tumor, caustic neuroma, chronic face cramping, numbness, tingling in right foot, hemifacial paralysis, back pain, chronic was evaluated in office today.  Patient today reports he is currently improving with regards to his sleep issues on the Seroquel.  He is currently able to fall asleep quicker and stay asleep better. Although sleep is interrupted that does not last too long.  He also has noticed some benefit with regards to his mood on the Seroquel.  Patient however reports due to his chronic pain and other situational stresses he continues to struggle with feeling sad, lack of interest in doing anything that he enjoys other than taking care of his home, cooking.  Patient reports he feels hopeless a lot.  He currently denies any suicidality.  He does continue to worry a lot mostly about his situational stressors at his health.  He does have anxiety attacks on and off.  Patient is currently compliant on medications like BuSpar, sertraline, Seroquel, uses the propranolol as needed.  Patient denies any suicidality, homicidality or perceptual disturbances.  Patient is motivated to stay in therapy however his therapist is moving away.  Patient would like to establish care with a new therapist.  Agreeable to referral.  Patient denies any other concerns today.  Visit Diagnosis:    ICD-10-CM   1. Severe episode of recurrent major depressive disorder, without psychotic features (HCC)  F33.2 busPIRone (BUSPAR) 10 MG tablet    2. GAD (generalized anxiety disorder)  F41.1 sertraline (ZOLOFT) 100 MG tablet    3. Insomnia due to medical condition  G47.01  propranolol (INDERAL) 10 MG tablet   pain, mood      Past Psychiatric History: I have reviewed past psychiatric history from progress note on 10/31/2022.  Past trials of medications like Cymbalta  Past Medical History:  Past Medical History:  Diagnosis Date   Abnormal LFTs (liver function tests)    Arthritis    BP (high blood pressure) 01/26/2015   Brain tumor (HCC)    acoustic neuroma   Complication of anesthesia 10/22/2006   brain tumor 15 hours surgery resulting nausea and vomiting for severeal days   Complication of anesthesia    pt. woke up before extubation with cystoscopy- pt not aware of this   GERD (gastroesophageal reflux disease)    Hydronephrosis with urinary obstruction due to ureteral calculus 07/03/2015   Hypertension    Infection of the upper respiratory tract 04/22/2015   Kidney stone 10/22/1988   PONV (postoperative nausea and vomiting)    Right ureteral stone 07/03/2015   Right-sided Bell's palsy 10/22/2006   nerve fatigue    Past Surgical History:  Procedure Laterality Date   BRAIN TUMOR EXCISION  2008   CHOLECYSTECTOMY  2022   CYSTOSCOPY W/ RETROGRADES Left 09/07/2015   Procedure: CYSTOSCOPY WITH RETROGRADE PYELOGRAM;  Surgeon: Hildred Laser, MD;  Location: [facility];  Service: Urology;  Laterality: Left;   CYSTOSCOPY W/ RETROGRADES Left 10/03/2015   Procedure: CYSTOSCOPY WITH RETROGRADE PYELOGRAM;  Surgeon: Hildred Laser, MD;  Location: [facility];  Service: Urology;  Laterality: Left;   CYSTOSCOPY WITH STENT PLACEMENT Right 07/06/2015  Procedure: CYSTOSCOPY WITH STENT PLACEMENT;  Surgeon: Lorraine Lax, MD;  Location: [facility];  Service: Urology;  Laterality: Right;   CYSTOSCOPY WITH STENT PLACEMENT Left 09/07/2015   Procedure: CYSTOSCOPY WITH STENT PLACEMENT;  Surgeon: Hildred Laser, MD;  Location: [facility];  Service: Urology;  Laterality: Left;   CYSTOSCOPY WITH STENT PLACEMENT Left 10/03/2015   Procedure: CYSTOSCOPY WITH STENT  PLACEMENT/ EXCHANGE/STONE BASKETING;  Surgeon: Hildred Laser, MD;  Location: [facility];  Service: Urology;  Laterality: Left;   ELBOW FRACTURE SURGERY Left 10/22/1996   INCISION AND DRAINAGE ABSCESS  2022   pt states this abscess was between his scrotum and anus   LUMBAR LAMINECTOMY/DECOMPRESSION MICRODISCECTOMY N/A 01/11/2022   Procedure: Microlumbar decompression Lumbar three-four;  Surgeon: Jene Every, MD;  Location: [facility] OR;  Service: Orthopedics;  Laterality: N/A;   URETEROSCOPY WITH HOLMIUM LASER LITHOTRIPSY Right 07/06/2015   Procedure: URETEROSCOPY ;  Surgeon: Lorraine Lax, MD;  Location: [facility];  Service: Urology;  Laterality: Right;   URETEROSCOPY WITH HOLMIUM LASER LITHOTRIPSY Right 07/25/2015   Procedure: URETEROSCOPY WITH HOLMIUM LASER LITHOTRIPSY, CYSTOSCOPY,  STENT REMOVAL ;  Surgeon: Lorraine Lax, MD;  Location: [facility];  Service: Urology;  Laterality: Right;   URETEROSCOPY WITH HOLMIUM LASER LITHOTRIPSY Left 09/07/2015   Procedure: URETEROSCOPY;  Surgeon: Hildred Laser, MD;  Location: [facility];  Service: Urology;  Laterality: Left;   URETEROSCOPY WITH HOLMIUM LASER LITHOTRIPSY Left 10/03/2015   Procedure: URETEROSCOPY WITH HOLMIUM LASER LITHOTRIPSY;  Surgeon: Hildred Laser, MD;  Location: [facility];  Service: Urology;  Laterality: Left;    Family Psychiatric History: Reviewed family psychiatric history from progress note on 10/31/2022.  Family History:  Family History  Problem Relation Age of Onset   Schizophrenia Mother    Alcohol abuse Father    Heart disease Father    Drug abuse Daughter     Social History: Reviewed social history from progress note on 10/31/2022. Social History   Socioeconomic History   Marital status: Married    Spouse name: Not on file   Number of children: 1   Years of education: Not on file   Highest education level: Some college, no degree  Occupational History   Not on file  Tobacco Use   Smoking status: Former     Current packs/day: 0.00    Types: Cigarettes    Quit date: 10/13/2021    Years since quitting: 1.6   Smokeless tobacco: Never  Vaping Use   Vaping status: Never Used  Substance and Sexual Activity   Alcohol use: No   Drug use: No   Sexual activity: Not Currently  Other Topics Concern   Not on file  Social History Narrative   Not on file   Social Determinants of Health   Financial Resource Strain: Medium Risk (12/09/2020)   Received [facility], [facility]   Overall Financial Resource Strain (CARDIA)    Difficulty of Paying Living Expenses: Somewhat hard  Food Insecurity: Food Insecurity Present (12/09/2020)   [facility], [facility]   Hunger Vital Sign    Worried About Running Out of Food in the Last Year: Sometimes true    Ran Out of Food in the Last Year: Sometimes true  Transportation Needs: No Transportation Needs (12/09/2020)   [facility], [facility]   PRAPARE - Transportation    Lack of Transportation (Medical): No    Lack of Transportation (Non-Medical): No  Physical Activity: Not on file  Stress: Not on file  Social Connections: Not on file    Allergies:  Allergies  Allergen Reactions   Ceftin [Cefuroxime Axetil]     Dizziness,lightheaded   Prednisone Other (See Comments)    Metabolic Disorder Labs: No results found for: "HGBA1C", "MPG" No results found for: "PROLACTIN" No results found for: "CHOL", "TRIG", "HDL", "CHOLHDL", "VLDL", "LDLCALC" Lab Results  Component Value Date   TSH 1.925 11/26/2022    Therapeutic Level Labs: No results found for: "LITHIUM" No results found for: "VALPROATE" No results found for: "CBMZ"  Current Medications: Current Outpatient Medications  Medication Sig Dispense Refill   amLODipine (NORVASC) 5 MG tablet Take 5 mg by mouth daily.     atorvastatin (LIPITOR) 20 MG tablet Take 20 mg by mouth daily.     famotidine (PEPCID) 20 MG tablet Take 20 mg by mouth  daily.     levocetirizine (XYZAL) 5 MG tablet Take 5 mg by mouth every evening.     lisinopril (ZESTRIL) 10 MG tablet Take 10 mg by mouth daily.     oxyCODONE (OXY IR/ROXICODONE) 5 MG immediate release tablet TAKE 1 TABLET BY MOUTH EVERY 4 HOURS     oxyCODONE-acetaminophen (PERCOCET/ROXICET) 5-325 MG tablet Take by mouth 2 (two) times daily.     polyethylene glycol (MIRALAX / GLYCOLAX) 17 g packet Take 17 g by mouth daily. 14 each 0   pregabalin (LYRICA) 25 MG capsule Take 25 mg by mouth once.     QUEtiapine (SEROQUEL) 25 MG tablet TAKE 1 TABLET(25 MG) BY MOUTH AT BEDTIME 30 tablet 1   busPIRone (BUSPAR) 10 MG tablet Take 2 tablets (20 mg total) by mouth 2 (two) times daily. 360 tablet 0   propranolol (INDERAL) 10 MG tablet Take 1 tablet (10 mg total) by mouth 2 (two) times daily as needed. 60 tablet 2   sertraline (ZOLOFT) 100 MG tablet Take 1.5 tablets (150 mg total) by mouth daily. 135 tablet 0   No current facility-administered medications for this visit.     Musculoskeletal: Strength & Muscle Tone: within normal limits Gait & Station:  Walks with cane Patient leans: N/A  Psychiatric Specialty Exam: Review of Systems  HENT:         Facial asymmetry , chronic history of hemifacial paralysis  Psychiatric/Behavioral:  Positive for decreased concentration and dysphoric mood. The patient is nervous/anxious.     Blood pressure (!) 161/92, pulse 70, temperature (!) 97.2 F (36.2 C), temperature source Skin, height 5\' 10"  (1.778 m), weight 241 lb 9.6 oz (109.6 kg).Body mass index is 34.67 kg/m.  General Appearance: Fairly Groomed  Eye Contact:  Fair  Speech:  Normal Rate  Volume:  Normal  Mood:  Anxious and Depressed, some improvement  Affect:  Depressed  Thought Process:  Goal Directed and Descriptions of Associations: Intact  Orientation:  Full (Time, Place, and Person)  Thought Content: Logical   Suicidal Thoughts:  No  Homicidal Thoughts:  No  Memory:  Immediate;    Fair Recent;   Fair Remote;   Fair  Judgement:  Fair  Insight:  Fair  Psychomotor Activity:  Normal  Concentration:  Concentration: Fair and Attention Span: Fair  Recall:  [employer] of Knowledge: Fair  Language: Fair  Akathisia:  No  Handed:  Left  AIMS (if indicated): done  Assets:  Communication Skills Desire for Improvement Housing Social Support  ADL's:  Intact  Cognition: WNL  Sleep:   improving  Screenings: AIMS    Flowsheet Row Office Visit from 06/19/2023 in [facility]  AIMS Total Score 0      GAD-7    Flowsheet Row Office Visit from 06/19/2023 in [facility] Counselor from 06/04/2023 in [facility] Counselor from 04/09/2023 in [facility] Office Visit from 01/30/2023 in [facility] Counselor from 01/09/2023 in [facility]  Total GAD-7 Score 14 16 18 19 19       PHQ2-9    Flowsheet Row Office Visit from 06/19/2023 in [facility] Counselor from 06/04/2023 in [facility] Counselor from 04/09/2023 in [facility] Video Visit from 03/19/2023 in [facility] Office Visit from 01/30/2023 in [facility]  PHQ-2 Total Score 6 6 5 5 6   PHQ-9 Total Score 15 21 21 18 24       Flowsheet Row Office Visit from 06/19/2023 in [facility] Counselor from 06/04/2023 in [facility] Video Visit from 05/06/2023 in [facility]  C-SSRS RISK CATEGORY No Risk No Risk No Risk        Assessment and Plan: Aaron Nichols is a 52 year old  Caucasian male, married, currently lives in [place], has a history of MDD, GAD, multiple medical problems including chronic pain, financial stressors, disability being denied, patient will continue to benefit from medication management for continued mood symptoms as well as will benefit from psychotherapy sessions, plan as noted below.  Plan MDD-unstable Continue BuSpar 20 mg p.o. twice daily Increase Zoloft to 150 mg p.o. daily Continue Seroquel 25 mg at bedtime  GAD-unstable Patient will benefit from more frequent psychotherapy sessions, I have provided information for Ms. Felecia Jan. BuSpar 20 mg p.o. twice daily Increase Zoloft to 150 mg p.o. daily  Insomnia-improving Continue Seroquel 25 mg at bedtime Patient aware of drug to drug interaction with medications like Seroquel and oxycodone. Patient also will need sufficient pain management.     Collaboration of Care: Collaboration of Care: Other patient established care with therapist.  Provided resources.  Patient/Guardian was advised Release of Information must be obtained prior to any record release in order to collaborate their care with an outside provider. Patient/Guardian was advised if they have not already done so to contact the registration department to sign all necessary forms in order for [place] to release information regarding their care.   Consent: Patient/Guardian gives verbal consent for treatment and assignment of benefits for services provided during this visit. Patient/Guardian expressed understanding and agreed to proceed.   This note was generated in part or whole with voice recognition software. Voice recognition is usually quite accurate but there are transcription errors that can and very often do occur. I apologize for any typographical errors that were not detected and corrected.    Jomarie Longs, MD 06/20/2023, 8:47 AM

## 2023-06-19 NOTE — Patient Instructions (Signed)
Please call for therapy MS. [facility] [address] [place], [place] [address]   [phone]

## 2023-07-01 NOTE — Progress Notes (Signed)
THERAPIST PROGRESS NOTE  Session Time: 3:03PM-4:03PM  Participation Level: Active  Behavioral Response: Casual, Neat, and Well GroomedAlertDepressed  Type of Therapy: Individual Therapy  Treatment Goals addressed:  Report a decrease in anxiety symptoms as evidenced by an overall reduction in anxiety score by a minimum of 25% on the Generalized Anxiety Disorder Scale (GAD-7).   Reduce frequency, intensity, and duration of depression symptoms so that daily functioning is improved   ProgressTowards Goals: Progressing  Interventions: CBT, DBT, Solution Focused, Strength-based, and Other: ACT  Summary: Aaron Nichols is a 52 y.o. male who presents with mixed sxs of anxiety and depression including but not limited to lack of motivation, lack of interest, fatigue, depressed mood, worry, difficulty controlling worry, and negative self affect. Pt oriented to person, place, and time. Pt denies SI/HI or A/V hallucinations. Pt was cooperative during visit and was engaged throughout the visit. Pt does not report any other concerns at the time of visit.  Pt reported that he has been feeling isolated due to difficulty being around people when pt feels he is not himself. Pt engaged in comparison of his problems saying that he overcame a brain tumor, so why not this. Discussed difference in nature of problems and contributing factors. Pt reported feeling that his doctor gave up before they started.   Pt reported feeling that his wife is supportive and also expects what works for her to work for pt. Pt reported that he finds himself apologizing often for not being where his wife is. Practiced conversational skills to assist pt in engaging in gratitude and self advocacy. Discussed difference between when to use gratitude and when to use apologizing.   Pt reported that if he cannot complete a task he often does not start a task. Discussed consequences these actions may have. Discussed pt preferences on  helping vs not trying and pt reported desire to help. Assisted pt in exploring what tasks pt feels comfortable assisting with. Pt identified making tea, cooking, grilling, and beginning some cleaning tasks to be activities he feels he can help with.   Discussed importance of having a schedule and invited pt to establish a schedule for self going forward. Pt reported improvements in practicing DBT distress tolerance skill of opposite action when facing debilitating sxs. Pt identified how they discerned need to utilize opposite action and explored ways they can and have practiced opposite action to assist in combating depressive sxs.    LCSW provided mood monitoring and treatment progress review in the context of this episode of treatment. LCSW reviewed the pt's mood status since last session.   Pt is continuing to apply interventions/techniques learned in session into daily life situations. Pt is currently on track to meet goals utilizing interventions that are discussed in session. Treatment to continue as indicated. Personal growth and progress toward goals noted above.  Continued Recommendations as followed: Self-care behaviors, positive social engagements, focusing on positive physical and emotional wellness, and focusing on life/work balance.   Suicidal/Homicidal: Nowithout intent/plan  Therapist Response:  Provided pt education re: acceptance. Discussed how to make acceptance accessible at all parts of pt's healing journey.    Approached pt with strengths based perspective to assist pt in exploring strengths in moments of feeling low.    LCSW practiced active listening to validate pt participation, build rapport, and create safe space for pt to feel heard as they are disclosing their thoughts and feelings.   LCSW utilized therapeutic conversation skills informed by CBT, DBT, and  ACT to expose pt to multiple ways of thinking about healing and to provide pt to access to multiple  interventions.  LCSW introduced pt to action planning. Pt practiced naming long term goal and worked with LCSW to define measurable "mile marker" goals that feel achievable to herself as she is now to assist her in pursuing her long term goal.   LCSW introduced pt to Acceptance and Commitment Therapy. Pt engaged in discussion on how to explore what they must accept in order to commit to what they have identified as important. Introduced pt to values directed goal exploration in order to identify goals of importance.    Plan: Return again in 3 weeks.  Diagnosis: GAD (generalized anxiety disorder)     06/19/2023    3:42 PM 06/04/2023    3:59 PM 04/09/2023    1:59 PM  Depression screen PHQ 2/9  Decreased Interest 3 3 3   Down, Depressed, Hopeless 3 3 2   PHQ - 2 Score 6 6 5   Altered sleeping 2 3 3   Tired, decreased energy  3 2  Change in appetite 0 1 2  Feeling bad or failure about yourself  3 3 3   Trouble concentrating 2 3 3   Moving slowly or fidgety/restless 2 2 3   Suicidal thoughts 0 0 0  PHQ-9 Score 15 21 21   Difficult doing work/chores Extremely dIfficult Extremely dIfficult Very difficult      06/19/2023    3:43 PM 06/04/2023    4:00 PM 04/09/2023    2:00 PM 01/30/2023    2:37 PM  GAD 7 : Generalized Anxiety Score  Nervous, Anxious, on Edge 2 3 3 3   Control/stop worrying 2 2 3 2   Worry too much - different things 2 2 3 2   Trouble relaxing 3 3 3 3   Restless 0 3 3 3   Easily annoyed or irritable 3 1 1 3   Afraid - awful might happen 2 2 2 3   Total GAD 7 Score 14 16 18 19   Anxiety Difficulty Very difficult Extremely difficult Very difficult Very difficult      Collaboration of Care: Psychiatrist AEB Dr. Elna Breslow  Patient/Guardian was advised Release of Information must be obtained prior to any record release in order to collaborate their care with an outside provider. Patient/Guardian was advised if they have not already done so to contact the registration department to sign  all necessary forms in order for [place] to release information regarding their care.   Consent: Patient/Guardian gives verbal consent for treatment and assignment of benefits for services provided during this visit. Patient/Guardian expressed understanding and agreed to proceed.   Geoffry Paradise, LCSW 07/01/2023

## 2023-07-02 NOTE — Progress Notes (Signed)
THERAPIST PROGRESS NOTE  Session Time: 3:10PM-3:48PM  Participation Level: Active  Behavioral Response: Casual, Neat, and Well GroomedAlertDepressed  Type of Therapy: Individual Therapy  Treatment Goals addressed:  Report a decrease in anxiety symptoms as evidenced by an overall reduction in anxiety score by a minimum of 25% on the Generalized Anxiety Disorder Scale (GAD-7).   Reduce frequency, intensity, and duration of depression symptoms so that daily functioning is improved   ProgressTowards Goals: Progressing  Interventions: CBT, DBT, Solution Focused, Strength-based, and Other: ACT  Virtual Visit via Video Note  I connected with Aaron Nichols on 06/18/2023 at  3:00 PM EDT by a video enabled telemedicine application and verified that I am speaking with the correct person using two identifiers.  Location: Patient: pt home Provider: working remotely in [place], [place]   I discussed the limitations of evaluation and management by telemedicine and the availability of in person appointments. The patient expressed understanding and agreed to proceed.  I discussed the assessment and treatment plan with the patient. The patient was provided an opportunity to ask questions and all were answered. The patient agreed with the plan and demonstrated an understanding of the instructions.   The patient was advised to call back or seek an in-person evaluation if the symptoms worsen or if the condition fails to improve as anticipated.  I provided 38 minutes of non-face-to-face time during this encounter.   Aaron Paradise, LCSW  Summary: Aaron Nichols is a 52 y.o. male who presents with mixed sxs of anxiety and depression including but not limited to lack of motivation, lack of interest, fatigue, depressed mood, worry, difficulty controlling worry, and negative self affect. Pt oriented to person, place, and time. Pt denies SI/HI or A/V hallucinations. Pt was cooperative during visit and was  engaged throughout the visit. Pt does not report any other concerns at the time of visit.  Pt reported that he has been feeling isolated due to difficulty being around people when pt feels he is not himself. Pt engaged in comparison of his problems saying that he overcame a brain tumor, so why not this. Discussed difference in nature of problems and contributing factors. Pt reported feeling that his doctor gave up before they started.   Pt reported feeling that his wife is supportive and also expects what works for her to work for pt. Pt reported that he finds himself apologizing often for not being where his wife is. Practiced conversational skills to assist pt in engaging in gratitude and self advocacy. Discussed difference between when to use gratitude and when to use apologizing.   Pt reported that if he cannot complete a task he often does not start a task. Discussed consequences these actions may have. Discussed pt preferences on helping vs not trying and pt reported desire to help. Assisted pt in exploring what tasks pt feels comfortable assisting with. Pt identified making tea, cooking, grilling, and beginning some cleaning tasks to be activities he feels he can help with.   Discussed importance of having a schedule and invited pt to establish a schedule for self going forward. Pt reported improvements in practicing DBT distress tolerance skill of opposite action when facing debilitating sxs. Pt identified how they discerned need to utilize opposite action and explored ways they can and have practiced opposite action to assist in combating depressive sxs.    LCSW provided mood monitoring and treatment progress review in the context of this episode of treatment. LCSW reviewed the pt's mood  status since last session.   Pt is continuing to apply interventions/techniques learned in session into daily life situations. Pt is currently on track to meet goals utilizing interventions that are discussed in  session. Treatment to continue as indicated. Personal growth and progress toward goals noted above.  Continued Recommendations as followed: Self-care behaviors, positive social engagements, focusing on positive physical and emotional wellness, and focusing on life/work balance.   Suicidal/Homicidal: Nowithout intent/plan  Therapist Response:  Provided pt education re: acceptance. Discussed how to make acceptance accessible at all parts of pt's healing journey.    Approached pt with strengths based perspective to assist pt in exploring strengths in moments of feeling low.    LCSW practiced active listening to validate pt participation, build rapport, and create safe space for pt to feel heard as they are disclosing their thoughts and feelings.   LCSW utilized therapeutic conversation skills informed by CBT, DBT, and ACT to expose pt to multiple ways of thinking about healing and to provide pt to access to multiple interventions.  LCSW introduced pt to action planning. Pt practiced naming long term goal and worked with LCSW to define measurable "mile marker" goals that feel achievable to herself as she is now to assist her in pursuing her long term goal.   LCSW introduced pt to Acceptance and Commitment Therapy. Pt engaged in discussion on how to explore what they must accept in order to commit to what they have identified as important. Introduced pt to values directed goal exploration in order to identify goals of importance.    Plan: Transfer to new therapist at [facility] after last session with LCSW  Diagnosis: Severe episode of recurrent major depressive disorder, without psychotic features (HCC)  GAD (generalized anxiety disorder)     06/19/2023    3:42 PM 06/04/2023    3:59 PM 04/09/2023    1:59 PM  Depression screen PHQ 2/9  Decreased Interest 3 3 3   Down, Depressed, Hopeless 3 3 2   PHQ - 2 Score 6 6 5   Altered sleeping 2 3 3   Tired, decreased energy  3 2  Change in appetite 0 1 2   Feeling bad or failure about yourself  3 3 3   Trouble concentrating 2 3 3   Moving slowly or fidgety/restless 2 2 3   Suicidal thoughts 0 0 0  PHQ-9 Score 15 21 21   Difficult doing work/chores Extremely dIfficult Extremely dIfficult Very difficult      06/19/2023    3:43 PM 06/04/2023    4:00 PM 04/09/2023    2:00 PM 01/30/2023    2:37 PM  GAD 7 : Generalized Anxiety Score  Nervous, Anxious, on Edge 2 3 3 3   Control/stop worrying 2 2 3 2   Worry too much - different things 2 2 3 2   Trouble relaxing 3 3 3 3   Restless 0 3 3 3   Easily annoyed or irritable 3 1 1 3   Afraid - awful might happen 2 2 2 3   Total GAD 7 Score 14 16 18 19   Anxiety Difficulty Very difficult Extremely difficult Very difficult Very difficult      Collaboration of Care: Psychiatrist AEB Dr. Elna Breslow  Patient/Guardian was advised Release of Information must be obtained prior to any record release in order to collaborate their care with an outside provider. Patient/Guardian was advised if they have not already done so to contact the registration department to sign all necessary forms in order for [place] to release information regarding their care.  Consent: Patient/Guardian gives verbal consent for treatment and assignment of benefits for services provided during this visit. Patient/Guardian expressed understanding and agreed to proceed.   Memory Dance Hondo Nanda, LCSW

## 2023-07-02 NOTE — Progress Notes (Signed)
THERAPIST PROGRESS NOTE  Session Time: 2:05PM-2:55PM  Participation Level: Active  Behavioral Response: Casual, Neat, and Well GroomedAlertDepressed  Type of Therapy: Individual Therapy  Treatment Goals addressed:  Report a decrease in anxiety symptoms as evidenced by an overall reduction in anxiety score by a minimum of 25% on the Generalized Anxiety Disorder Scale (GAD-7).   Reduce frequency, intensity, and duration of depression symptoms so that daily functioning is improved   ProgressTowards Goals: Progressing  Interventions: CBT, DBT, Solution Focused, Strength-based, and Other: ACT  Virtual Visit via Video Note  I connected with Aaron Nichols on 05/13/2023 at  2:00 PM EDT by a video enabled telemedicine application and verified that I am speaking with the correct person using two identifiers.  Location: Patient: located in pt home Provider: [facility]   I discussed the limitations of evaluation and management by telemedicine and the availability of in person appointments. The patient expressed understanding and agreed to proceed.  I discussed the assessment and treatment plan with the patient. The patient was provided an opportunity to ask questions and all were answered. The patient agreed with the plan and demonstrated an understanding of the instructions.   The patient was advised to call back or seek an in-person evaluation if the symptoms worsen or if the condition fails to improve as anticipated.  I provided 50 minutes of non-face-to-face time during this encounter.   Aaron Paradise, LCSW  Summary: Aaron Nichols is a 52 y.o. male who presents with mixed sxs of anxiety and depression including but not limited to lack of motivation, lack of interest, fatigue, depressed mood, worry, difficulty controlling worry, and negative self affect. Pt oriented to person, place, and time. Pt denies SI/HI or A/V hallucinations. Pt was cooperative during visit and was engaged throughout  the visit. Pt does not report any other concerns at the time of visit.  Pt reported that he has been feeling isolated due to difficulty being around people when pt feels he is not himself. Pt engaged in comparison of his problems saying that he overcame a brain tumor, so why not this. Discussed difference in nature of problems and contributing factors. Pt reported feeling that his doctor gave up before they started.   Pt reported feeling that his wife is supportive and also expects what works for her to work for pt. Pt reported that he finds himself apologizing often for not being where his wife is. Practiced conversational skills to assist pt in engaging in gratitude and self advocacy. Discussed difference between when to use gratitude and when to use apologizing.   Pt reported that if he cannot complete a task he often does not start a task. Discussed consequences these actions may have. Discussed pt preferences on helping vs not trying and pt reported desire to help. Assisted pt in exploring what tasks pt feels comfortable assisting with. Pt identified making tea, cooking, grilling, and beginning some cleaning tasks to be activities he feels he can help with.   Discussed importance of having a schedule and invited pt to establish a schedule for self going forward. Pt reported improvements in practicing DBT distress tolerance skill of opposite action when facing debilitating sxs. Pt identified how they discerned need to utilize opposite action and explored ways they can and have practiced opposite action to assist in combating depressive sxs.    LCSW provided mood monitoring and treatment progress review in the context of this episode of treatment. LCSW reviewed the pt's mood status since  last session.   Pt is continuing to apply interventions/techniques learned in session into daily life situations. Pt is currently on track to meet goals utilizing interventions that are discussed in session. Treatment  to continue as indicated. Personal growth and progress toward goals noted above.  Continued Recommendations as followed: Self-care behaviors, positive social engagements, focusing on positive physical and emotional wellness, and focusing on life/work balance.   Suicidal/Homicidal: Nowithout intent/plan  Therapist Response:  Provided pt education re: acceptance. Discussed how to make acceptance accessible at all parts of pt's healing journey.    Approached pt with strengths based perspective to assist pt in exploring strengths in moments of feeling low.    LCSW practiced active listening to validate pt participation, build rapport, and create safe space for pt to feel heard as they are disclosing their thoughts and feelings.   LCSW utilized therapeutic conversation skills informed by CBT, DBT, and ACT to expose pt to multiple ways of thinking about healing and to provide pt to access to multiple interventions.  LCSW introduced pt to action planning. Pt practiced naming long term goal and worked with LCSW to define measurable "mile marker" goals that feel achievable to herself as she is now to assist her in pursuing her long term goal.   LCSW introduced pt to Acceptance and Commitment Therapy. Pt engaged in discussion on how to explore what they must accept in order to commit to what they have identified as important. Introduced pt to values directed goal exploration in order to identify goals of importance.    Plan: Return again in 2 weeks.  Diagnosis: GAD (generalized anxiety disorder)  Severe episode of recurrent major depressive disorder, without psychotic features (HCC)     06/19/2023    3:42 PM 06/04/2023    3:59 PM 04/09/2023    1:59 PM  Depression screen PHQ 2/9  Decreased Interest 3 3 3   Down, Depressed, Hopeless 3 3 2   PHQ - 2 Score 6 6 5   Altered sleeping 2 3 3   Tired, decreased energy  3 2  Change in appetite 0 1 2  Feeling bad or failure about yourself  3 3 3   Trouble  concentrating 2 3 3   Moving slowly or fidgety/restless 2 2 3   Suicidal thoughts 0 0 0  PHQ-9 Score 15 21 21   Difficult doing work/chores Extremely dIfficult Extremely dIfficult Very difficult      06/19/2023    3:43 PM 06/04/2023    4:00 PM 04/09/2023    2:00 PM 01/30/2023    2:37 PM  GAD 7 : Generalized Anxiety Score  Nervous, Anxious, on Edge 2 3 3 3   Control/stop worrying 2 2 3 2   Worry too much - different things 2 2 3 2   Trouble relaxing 3 3 3 3   Restless 0 3 3 3   Easily annoyed or irritable 3 1 1 3   Afraid - awful might happen 2 2 2 3   Total GAD 7 Score 14 16 18 19   Anxiety Difficulty Very difficult Extremely difficult Very difficult Very difficult      Collaboration of Care: Psychiatrist AEB Dr. Elna Breslow  Patient/Guardian was advised Release of Information must be obtained prior to any record release in order to collaborate their care with an outside provider. Patient/Guardian was advised if they have not already done so to contact the registration department to sign all necessary forms in order for [place] to release information regarding their care.   Consent: Patient/Guardian gives verbal consent for treatment and  assignment of benefits for services provided during this visit. Patient/Guardian expressed understanding and agreed to proceed.   Memory Dance Devine Dant, LCSW

## 2023-07-02 NOTE — Progress Notes (Signed)
THERAPIST PROGRESS NOTE  Session Time: 3:05PM-3:58PM  Participation Level: Active  Behavioral Response: Casual, Neat, and Well GroomedAlertDepressed  Type of Therapy: Individual Therapy  Treatment Goals addressed:  Report a decrease in anxiety symptoms as evidenced by an overall reduction in anxiety score by a minimum of 25% on the Generalized Anxiety Disorder Scale (GAD-7).   Reduce frequency, intensity, and duration of depression symptoms so that daily functioning is improved   ProgressTowards Goals: Progressing  Interventions: CBT, DBT, Solution Focused, Strength-based, and Other: ACT  Summary: Aaron FINERAN is a 52 y.o. male who presents with mixed sxs of anxiety and depression including but not limited to lack of motivation, lack of interest, fatigue, depressed mood, worry, difficulty controlling worry, and negative self affect. Pt oriented to person, place, and time. Pt denies SI/HI or A/V hallucinations. Pt was cooperative during visit and was engaged throughout the visit. Pt does not report any other concerns at the time of visit.  Pt reported that he has been feeling isolated due to difficulty being around people when pt feels he is not himself. Pt engaged in comparison of his problems saying that he overcame a brain tumor, so why not this. Discussed difference in nature of problems and contributing factors. Pt reported feeling that his doctor gave up before they started.   Pt reported feeling that his wife is supportive and also expects what works for her to work for pt. Pt reported that he finds himself apologizing often for not being where his wife is. Practiced conversational skills to assist pt in engaging in gratitude and self advocacy. Discussed difference between when to use gratitude and when to use apologizing.   Pt reported that if he cannot complete a task he often does not start a task. Discussed consequences these actions may have. Discussed pt preferences on  helping vs not trying and pt reported desire to help. Assisted pt in exploring what tasks pt feels comfortable assisting with. Pt identified making tea, cooking, grilling, and beginning some cleaning tasks to be activities he feels he can help with.   Discussed importance of having a schedule and invited pt to establish a schedule for self going forward. Pt reported improvements in practicing DBT distress tolerance skill of opposite action when facing debilitating sxs. Pt identified how they discerned need to utilize opposite action and explored ways they can and have practiced opposite action to assist in combating depressive sxs.    LCSW provided mood monitoring and treatment progress review in the context of this episode of treatment. LCSW reviewed the pt's mood status since last session.   Pt is continuing to apply interventions/techniques learned in session into daily life situations. Pt is currently on track to meet goals utilizing interventions that are discussed in session. Treatment to continue as indicated. Personal growth and progress toward goals noted above.  Continued Recommendations as followed: Self-care behaviors, positive social engagements, focusing on positive physical and emotional wellness, and focusing on life/work balance.   Suicidal/Homicidal: Nowithout intent/plan  Therapist Response:  Provided pt education re: acceptance. Discussed how to make acceptance accessible at all parts of pt's healing journey.    Approached pt with strengths based perspective to assist pt in exploring strengths in moments of feeling low.    LCSW practiced active listening to validate pt participation, build rapport, and create safe space for pt to feel heard as they are disclosing their thoughts and feelings.   LCSW utilized therapeutic conversation skills informed by CBT, DBT, and  ACT to expose pt to multiple ways of thinking about healing and to provide pt to access to multiple  interventions.  LCSW introduced pt to action planning. Pt practiced naming long term goal and worked with LCSW to define measurable "mile marker" goals that feel achievable to herself as she is now to assist her in pursuing her long term goal.   LCSW introduced pt to Acceptance and Commitment Therapy. Pt engaged in discussion on how to explore what they must accept in order to commit to what they have identified as important. Introduced pt to values directed goal exploration in order to identify goals of importance.    Plan: Return again in 2 weeks. Transfer to new therapist at [facility] after last session with LCSW  Diagnosis: Severe episode of recurrent major depressive disorder, without psychotic features (HCC)  GAD (generalized anxiety disorder)     06/19/2023    3:42 PM 06/04/2023    3:59 PM 04/09/2023    1:59 PM  Depression screen PHQ 2/9  Decreased Interest 3 3 3   Down, Depressed, Hopeless 3 3 2   PHQ - 2 Score 6 6 5   Altered sleeping 2 3 3   Tired, decreased energy  3 2  Change in appetite 0 1 2  Feeling bad or failure about yourself  3 3 3   Trouble concentrating 2 3 3   Moving slowly or fidgety/restless 2 2 3   Suicidal thoughts 0 0 0  PHQ-9 Score 15 21 21   Difficult doing work/chores Extremely dIfficult Extremely dIfficult Very difficult      06/19/2023    3:43 PM 06/04/2023    4:00 PM 04/09/2023    2:00 PM 01/30/2023    2:37 PM  GAD 7 : Generalized Anxiety Score  Nervous, Anxious, on Edge 2 3 3 3   Control/stop worrying 2 2 3 2   Worry too much - different things 2 2 3 2   Trouble relaxing 3 3 3 3   Restless 0 3 3 3   Easily annoyed or irritable 3 1 1 3   Afraid - awful might happen 2 2 2 3   Total GAD 7 Score 14 16 18 19   Anxiety Difficulty Very difficult Extremely difficult Very difficult Very difficult      Collaboration of Care: Psychiatrist AEB Dr. Elna Breslow  Patient/Guardian was advised Release of Information must be obtained prior to any record release in order to  collaborate their care with an outside provider. Patient/Guardian was advised if they have not already done so to contact the registration department to sign all necessary forms in order for [place] to release information regarding their care.   Consent: Patient/Guardian gives verbal consent for treatment and assignment of benefits for services provided during this visit. Patient/Guardian expressed understanding and agreed to proceed.   Memory Dance Orvella Digiulio, LCSW

## 2023-07-02 NOTE — Progress Notes (Signed)
THERAPIST PROGRESS NOTE  Session Time: 1:02PM-1:58PM  Participation Level: Active  Behavioral Response: Casual, Neat, and Well GroomedAlertDepressed  Type of Therapy: Individual Therapy  Treatment Goals addressed:  Report a decrease in anxiety symptoms as evidenced by an overall reduction in anxiety score by a minimum of 25% on the Generalized Anxiety Disorder Scale (GAD-7).   Reduce frequency, intensity, and duration of depression symptoms so that daily functioning is improved   ProgressTowards Goals: Progressing  Interventions: CBT, DBT, Solution Focused, Strength-based, and Other: ACT  Summary: Aaron Nichols is a 52 y.o. male who presents with mixed sxs of anxiety and depression including but not limited to lack of motivation, lack of interest, fatigue, depressed mood, worry, difficulty controlling worry, and negative self affect. Pt oriented to person, place, and time. Pt denies SI/HI or A/V hallucinations. Pt was cooperative during visit and was engaged throughout the visit. Pt does not report any other concerns at the time of visit.  Pt reported that he has been feeling isolated due to difficulty being around people when pt feels he is not himself. Pt engaged in comparison of his problems saying that he overcame a brain tumor, so why not this. Discussed difference in nature of problems and contributing factors. Pt reported feeling that his doctor gave up before they started.   Pt reported feeling that his wife is supportive and also expects what works for her to work for pt. Pt reported that he finds himself apologizing often for not being where his wife is. Practiced conversational skills to assist pt in engaging in gratitude and self advocacy. Discussed difference between when to use gratitude and when to use apologizing.   Pt reported that if he cannot complete a task he often does not start a task. Discussed consequences these actions may have. Discussed pt preferences on  helping vs not trying and pt reported desire to help. Assisted pt in exploring what tasks pt feels comfortable assisting with. Pt identified making tea, cooking, grilling, and beginning some cleaning tasks to be activities he feels he can help with.   Discussed importance of having a schedule and invited pt to establish a schedule for self going forward. Pt reported improvements in practicing DBT distress tolerance skill of opposite action when facing debilitating sxs. Pt identified how they discerned need to utilize opposite action and explored ways they can and have practiced opposite action to assist in combating depressive sxs.    LCSW provided mood monitoring and treatment progress review in the context of this episode of treatment. LCSW reviewed the pt's mood status since last session.   Pt is continuing to apply interventions/techniques learned in session into daily life situations. Pt is currently on track to meet goals utilizing interventions that are discussed in session. Treatment to continue as indicated. Personal growth and progress toward goals noted above.  Continued Recommendations as followed: Self-care behaviors, positive social engagements, focusing on positive physical and emotional wellness, and focusing on life/work balance.   Suicidal/Homicidal: Nowithout intent/plan  Therapist Response:  Provided pt education re: acceptance. Discussed how to make acceptance accessible at all parts of pt's healing journey.    Approached pt with strengths based perspective to assist pt in exploring strengths in moments of feeling low.    LCSW practiced active listening to validate pt participation, build rapport, and create safe space for pt to feel heard as they are disclosing their thoughts and feelings.   LCSW utilized therapeutic conversation skills informed by CBT, DBT, and  ACT to expose pt to multiple ways of thinking about healing and to provide pt to access to multiple  interventions.  LCSW introduced pt to action planning. Pt practiced naming long term goal and worked with LCSW to define measurable "mile marker" goals that feel achievable to herself as she is now to assist her in pursuing her long term goal.   LCSW introduced pt to Acceptance and Commitment Therapy. Pt engaged in discussion on how to explore what they must accept in order to commit to what they have identified as important. Introduced pt to values directed goal exploration in order to identify goals of importance.    Plan: Return again in 3 weeks.  Diagnosis: GAD (generalized anxiety disorder)  Current severe episode of major depressive disorder without psychotic features without prior episode (HCC)     06/19/2023    3:42 PM 06/04/2023    3:59 PM 04/09/2023    1:59 PM  Depression screen PHQ 2/9  Decreased Interest 3 3 3   Down, Depressed, Hopeless 3 3 2   PHQ - 2 Score 6 6 5   Altered sleeping 2 3 3   Tired, decreased energy  3 2  Change in appetite 0 1 2  Feeling bad or failure about yourself  3 3 3   Trouble concentrating 2 3 3   Moving slowly or fidgety/restless 2 2 3   Suicidal thoughts 0 0 0  PHQ-9 Score 15 21 21   Difficult doing work/chores Extremely dIfficult Extremely dIfficult Very difficult      06/19/2023    3:43 PM 06/04/2023    4:00 PM 04/09/2023    2:00 PM 01/30/2023    2:37 PM  GAD 7 : Generalized Anxiety Score  Nervous, Anxious, on Edge 2 3 3 3   Control/stop worrying 2 2 3 2   Worry too much - different things 2 2 3 2   Trouble relaxing 3 3 3 3   Restless 0 3 3 3   Easily annoyed or irritable 3 1 1 3   Afraid - awful might happen 2 2 2 3   Total GAD 7 Score 14 16 18 19   Anxiety Difficulty Very difficult Extremely difficult Very difficult Very difficult      Collaboration of Care: Psychiatrist AEB Dr. Elna Breslow  Patient/Guardian was advised Release of Information must be obtained prior to any record release in order to collaborate their care with an outside provider.  Patient/Guardian was advised if they have not already done so to contact the registration department to sign all necessary forms in order for [place] to release information regarding their care.   Consent: Patient/Guardian gives verbal consent for treatment and assignment of benefits for services provided during this visit. Patient/Guardian expressed understanding and agreed to proceed.   Memory Dance Manson [place], LCSW

## 2023-07-22 ENCOUNTER — Telehealth: Payer: BC Managed Care – PPO | Admitting: Psychiatry

## 2023-08-01 ENCOUNTER — Telehealth: Payer: BC Managed Care – PPO | Admitting: Psychiatry

## 2023-08-01 ENCOUNTER — Encounter: Payer: Self-pay | Admitting: Psychiatry

## 2023-08-01 DIAGNOSIS — G4701 Insomnia due to medical condition: Secondary | ICD-10-CM

## 2023-08-01 DIAGNOSIS — F3341 Major depressive disorder, recurrent, in partial remission: Secondary | ICD-10-CM

## 2023-08-01 DIAGNOSIS — F411 Generalized anxiety disorder: Secondary | ICD-10-CM | POA: Diagnosis not present

## 2023-08-01 MED ORDER — SERTRALINE HCL 100 MG PO TABS: 100.0000 mg | ORAL_TABLET | Freq: Every day | ORAL | Status: DC

## 2023-08-01 MED ORDER — QUETIAPINE FUMARATE 25 MG PO TABS: 25.0000 mg | ORAL_TABLET | Freq: Every day | ORAL | 4 refills | Status: DC
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
## 2023-08-21 ENCOUNTER — Other Ambulatory Visit: Payer: Self-pay | Admitting: Psychiatry

## 2023-08-21 DIAGNOSIS — F332 Major depressive disorder, recurrent severe without psychotic features: Secondary | ICD-10-CM

## 2023-08-22 ENCOUNTER — Telehealth: Payer: Self-pay | Admitting: Psychiatry

## 2023-08-22 NOTE — Telephone Encounter (Signed)
Patient called and requested a refill me sent to his pharmacy for Buspirone medication. Patient's next appointment is 12/02/2023.-Please Advise

## 2023-08-23 ENCOUNTER — Other Ambulatory Visit: Payer: Self-pay | Admitting: Psychiatry

## 2023-08-23 ENCOUNTER — Telehealth: Payer: Self-pay

## 2023-08-23 DIAGNOSIS — F332 Major depressive disorder, recurrent severe without psychotic features: Secondary | ICD-10-CM

## 2023-08-23 MED ORDER — BUSPIRONE HCL 10 MG PO TABS: 20.0000 mg | ORAL_TABLET | Freq: Two times a day (BID) | ORAL | 0 refills | Status: DC

## 2023-08-23 NOTE — Telephone Encounter (Signed)
called pharmacy and spoke with nadie she states that pt did pick up rx on 10-3 and that he is due and if they can get a 90 day supply sent in   Pt has already been told to check with his pharmacy later today.

## 2023-08-23 NOTE — Telephone Encounter (Signed)
left message notifying patient

## 2023-08-23 NOTE — Telephone Encounter (Signed)
pt called states he only got 30 day supply and that it was not refills and if you don't refill he would just stop taking the medication. The buspar

## 2023-08-23 NOTE — Telephone Encounter (Signed)
called pharmacy and spoke with nadie she states that pt did pick up rx on 10-3 and that he is due and if they can get a 90 day supply sent in

## 2023-08-23 NOTE — Telephone Encounter (Signed)
I have sent another prescription for BuSpar.

## 2023-08-23 NOTE — Telephone Encounter (Signed)
Buspar was already sent to pharmacy for 90 days supply in August and he will only be due end of November.  It is too early to send this prescription.  Please advise patient to request the medication 10 days prior to running out, he can make a request through my chart or he can make a request through his pharmacy.- Receipt confirmed by pharmacy (06/19/2023  3:55 PM EDT

## 2023-08-23 NOTE — Telephone Encounter (Signed)
This was already sent.

## 2023-08-23 NOTE — Telephone Encounter (Signed)
called pharmacy they states that the rx that they received was only for #120 pils and that they don't have a rx for a 90 day supply. states that they filled 120 pills on 10-20.

## 2023-08-23 NOTE — Telephone Encounter (Signed)
notified pt that according to the pharmacy he was given 120 pill on 10-20 pt states no that he last got filled on the 10-2 and he only has 2 pills left. i will call pharmacy back and make sure that this info is correct

## 2023-09-24 ENCOUNTER — Other Ambulatory Visit: Payer: Self-pay | Admitting: Psychiatry

## 2023-09-24 DIAGNOSIS — F411 Generalized anxiety disorder: Secondary | ICD-10-CM

## 2023-11-25 ENCOUNTER — Other Ambulatory Visit: Payer: Self-pay | Admitting: Psychiatry

## 2023-11-25 DIAGNOSIS — F332 Major depressive disorder, recurrent severe without psychotic features: Secondary | ICD-10-CM

## 2023-11-28 DIAGNOSIS — J441 Chronic obstructive pulmonary disease with (acute) exacerbation: Secondary | ICD-10-CM | POA: Diagnosis not present

## 2023-11-28 DIAGNOSIS — J069 Acute upper respiratory infection, unspecified: Secondary | ICD-10-CM | POA: Diagnosis not present

## 2023-12-02 ENCOUNTER — Telehealth (INDEPENDENT_AMBULATORY_CARE_PROVIDER_SITE_OTHER): Payer: Medicare HMO | Admitting: Psychiatry

## 2023-12-02 ENCOUNTER — Encounter: Payer: Self-pay | Admitting: Psychiatry

## 2023-12-02 DIAGNOSIS — F411 Generalized anxiety disorder: Secondary | ICD-10-CM | POA: Diagnosis not present

## 2023-12-02 DIAGNOSIS — G4701 Insomnia due to medical condition: Secondary | ICD-10-CM

## 2023-12-02 DIAGNOSIS — F3341 Major depressive disorder, recurrent, in partial remission: Secondary | ICD-10-CM

## 2023-12-02 NOTE — Progress Notes (Signed)
 Virtual Visit via Video Note  I connected with Aaron Nichols on 12/02/23 at 11:00 AM EST by a video enabled telemedicine application and verified that I am speaking with the correct person using two identifiers.  Location Provider Location : [facility] Patient Location : Home  Participants: Patient , Provider    I discussed the limitations of evaluation and management by telemedicine and the availability of in person appointments. The patient expressed understanding and agreed to proceed.   I discussed the assessment and treatment plan with the patient. The patient was provided an opportunity to ask questions and all were answered. The patient agreed with the plan and demonstrated an understanding of the instructions.   The patient was advised to call back or seek an in-person evaluation if the symptoms worsen or if the condition fails to improve as anticipated.   BH MD OP Progress Note  12/02/2023 11:10 AM Aaron Nichols  MRN:  [id number]  Chief Complaint:  Chief Complaint  Patient presents with   Follow-up   Anxiety   Depression   Medication Refill   HPI: Aaron Nichols is a 53 year old Caucasian male, currently on disability, married, lives in [place], has a history of MDD, GAD, insomnia, history of brain tumor, calcific neuroma, chronic face cramping, numbness, tingling in right foot, hemifacial paralysis, back pain, was evaluated by telemedicine today.  Patient was scheduled to have an in-office visit today however contacted the office stating he is sick with a viral infection and wanted to do a virtual appointment and hence it was changed to a virtual.  Patient has a history of depression and anxiety, managed with Buspar  20 mg twice daily, Zoloft  100 mg daily, and Seroquel  25 mg at bedtime. He experiences ongoing feelings of hopelessness and lack of motivation, attributing some of this to his current illness. He is not currently seeing a therapist, as the one he intended to see is  retiring soon. He has been approved for disability and now has [employer], which has been a relief after a long wait.  He is experiencing a viral infection with symptoms primarily affecting his lungs, leading to significant breathing difficulties. Coughing has occasionally caused vomiting, and he was unable to speak for about a week due to the severity of his symptoms. He is currently on day five of a prescribed antibiotic course. He describes the breathing difficulties as 'a little scary.'  He has encountered issues with obtaining his medication, specifically Buspar , which he has not taken for nine days due to a pharmacy issue with filling a 90-day supply. He is on a dosage of 10 mg, taking two tablets twice daily. He plans to contact the pharmacy to resolve this issue.  Patient currently denies any suicidality, homicidality or perceptual disturbances.   Visit Diagnosis:    ICD-10-CM   1. Recurrent major depressive disorder, in partial remission (HCC)  F33.41     2. GAD (generalized anxiety disorder)  F41.1     3. Insomnia due to medical condition  G47.01    pain, viral infection      Past Psychiatric History: I have reviewed past psychiatric history from progress note on 10/31/2022.  Past trials of medications like duloxetine.  Past Medical History:  Past Medical History:  Diagnosis Date   Abnormal LFTs (liver function tests)    Arthritis    BP (high blood pressure) 01/26/2015   Brain tumor (HCC)    acoustic neuroma   Complication of anesthesia 10/22/2006  brain tumor 15 hours surgery resulting nausea and vomiting for severeal days   Complication of anesthesia    pt. woke up before extubation with cystoscopy- pt not aware of this   GERD (gastroesophageal reflux disease)    Hydronephrosis with urinary obstruction due to ureteral calculus 07/03/2015   Hypertension    Infection of the upper respiratory tract 04/22/2015   Kidney stone 10/22/1988   PONV  (postoperative nausea and vomiting)    Right ureteral stone 07/03/2015   Right-sided Bell's palsy 10/22/2006   nerve fatigue    Past Surgical History:  Procedure Laterality Date   BRAIN TUMOR EXCISION  2008   CHOLECYSTECTOMY  2022   CYSTOSCOPY W/ RETROGRADES Left 09/07/2015   Procedure: CYSTOSCOPY WITH RETROGRADE PYELOGRAM;  Surgeon: Bart Born, MD;  Location: [facility];  Service: Urology;  Laterality: Left;   CYSTOSCOPY W/ RETROGRADES Left 10/03/2015   Procedure: CYSTOSCOPY WITH RETROGRADE PYELOGRAM;  Surgeon: Bart Born, MD;  Location: [facility];  Service: Urology;  Laterality: Left;   CYSTOSCOPY WITH STENT PLACEMENT Right 07/06/2015   Procedure: CYSTOSCOPY WITH STENT PLACEMENT;  Surgeon: Michelina Aho, MD;  Location: [facility];  Service: Urology;  Laterality: Right;   CYSTOSCOPY WITH STENT PLACEMENT Left 09/07/2015   Procedure: CYSTOSCOPY WITH STENT PLACEMENT;  Surgeon: Bart Born, MD;  Location: [facility];  Service: Urology;  Laterality: Left;   CYSTOSCOPY WITH STENT PLACEMENT Left 10/03/2015   Procedure: CYSTOSCOPY WITH STENT PLACEMENT/ EXCHANGE/STONE BASKETING;  Surgeon: Bart Born, MD;  Location: [facility];  Service: Urology;  Laterality: Left;   ELBOW FRACTURE SURGERY Left 10/22/1996   INCISION AND DRAINAGE ABSCESS  2022   pt states this abscess was between his scrotum and anus   LUMBAR LAMINECTOMY/DECOMPRESSION MICRODISCECTOMY N/A 01/11/2022   Procedure: Microlumbar decompression Lumbar three-four;  Surgeon: Orvan Blanch, MD;  Location: [facility] OR;  Service: Orthopedics;  Laterality: N/A;   URETEROSCOPY WITH HOLMIUM LASER LITHOTRIPSY Right 07/06/2015   Procedure: URETEROSCOPY ;  Surgeon: Michelina Aho, MD;  Location: [facility];  Service: Urology;  Laterality: Right;   URETEROSCOPY WITH HOLMIUM LASER LITHOTRIPSY Right 07/25/2015   Procedure: URETEROSCOPY WITH HOLMIUM LASER LITHOTRIPSY, CYSTOSCOPY,  STENT REMOVAL ;  Surgeon: Michelina Aho, MD;  Location:  [facility];  Service: Urology;  Laterality: Right;   URETEROSCOPY WITH HOLMIUM LASER LITHOTRIPSY Left 09/07/2015   Procedure: URETEROSCOPY;  Surgeon: Bart Born, MD;  Location: [facility];  Service: Urology;  Laterality: Left;   URETEROSCOPY WITH HOLMIUM LASER LITHOTRIPSY Left 10/03/2015   Procedure: URETEROSCOPY WITH HOLMIUM LASER LITHOTRIPSY;  Surgeon: Bart Born, MD;  Location: [facility];  Service: Urology;  Laterality: Left;    Family Psychiatric History: I have reviewed family psychiatric history from progress note on 10/31/2022.  Family History:  Family History  Problem Relation Age of Onset   Schizophrenia Mother    Alcohol abuse Father    Heart disease Father    Drug abuse Daughter     Social History: I have reviewed social history from progress note on 10/31/2022. Social History   Socioeconomic History   Marital status: Married    Spouse name: Not on file   Number of children: 1   Years of education: Not on file   Highest education level: Some college, no degree  Occupational History   Not on file  Tobacco Use   Smoking status: Former    Current packs/day: 0.00    Types: Cigarettes    Quit date: 10/13/2021  Years since quitting: 2.1   Smokeless tobacco: Never  Vaping Use   Vaping status: Never Used  Substance and Sexual Activity   Alcohol use: No   Drug use: No   Sexual activity: Not Currently  Other Topics Concern   Not on file  Social History Narrative   Not on file   Social Drivers of Health   Financial Resource Strain: Medium Risk (12/09/2020)   [facility]   Overall Financial Resource Strain (CARDIA)    Difficulty of Paying Living Expenses: Somewhat hard  Food Insecurity: Food Insecurity Present (12/09/2020)   [facility], [facility]   Hunger Vital Sign    Worried About Running Out of Food in the Last Year: Sometimes true    Ran Out of Food in the Last Year: Sometimes true   Transportation Needs: No Transportation Needs (12/09/2020)   Received from [facility], [facility]   [facility] - Transportation    Lack of Transportation (Medical): No    Lack of Transportation (Non-Medical): No  Physical Activity: Not on file  Stress: Not on file  Social Connections: Not on file    Allergies:  Allergies  Allergen Reactions   Ceftin  [Cefuroxime  Axetil]     Dizziness,lightheaded   Prednisone Other (See Comments)    Metabolic Disorder Labs: No results found for: "HGBA1C", "MPG" No results found for: "PROLACTIN" No results found for: "CHOL", "TRIG", "HDL", "CHOLHDL", "VLDL", "LDLCALC" Lab Results  Component Value Date   TSH 1.925 11/26/2022    Therapeutic Level Labs: No results found for: "LITHIUM" No results found for: "VALPROATE" No results found for: "CBMZ"  Current Medications: Current Outpatient Medications  Medication Sig Dispense Refill   amLODipine  (NORVASC ) 5 MG tablet Take 5 mg by mouth daily.     atorvastatin (LIPITOR) 20 MG tablet Take 20 mg by mouth daily.     busPIRone  (BUSPAR ) 10 MG tablet TAKE 2 TABLETS(20 MG) BY MOUTH TWICE DAILY. 360 tablet 0   levocetirizine (XYZAL) 5 MG tablet Take 5 mg by mouth every evening. (Patient not taking: Reported on 08/01/2023)     lisinopril  (ZESTRIL ) 10 MG tablet Take 10 mg by mouth daily.     omeprazole (PRILOSEC) 20 MG capsule Take 20 mg by mouth daily.     oxyCODONE  (OXY IR/ROXICODONE ) 5 MG immediate release tablet Takes 5 mg twice daily     polyethylene glycol (MIRALAX  / GLYCOLAX ) 17 g packet Take 17 g by mouth daily. 14 each 0   propranolol  (INDERAL ) 10 MG tablet Take 1 tablet (10 mg total) by mouth 2 (two) times daily as needed. 60 tablet 2   QUEtiapine  (SEROQUEL ) 25 MG tablet Take 1 tablet (25 mg total) by mouth at bedtime. 30 tablet 4   sertraline  (ZOLOFT ) 100 MG tablet Take 1 tablet (100 mg total) by mouth daily. 90 tablet 1   No current facility-administered medications for this visit.      Musculoskeletal: Strength & Muscle Tone:  UTA Gait & Station:  Seated Patient leans: N/A  Psychiatric Specialty Exam: Review of Systems  Psychiatric/Behavioral:  Positive for sleep disturbance. The patient is nervous/anxious.     There were no vitals taken for this visit.There is no height or weight on file to calculate BMI.  General Appearance: Casual  Eye Contact:  Fair  Speech:  Clear and Coherent  Volume:  Normal  Mood:  Anxious due to being sick  Affect:  Congruent  Thought  Process:  Goal Directed and Descriptions of Associations: Intact  Orientation:  Full (Time, Place, and Person)  Thought Content: Logical   Suicidal Thoughts:  No  Homicidal Thoughts:  No  Memory:  Immediate;   Fair Recent;   Fair Remote;   Fair  Judgement:  Fair  Insight:  Fair  Psychomotor Activity:  Normal  Concentration:  Concentration: Fair and Attention Span: Fair  Recall:  [employer] of Knowledge: Fair  Language: Fair  Akathisia:  No  Handed:  Right  AIMS (if indicated): not done  Assets:  Desire for Improvement Housing Social Support Transportation  ADL's:  Intact  Cognition: WNL  Sleep:  Poor due to viral infection   Screenings: Geneticist, molecular Office Visit from [facility]/19/2023 in [facility]  AIMS Total Score 0      GAD-7    Flowsheet Row Office Visit from 06/19/2023 in [facility] Counselor from 06/04/2023 in [facility] Counselor from 04/09/2023 in [facility] Office Visit from 01/30/2023 in [facility] Counselor from 01/09/2023 in [facility]  Total GAD-7 Score 14 16 18 19 19       PHQ2-9    Flowsheet Row Office Visit from 06/19/2023 in [facility] Counselor from 06/04/2023 in [facility] Counselor from 04/09/2023 in [facility] Video Visit from 03/19/2023 in [facility] Office Visit from 01/30/2023 in [facility]  PHQ-2 Total Score 6 6 5 5 6   PHQ-9 Total Score 15 21 21 18 24       Flowsheet Row Video Visit from 12/02/2023 in [facility] Video Visit from 08/01/2023 in [facility] Office Visit from 06/19/2023 in [facility]  C-SSRS RISK CATEGORY No Risk No Risk No Risk        Assessment and Plan: Aaron Nichols is a 53 year old Caucasian male, married, currently lives in [place] currently struggling with chronic pain, depression, anxiety as well as recent viral infection, discussed assessment and plan as noted below.  Depression in partial remission Depression and anxiety managed with Buspar  20 mg BID, Zoloft  100 mg daily, and Seroquel  25 mg QHS. Reports ongoing hopelessness and lack of motivation, exacerbated by recent illness. No current suicidal ideation or attempts. Discussed importance of part-time work or Curator to improve productivity. - Refer to therapist in-house, communicated with staff. - Follow up on medication refill issues with pharmacy - Continue BuSpar  20 mg twice daily - Continue Zoloft  100 mg daily-did not tolerate higher dosage - Continue Seroquel  25 mg at bedtime - Encourage exploration of part-time work or Curator  Anxiety-improving Anxiety managed with current medications. No significant concerns at this time other than recent viral infection. - Continue current medication regimen as noted above. - Continue Propranolol  10 mg twice a day as needed for anxiety. - Patient encouraged to establish care with therapist.  Insomnia-unstable Currently  sleep problems due to viral infection, cough.  Although he is getting better currently under the care of primary care provider. - Continue sleep hygiene techniques. - Continue Seroquel  25 mg at bedtime. - Continue pain management.   Collaboration of Care: Collaboration of Care: Referral or follow-up with counselor/therapist AEB encouraged to establish care with  therapist, I have communicated with staff to schedule this patient with new therapist.  Patient/Guardian was advised Release of Information must be obtained prior to any record release in order to collaborate their care with an outside provider. Patient/Guardian was advised if they have not already done so to contact the registration department to sign all necessary forms in order for us  to release information regarding their care.   Consent: Patient/Guardian gives verbal consent for treatment and assignment of benefits for services provided during this visit. Patient/Guardian expressed understanding and agreed to proceed.   This note was generated in part or whole with voice recognition software. Voice recognition is usually quite accurate but there are transcription errors that can and very often do occur. I apologize for any typographical errors that were not detected and corrected.    Keshav Winegar, MD 12/02/2023, 11:10 AM

## 2023-12-10 DIAGNOSIS — J441 Chronic obstructive pulmonary disease with (acute) exacerbation: Secondary | ICD-10-CM | POA: Diagnosis not present

## 2023-12-30 ENCOUNTER — Other Ambulatory Visit: Payer: Self-pay | Admitting: Psychiatry

## 2023-12-30 ENCOUNTER — Telehealth: Payer: Self-pay

## 2023-12-30 DIAGNOSIS — F3341 Major depressive disorder, recurrent, in partial remission: Secondary | ICD-10-CM

## 2023-12-30 DIAGNOSIS — F332 Major depressive disorder, recurrent severe without psychotic features: Secondary | ICD-10-CM

## 2023-12-30 MED ORDER — BUSPIRONE HCL 10 MG PO TABS: 20.0000 mg | ORAL_TABLET | Freq: Two times a day (BID) | ORAL | 0 refills | Status: DC

## 2023-12-30 NOTE — Telephone Encounter (Signed)
Pt notified that rx was sent to the pharmacy. 

## 2023-12-30 NOTE — Telephone Encounter (Signed)
 received fax requesting a refill on the buspar. pt last seen on 2-1--25 next appt 4--8-25

## 2023-12-30 NOTE — Telephone Encounter (Signed)
 A new Buspar 10 mg order, for 2 (20 mg total) twice a day, #120 for a 30 days supply e-scribed to patient's [facility] as approved by Dr. Gilmore Laroche this date.

## 2024-01-07 DIAGNOSIS — F489 Nonpsychotic mental disorder, unspecified: Secondary | ICD-10-CM | POA: Diagnosis not present

## 2024-01-07 DIAGNOSIS — J309 Allergic rhinitis, unspecified: Secondary | ICD-10-CM | POA: Diagnosis not present

## 2024-01-07 DIAGNOSIS — J449 Chronic obstructive pulmonary disease, unspecified: Secondary | ICD-10-CM | POA: Diagnosis not present

## 2024-01-07 DIAGNOSIS — E785 Hyperlipidemia, unspecified: Secondary | ICD-10-CM | POA: Diagnosis not present

## 2024-01-07 DIAGNOSIS — I10 Essential (primary) hypertension: Secondary | ICD-10-CM | POA: Diagnosis not present

## 2024-01-07 DIAGNOSIS — Z125 Encounter for screening for malignant neoplasm of prostate: Secondary | ICD-10-CM | POA: Diagnosis not present

## 2024-01-07 DIAGNOSIS — K219 Gastro-esophageal reflux disease without esophagitis: Secondary | ICD-10-CM | POA: Diagnosis not present

## 2024-01-07 DIAGNOSIS — M519 Unspecified thoracic, thoracolumbar and lumbosacral intervertebral disc disorder: Secondary | ICD-10-CM | POA: Diagnosis not present

## 2024-01-09 DIAGNOSIS — M5416 Radiculopathy, lumbar region: Secondary | ICD-10-CM | POA: Diagnosis not present

## 2024-01-09 DIAGNOSIS — Z79899 Other long term (current) drug therapy: Secondary | ICD-10-CM | POA: Diagnosis not present

## 2024-01-09 DIAGNOSIS — R269 Unspecified abnormalities of gait and mobility: Secondary | ICD-10-CM | POA: Diagnosis not present

## 2024-01-09 DIAGNOSIS — M961 Postlaminectomy syndrome, not elsewhere classified: Secondary | ICD-10-CM | POA: Diagnosis not present

## 2024-01-15 DIAGNOSIS — J441 Chronic obstructive pulmonary disease with (acute) exacerbation: Secondary | ICD-10-CM | POA: Diagnosis not present

## 2024-01-28 ENCOUNTER — Other Ambulatory Visit: Payer: Self-pay

## 2024-01-28 ENCOUNTER — Encounter: Payer: Self-pay | Admitting: Psychiatry

## 2024-01-28 ENCOUNTER — Ambulatory Visit (INDEPENDENT_AMBULATORY_CARE_PROVIDER_SITE_OTHER): Payer: Commercial Managed Care - HMO | Admitting: Psychiatry

## 2024-01-28 VITALS — BP 157/92 | HR 80 | Temp 97.5°F | Ht 70.0 in | Wt 248.4 lb

## 2024-01-28 DIAGNOSIS — F411 Generalized anxiety disorder: Secondary | ICD-10-CM | POA: Diagnosis not present

## 2024-01-28 DIAGNOSIS — F331 Major depressive disorder, recurrent, moderate: Secondary | ICD-10-CM

## 2024-01-28 DIAGNOSIS — F3341 Major depressive disorder, recurrent, in partial remission: Secondary | ICD-10-CM | POA: Insufficient documentation

## 2024-01-28 DIAGNOSIS — G4701 Insomnia due to medical condition: Secondary | ICD-10-CM | POA: Diagnosis not present

## 2024-01-28 MED ORDER — BUSPIRONE HCL 10 MG PO TABS: 20.0000 mg | ORAL_TABLET | Freq: Two times a day (BID) | ORAL | 1 refills | Status: DC

## 2024-01-28 MED ORDER — SERTRALINE HCL 100 MG PO TABS: 150.0000 mg | ORAL_TABLET | Freq: Every day | ORAL | 0 refills | Status: DC

## 2024-01-28 NOTE — Patient Instructions (Signed)
  [email or web address]  www.[email or web address]  [email or web address] [facility] [address], [place], [place] [address]   [phone]  [facility] [email or web address] [address], [place], [place] [address]  [phone]  [facility], [facility] [email or web address] [address], [place], [place] [address]  [phone]   Family solutions - [phone]  [facility] - [phone]  [facility] - [phone] counseling [phone]  [facility] [phone]   [email or web address] this clinician can offer telehealth and has a sliding scale option  [email or web address] this group also offers sliding scale rates and is based out of [place]  Dr. Liborio Nixon with the [facility] specializes in divorce  [employer] [facility] has interns who offer sliding scale rates and some of the full time clinicians do, as well. You complete their contact form on their website and the referrals coordinator will help to get connected to someone   [email or web address] [phone]  Medicaid below :  [facility] [address] [place], [place] [address]  [phone]    [employer] [address] [place], [place] [address]  [phone]    [facility] [address] [place], [place] [address]  [phone]

## 2024-01-28 NOTE — Progress Notes (Signed)
 [facility] MD OP Progress Note  01/28/2024 1:39 PM Aaron Nichols  MRN:  [id number]  Chief Complaint:  Chief Complaint  Patient presents with   Follow-up   Anxiety   Depression   Medication Refill    Discussed the use of AI scribe software for clinical note transcription with the patient, who gave verbal consent to proceed.  History of Present Illness Aaron Nichols is a 53 year old Caucasian male, currently on disability, married, lives in [place] has a history of major depression, generalized anxiety, insomnia, and COPD who presents with concerns about his mental health and COPD management.  He has a history of major depression, generalized anxiety, and insomnia. Recently, his insomnia has improved, but he continues to experience a lack of motivation and energy, which he attributes to an ongoing illness since Christmas. He is currently taking Buspar 20 mg twice a day and Zoloft 100 mg daily. While his anxiety has improved, he still struggles with motivation.  He has been diagnosed with COPD and experiences symptoms such as fever, achiness, and low energy. Treatment with doxycycline and prednisone provided temporary relief, but symptoms return after completing the antibiotic course. He completed a course of antibiotics five days ago and has not had a fever in the past week. He uses an inhaler, which has improved his breathing and sleep. Previous episodes of waking up feeling like he was choking have improved since using the inhaler.  He experiences sleep disturbances, including vivid dreams and dreams of being choked, which he associates with his breathing issues. Despite these disturbances, he has not had a sleep study done. He is not currently working with a therapist and has not received a call for a referral that was previously made.  He has gained significant weight, currently weighing 248 pounds, which is above his usual weight of 216-217 pounds. He attributes this weight gain to a lack of physical  activity and plans to start water therapy when the weather improves. He has a history of back issues and is on disability, which has been a source of stress. He has attempted physical therapy in the past without success and is considering joining a gym for exercise. He experiences fatigue and shortness of breath, particularly when walking to the mailbox. He suspects that his allergies, which he has had for seven years, may be contributing to his COPD symptoms. He has previously taken Xyzal for allergies but stopped during the winter.  He currently denies any suicidality, homicidality or perceptual disturbances.    Visit Diagnosis:    ICD-10-CM   1. MDD (major depressive disorder), recurrent episode, moderate (HCC)  F33.1     2. GAD (generalized anxiety disorder)  F41.1 sertraline (ZOLOFT) 100 MG tablet    3. Insomnia due to medical condition  G47.01 busPIRone (BUSPAR) 10 MG tablet   pain, mood      Past Psychiatric History: I have reviewed past psychiatric history from progress note on 10/31/2022.  Past trials of medications like duloxetine.  Past Medical History:  Past Medical History:  Diagnosis Date   Abnormal LFTs (liver function tests)    Arthritis    BP (high blood pressure) 01/26/2015   Brain tumor (HCC)    acoustic neuroma   Complication of anesthesia 10/22/2006   brain tumor 15 hours surgery resulting nausea and vomiting for severeal days   Complication of anesthesia    pt. woke up before extubation with cystoscopy- pt not aware of this   GERD (gastroesophageal reflux disease)  Hydronephrosis with urinary obstruction due to ureteral calculus 07/03/2015   Hypertension    Infection of the upper respiratory tract 04/22/2015   Kidney stone 10/22/1988   PONV (postoperative nausea and vomiting)    Right ureteral stone 07/03/2015   Right-sided Bell's palsy 10/22/2006   nerve fatigue    Past Surgical History:  Procedure Laterality Date   BRAIN TUMOR EXCISION  2008    CHOLECYSTECTOMY  2022   CYSTOSCOPY W/ RETROGRADES Left 09/07/2015   Procedure: CYSTOSCOPY WITH RETROGRADE PYELOGRAM;  Surgeon: Hildred Laser, MD;  Location: [facility];  Service: Urology;  Laterality: Left;   CYSTOSCOPY W/ RETROGRADES Left 10/03/2015   Procedure: CYSTOSCOPY WITH RETROGRADE PYELOGRAM;  Surgeon: Hildred Laser, MD;  Location: [facility];  Service: Urology;  Laterality: Left;   CYSTOSCOPY WITH STENT PLACEMENT Right 07/06/2015   Procedure: CYSTOSCOPY WITH STENT PLACEMENT;  Surgeon: Lorraine Lax, MD;  Location: [facility];  Service: Urology;  Laterality: Right;   CYSTOSCOPY WITH STENT PLACEMENT Left 09/07/2015   Procedure: CYSTOSCOPY WITH STENT PLACEMENT;  Surgeon: Hildred Laser, MD;  Location: [facility];  Service: Urology;  Laterality: Left;   CYSTOSCOPY WITH STENT PLACEMENT Left 10/03/2015   Procedure: CYSTOSCOPY WITH STENT PLACEMENT/ EXCHANGE/STONE BASKETING;  Surgeon: Hildred Laser, MD;  Location: [facility];  Service: Urology;  Laterality: Left;   ELBOW FRACTURE SURGERY Left 10/22/1996   INCISION AND DRAINAGE ABSCESS  2022   pt states this abscess was between his scrotum and anus   LUMBAR LAMINECTOMY/DECOMPRESSION MICRODISCECTOMY N/A 01/11/2022   Procedure: Microlumbar decompression Lumbar three-four;  Surgeon: Jene Every, MD;  Location: [facility];  Service: Orthopedics;  Laterality: N/A;   URETEROSCOPY WITH HOLMIUM LASER LITHOTRIPSY Right 07/06/2015   Procedure: URETEROSCOPY ;  Surgeon: Lorraine Lax, MD;  Location: [facility];  Service: Urology;  Laterality: Right;   URETEROSCOPY WITH HOLMIUM LASER LITHOTRIPSY Right 07/25/2015   Procedure: URETEROSCOPY WITH HOLMIUM LASER LITHOTRIPSY, CYSTOSCOPY,  STENT REMOVAL ;  Surgeon: Lorraine Lax, MD;  Location: [facility];  Service: Urology;  Laterality: Right;   URETEROSCOPY WITH HOLMIUM LASER LITHOTRIPSY Left 09/07/2015   Procedure: URETEROSCOPY;  Surgeon: Hildred Laser, MD;  Location: [facility];  Service: Urology;   Laterality: Left;   URETEROSCOPY WITH HOLMIUM LASER LITHOTRIPSY Left 10/03/2015   Procedure: URETEROSCOPY WITH HOLMIUM LASER LITHOTRIPSY;  Surgeon: Hildred Laser, MD;  Location: [facility];  Service: Urology;  Laterality: Left;    Family Psychiatric History: I have reviewed family psychiatric history from progress note on 10/31/2022.  Family History:  Family History  Problem Relation Age of Onset   Schizophrenia Mother    Alcohol abuse Father    Heart disease Father    Drug abuse Daughter     Social History: I have not social history from progress note on 10/31/2022. Social History   Socioeconomic History   Marital status: Married    Spouse name: Not on file   Number of children: 1   Years of education: Not on file   Highest education level: Some college, no degree  Occupational History   Not on file  Tobacco Use   Smoking status: Former    Current packs/day: 0.00    Types: Cigarettes    Quit date: 10/13/2021    Years since quitting: 2.2   Smokeless tobacco: Never  Vaping Use   Vaping status: Never Used  Substance and Sexual Activity   Alcohol use: No   Drug use: No   Sexual activity: Not Currently  Other Topics Concern   Not on file  Social History Narrative   Not on file   Social Drivers of Health   Financial Resource Strain: Medium Risk (12/09/2020)   [facility]   Overall Financial Resource Strain (CARDIA)    Difficulty of Paying Living Expenses: Somewhat hard  Food Insecurity: Food Insecurity Present (12/09/2020)   Received from [facility], [facility]   Hunger Vital Sign    Worried About Running Out of Food in the Last Year: Sometimes true    Ran Out of Food in the Last Year: Sometimes true  Transportation Needs: No Transportation Needs (12/09/2020)   [facility]   [facility] - Transportation    Lack of Transportation (Medical): No    Lack of Transportation (Non-Medical): No   Physical Activity: Not on file  Stress: Not on file  Social Connections: Not on file    Allergies:  Allergies  Allergen Reactions   Ceftin [Cefuroxime Axetil]     Dizziness,lightheaded   Prednisone Other (See Comments)    Metabolic Disorder Labs: No results found for: "HGBA1C", "MPG" No results found for: "PROLACTIN" No results found for: "CHOL", "TRIG", "HDL", "CHOLHDL", "VLDL", "LDLCALC" Lab Results  Component Value Date   TSH 1.925 11/26/2022    Therapeutic Level Labs: No results found for: "LITHIUM" No results found for: "VALPROATE" No results found for: "CBMZ"  Current Medications: Current Outpatient Medications  Medication Sig Dispense Refill   amLODipine (NORVASC) 5 MG tablet Take 5 mg by mouth daily.     atorvastatin (LIPITOR) 20 MG tablet Take 20 mg by mouth daily.     levocetirizine (XYZAL) 5 MG tablet Take 5 mg by mouth every evening.     lisinopril (ZESTRIL) 10 MG tablet Take 10 mg by mouth daily.     omeprazole (PRILOSEC) 20 MG capsule Take 20 mg by mouth daily.     oxyCODONE (OXY IR/ROXICODONE) 5 MG immediate release tablet Takes 5 mg twice daily     polyethylene glycol (MIRALAX / GLYCOLAX) 17 g packet Take 17 g by mouth daily. 14 each 0   propranolol (INDERAL) 10 MG tablet Take 1 tablet (10 mg total) by mouth 2 (two) times daily as needed. 60 tablet 2   QUEtiapine (SEROQUEL) 25 MG tablet TAKE 1 TABLET(25 MG) BY MOUTH AT BEDTIME 30 tablet 4   BREZTRI AEROSPHERE 160-9-4.8 MCG/ACT AERO inhaler Inhale into the lungs once.     busPIRone (BUSPAR) 10 MG tablet Take 2 tablets (20 mg total) by mouth 2 (two) times daily. 360 tablet 1   sertraline (ZOLOFT) 100 MG tablet Take 1.5 tablets (150 mg total) by mouth daily. 135 tablet 0   No current facility-administered medications for this visit.     Musculoskeletal: Strength & Muscle Tone: within normal limits Gait & Station:  walks with cane Patient leans: N/A  Psychiatric Specialty Exam: Review of Systems   Psychiatric/Behavioral:  Positive for decreased concentration, dysphoric mood and sleep disturbance. The patient is nervous/anxious.     Blood pressure (!) 157/92, pulse 80, temperature (!) 97.5 F (36.4 C), temperature source Temporal, height 5\' 10"  (1.778 m), weight 248 lb 6.4 oz (112.7 kg).Body mass index is 35.64 kg/m.  General Appearance: Fairly Groomed  Eye Contact:  Fair  Speech:  Clear and Coherent  Volume:  Normal  Mood:  Anxious and Depressed  Affect:  Congruent  Thought Process:  Goal Directed and Descriptions of Associations:  Intact  Orientation:  Full (Time, Place, and Person)  Thought Content: Logical   Suicidal Thoughts:  No  Homicidal Thoughts:  No  Memory:  Immediate;   Fair Recent;   Fair Remote;   Fair  Judgement:  Fair  Insight:  Fair  Psychomotor Activity:  Normal  Concentration:  Concentration: Fair and Attention Span: Fair  Recall:  [employer] of Knowledge: Fair  Language: Fair  Akathisia:  No  Handed:  Left  AIMS (if indicated): done  Assets:  Communication Skills Desire for Improvement Housing Social Support  ADL's:  Intact  Cognition: WNL  Sleep:   improving    Screenings: Geneticist, molecular Office Visit from 01/28/2024 in [facility] Office Visit from 06/19/2023 in [facility]  AIMS Total Score 0 0      GAD-7    Flowsheet Row Office Visit from 01/28/2024 in [facility] Office Visit from 06/19/2023 in [facility] Counselor from 06/04/2023 in [facility] Counselor from 04/09/2023 in [facility] Office Visit from 01/30/2023 in [facility]  Total GAD-7 Score 14 14 16 18 19       PHQ2-9    Flowsheet Row Office Visit from 01/28/2024 in [facility] Office Visit from 06/19/2023 in [facility] Counselor from 06/04/2023 in [facility] Counselor from 04/09/2023 in [facility] Video Visit from 03/19/2023 in [facility]  PHQ-2 Total Score 5 6 6 5 5   PHQ-9 Total Score 20 15 21 21 18       Flowsheet Row Office Visit from 01/28/2024 in [facility] Video Visit from 12/02/2023 in [facility] Video Visit from 08/01/2023 in [facility]  C-SSRS RISK CATEGORY No Risk No Risk No Risk        Assessment & Plan  Aaron Nichols is a 53 year old Caucasian male, with history of depression, anxiety, was evaluated in office today, discussed assessment and plan as noted below.  Major Depressive Disorder-unstable He reports improvement in anxiety symptoms but continues to experience low motivation, potentially linked to depression, illness, pain, or fatigue. Increasing sertraline dosage may improve motivation if related to depression. He is open to this adjustment and has not tried a higher dosage previously.   - Increase Sertraline to 150 mg daily   - Continue BuSpar 20 mg twice daily - Continue Seroquel 25 mg at bedtime - Encourage physical activity and exercise   - Advise on diet and weight management   - Encourage exposure to sunlight and engaging in hobbies   - Encouraged to establish care with therapist, provided resources.  Generalized Anxiety Disorder-improved He reports improvement in anxiety symptoms on the current regimen of Buspar and Zoloft. No changes to the anxiety medication are necessary at this time.   - Continue Buspar 20 mg twice daily   - Continue Propranolol 10 mg twice a day as needed  Insomnia-improving His sleep has improved with the  use of an inhaler for COPD, alleviating breathing issues affecting sleep.   - Continue sleep hygiene techniques - Consider referral for sleep study in the future  Follow-up - Follow-up in clinic in 5 to 6 weeks or sooner if needed.  Collaboration of Care: Collaboration of Care: Referral or follow-up with counselor/therapist AEB patient encouraged to establish care with therapist.  Patient/Guardian was advised Release of Information must be obtained prior to any record release in order to collaborate their care with an outside provider. Patient/Guardian was advised if they have not already done so to contact the registration department to sign all necessary forms in order for [place] to release information regarding their care.   Consent: Patient/Guardian gives verbal consent for treatment and assignment of benefits for services provided during this visit. Patient/Guardian expressed understanding and agreed to proceed.  This note was generated in part or whole with voice recognition software. Voice recognition is usually quite accurate but there are transcription errors that can and very often do occur. I apologize for any typographical errors that were not detected and corrected.    Jomarie Longs, MD 01/29/2024, 2:39 PM

## 2024-02-11 ENCOUNTER — Telehealth: Payer: Self-pay | Admitting: Psychiatry

## 2024-02-11 NOTE — Telephone Encounter (Signed)
 PT left his last follow up on 01/28/24 without scheduling a future appt. Message was left with him on 02/10/24 to call us  back to schedule a follow up.

## 2024-02-27 ENCOUNTER — Other Ambulatory Visit: Payer: Self-pay | Admitting: Psychiatry

## 2024-02-27 DIAGNOSIS — F411 Generalized anxiety disorder: Secondary | ICD-10-CM

## 2024-03-03 DIAGNOSIS — M542 Cervicalgia: Secondary | ICD-10-CM | POA: Diagnosis not present

## 2024-03-03 DIAGNOSIS — M545 Low back pain, unspecified: Secondary | ICD-10-CM | POA: Diagnosis not present

## 2024-03-05 DIAGNOSIS — J441 Chronic obstructive pulmonary disease with (acute) exacerbation: Secondary | ICD-10-CM | POA: Diagnosis not present

## 2024-03-05 DIAGNOSIS — G4733 Obstructive sleep apnea (adult) (pediatric): Secondary | ICD-10-CM | POA: Diagnosis not present

## 2024-03-05 DIAGNOSIS — Z125 Encounter for screening for malignant neoplasm of prostate: Secondary | ICD-10-CM | POA: Diagnosis not present

## 2024-03-05 DIAGNOSIS — E785 Hyperlipidemia, unspecified: Secondary | ICD-10-CM | POA: Diagnosis not present

## 2024-03-12 DIAGNOSIS — R739 Hyperglycemia, unspecified: Secondary | ICD-10-CM | POA: Diagnosis not present

## 2024-03-30 ENCOUNTER — Other Ambulatory Visit: Payer: Self-pay | Admitting: Psychiatry

## 2024-03-30 DIAGNOSIS — G4701 Insomnia due to medical condition: Secondary | ICD-10-CM

## 2024-03-30 DIAGNOSIS — M542 Cervicalgia: Secondary | ICD-10-CM | POA: Diagnosis not present

## 2024-03-30 DIAGNOSIS — M5416 Radiculopathy, lumbar region: Secondary | ICD-10-CM | POA: Diagnosis not present

## 2024-03-31 ENCOUNTER — Other Ambulatory Visit: Payer: Self-pay | Admitting: Psychiatry

## 2024-03-31 DIAGNOSIS — G4701 Insomnia due to medical condition: Secondary | ICD-10-CM

## 2024-04-02 ENCOUNTER — Other Ambulatory Visit: Payer: Self-pay | Admitting: Psychiatry

## 2024-04-02 ENCOUNTER — Telehealth: Payer: Self-pay

## 2024-04-02 DIAGNOSIS — G4701 Insomnia due to medical condition: Secondary | ICD-10-CM

## 2024-04-02 NOTE — Telephone Encounter (Signed)
 pt left a message that he needs his medications to go to [facility] in [place] and not [facility]. he did not say which medications (it looks as if pt should have enough to get to his next appt in 7-2). he also states that he has been under alot of stress about some health issues that he is having. (Will need to call pt back to get more details.)

## 2024-04-02 NOTE — Telephone Encounter (Signed)
Noted, please let me know

## 2024-04-02 NOTE — Telephone Encounter (Signed)
 called [facility] because the rx that was sent on 4-8 was for a 90 day supply and wanted to confirm pt only got 30 day supply. i spoke with a Hattie, she states that the last rx that pt received was from a Dr Classie Cruise and it was written on 3-10 for a 30 day supply and it was shipped out on 4-21.  The other rx from 01-28-24 was canceled so a new rx can be sent to the [facility] in [place]

## 2024-04-02 NOTE — Telephone Encounter (Signed)
 left message that it appears that he should have enough medications to get to his next appt. and that one of his medications should already be at the [facility] pharmacy. but i wanted him to call our office back to get more information and to also find out which medication he is ref.

## 2024-04-02 NOTE — Telephone Encounter (Signed)
 pt left message that he needed the buspar  10mg . he states that the last rx he got was on 4-21 and he only got 120 pills

## 2024-04-02 NOTE — Telephone Encounter (Signed)
 I have sent buspirone  to [facility] in [place].

## 2024-04-02 NOTE — Telephone Encounter (Signed)
 pt notified that rx sent to [facility]

## 2024-04-03 DIAGNOSIS — M25562 Pain in left knee: Secondary | ICD-10-CM | POA: Diagnosis not present

## 2024-04-16 DIAGNOSIS — G8929 Other chronic pain: Secondary | ICD-10-CM | POA: Diagnosis not present

## 2024-04-16 DIAGNOSIS — R269 Unspecified abnormalities of gait and mobility: Secondary | ICD-10-CM | POA: Diagnosis not present

## 2024-04-16 DIAGNOSIS — M961 Postlaminectomy syndrome, not elsewhere classified: Secondary | ICD-10-CM | POA: Diagnosis not present

## 2024-04-16 DIAGNOSIS — M25562 Pain in left knee: Secondary | ICD-10-CM | POA: Diagnosis not present

## 2024-04-16 DIAGNOSIS — Z79899 Other long term (current) drug therapy: Secondary | ICD-10-CM | POA: Diagnosis not present

## 2024-04-16 DIAGNOSIS — M545 Low back pain, unspecified: Secondary | ICD-10-CM | POA: Diagnosis not present

## 2024-04-16 DIAGNOSIS — M5416 Radiculopathy, lumbar region: Secondary | ICD-10-CM | POA: Diagnosis not present

## 2024-04-17 DIAGNOSIS — M4802 Spinal stenosis, cervical region: Secondary | ICD-10-CM | POA: Diagnosis not present

## 2024-04-17 DIAGNOSIS — M5144 Schmorl's nodes, thoracic region: Secondary | ICD-10-CM | POA: Diagnosis not present

## 2024-04-17 DIAGNOSIS — M5416 Radiculopathy, lumbar region: Secondary | ICD-10-CM | POA: Diagnosis not present

## 2024-04-17 DIAGNOSIS — M5134 Other intervertebral disc degeneration, thoracic region: Secondary | ICD-10-CM | POA: Diagnosis not present

## 2024-04-17 DIAGNOSIS — M4804 Spinal stenosis, thoracic region: Secondary | ICD-10-CM | POA: Diagnosis not present

## 2024-04-17 DIAGNOSIS — M542 Cervicalgia: Secondary | ICD-10-CM | POA: Diagnosis not present

## 2024-04-22 ENCOUNTER — Encounter: Payer: Self-pay | Admitting: Psychiatry

## 2024-04-22 ENCOUNTER — Ambulatory Visit (INDEPENDENT_AMBULATORY_CARE_PROVIDER_SITE_OTHER): Admitting: Psychiatry

## 2024-04-22 VITALS — BP 138/82 | HR 83 | Temp 98.7°F | Ht 70.0 in | Wt 246.6 lb

## 2024-04-22 DIAGNOSIS — F3341 Major depressive disorder, recurrent, in partial remission: Secondary | ICD-10-CM

## 2024-04-22 DIAGNOSIS — F411 Generalized anxiety disorder: Secondary | ICD-10-CM | POA: Diagnosis not present

## 2024-04-22 DIAGNOSIS — G4701 Insomnia due to medical condition: Secondary | ICD-10-CM | POA: Diagnosis not present

## 2024-04-22 MED ORDER — QUETIAPINE FUMARATE 25 MG PO TABS
12.5000 mg | ORAL_TABLET | Freq: Every day | ORAL | Status: DC
Start: 2024-04-22 — End: 2024-06-30

## 2024-04-22 MED ORDER — TRAZODONE HCL 50 MG PO TABS: 25.0000 mg | ORAL_TABLET | Freq: Every evening | ORAL | 1 refills | Status: DC | PRN

## 2024-04-22 NOTE — Patient Instructions (Signed)
 Trazodone Tablets What is this medication? TRAZODONE (TRAZ oh done) treats depression. It increases the amount of serotonin in the brain, a hormone that helps regulate mood. This medicine may be used for other purposes; ask your health care provider or pharmacist if you have questions. COMMON BRAND NAME(S): Desyrel What should I tell my care team before I take this medication? They need to know if you have any of these conditions: Bipolar disorder Bleeding disorder Glaucoma Heart disease, or previous heart attack Irregular heartbeat or rhythm Kidney disease Liver disease Low levels of sodium in the blood Suicidal thoughts, plans, or attempt by you or a family member An unusual or allergic reaction to trazodone, other medications, foods, dyes, or preservatives Pregnant or trying to get pregnant Breastfeeding How should I use this medication? Take this medication by mouth with a glass of water. Take it as directed on the prescription label at the same time every day. Take this medication shortly after a meal or a light snack. Keep taking this medication unless your care team tells you to stop. Stopping it too quickly can cause serious side effects. It can also make your condition worse. A special MedGuide will be given to you by the pharmacist with each prescription and refill. Be sure to read this information carefully each time. Talk to your care team about the use of this medication in children. Special care may be needed. Overdosage: If you think you have taken too much of this medicine contact a poison control center or emergency room at once. NOTE: This medicine is only for you. Do not share this medicine with others. What if I miss a dose? If you miss a dose, take it as soon as you can. If it is almost time for your next dose, take only that dose. Do not take double or extra doses. What may interact with this medication? Do not take this medication with any of the following: Certain  medications for fungal infections, such as fluconazole, itraconazole, ketoconazole, posaconazole, voriconazole Cisapride Dronedarone Linezolid MAOIs, such as Carbex, Eldepryl, Marplan, Nardil, and Parnate Mesoridazine Methylene blue (injected into a vein) Pimozide Saquinavir Thioridazine This medication may also interact with the following: Alcohol Antiviral medications for HIV or AIDS Aspirin and aspirin-like medications Barbiturates, such as phenobarbital Certain medications for blood pressure, heart disease, irregular heart beat Certain medications for mental health conditions Certain medications for migraine headache, such as almotriptan, eletriptan, frovatriptan, naratriptan, rizatriptan, sumatriptan, zolmitriptan Certain medications for seizures, such as carbamazepine and phenytoin Certain medications for sleep Certain medications that treat or prevent blood clots, such as dalteparin, enoxaparin, warfarin Digoxin Fentanyl Lithium NSAIDS, medications for pain and inflammation, such as ibuprofen or naproxen Other medications that cause heart rhythm changes Rasagiline Supplements, such as St. John's wort, kava kava, valerian Tramadol Tryptophan This list may not describe all possible interactions. Give your health care provider a list of all the medicines, herbs, non-prescription drugs, or dietary supplements you use. Also tell them if you smoke, drink alcohol, or use illegal drugs. Some items may interact with your medicine. What should I watch for while using this medication? Visit your care team for regular checks on your progress. Tell your care team if your symptoms do not start to get better or if they get worse. Because it may take several weeks to see the full effects of this medication, it is important to continue your treatment as prescribed by your care team. Watch for new or worsening thoughts of suicide or depression. This  includes sudden changes in mood, behaviors,  or thoughts. These changes can happen at any time but are more common in the beginning of treatment or after a change in dose. Call your care team right away if you experience these thoughts or worsening depression. This medication may cause mood and behavior changes, such as anxiety, nervousness, irritability, hostility, restlessness, excitability, hyperactivity, or trouble sleeping. These changes can happen at any time but are more common in the beginning of treatment or after a change in dose. Call your care team right away if you notice any of these symptoms. This medication may affect your coordination, reaction time, or judgment. Do not drive or operate machinery until you know how this medication affects you. Sit up or stand slowly to reduce the risk of dizzy or fainting spells. Drinking alcohol with this medication can increase the risk of these side effects. This medication may cause dry eyes and blurred vision. If you wear contact lenses you may feel some discomfort. Lubricating drops may help. See your care team if the problem does not go away or is severe. Your mouth may get dry. Chewing sugarless gum or sucking hard candy and drinking plenty of water may help. Contact your care team if the problem does not go away or is severe. What side effects may I notice from receiving this medication? Side effects that you should report to your care team as soon as possible: Allergic reactions--skin rash, itching, hives, swelling of the face, lips, tongue, or throat Bleeding--bloody or black, tar-like stools, red or dark brown urine, vomiting blood or brown material that looks like coffee grounds, small, red or purple spots on skin, unusual bleeding or bruising Heart rhythm changes--fast or irregular heartbeat, dizziness, feeling faint or lightheaded, chest pain, trouble breathing Low blood pressure--dizziness, feeling faint or lightheaded, blurry vision Low sodium level--muscle weakness, fatigue,  dizziness, headache, confusion Prolonged or painful erection Serotonin syndrome--irritability, confusion, fast or irregular heartbeat, muscle stiffness, twitching muscles, sweating, high fever, seizures, chills, vomiting, diarrhea Sudden eye pain or change in vision such as blurry vision, seeing halos around lights, vision loss Thoughts of suicide or self-harm, worsening mood, feelings of depression Side effects that usually do not require medical attention (report to your care team if they continue or are bothersome): Change in sex drive or performance Constipation Dizziness Drowsiness Dry mouth This list may not describe all possible side effects. Call your doctor for medical advice about side effects. You may report side effects to FDA at 1-800-FDA-1088. Where should I keep my medication? Keep out of the reach of children and pets. Store at room temperature between 15 and 30 degrees C (59 to 86 degrees F). Protect from light. Keep container tightly closed. Throw away any unused medication after the expiration date. NOTE: This sheet is a summary. It may not cover all possible information. If you have questions about this medicine, talk to your doctor, pharmacist, or health care provider.  2024 Elsevier/Gold Standard (2022-10-04 00:00:00)

## 2024-04-22 NOTE — Progress Notes (Signed)
 [facility] MD OP Progress Note  04/22/2024 2:33 PM Aaron Nichols  MRN:  [id number]  Chief Complaint:  Chief Complaint  Patient presents with   Follow-up   Anxiety   Depression   Medication Refill   Discussed the use of AI scribe software for clinical note transcription with the patient, who gave verbal consent to proceed.  History of Present Illness Aaron Nichols is a 53 year old Caucasian male, currently on disability, married, lives in [place], has a history of MDD, GAD, insomnia, COPD was evaluated in office today.  He has a new diagnosis of diabetes, which he attributes to prednisone use. He is not currently on medication for diabetes.  However that does worry him.He has a history of COPD and uses an inhaler, breathing issues does affect his sleep. He experiences waking up feeling like he is suffocating several times a night. He is scheduled for a sleep study in the first week of August.  He is experiencing knee and neck issues and is seeing a new provider who has discussed the possibility of surgery. A full body MRI was conducted last Friday, and he is awaiting a follow-up appointment to discuss the results.  He is currently on sertraline  150 mg for mood symptoms and Buspar  20 mg twice daily. Sertraline  has helped with his depression and anxiety. No suicidal thoughts or thoughts of harming others.  He has started aquatic therapy for exercise but spends a lot of time sitting around and in his room.   He is not currently seeing a psychotherapist and feels overwhelmed by the number of doctors he is seeing.  He denies any current suicidality, homicidality or perceptual disturbances.  He plans to switch his pharmacy back to [facility] due to delays with [facility].   Visit Diagnosis:    ICD-10-CM   1. Recurrent major depressive disorder, in partial remission (HCC)  F33.41 QUEtiapine  (SEROQUEL ) 25 MG tablet    2. GAD (generalized anxiety disorder)  F41.1     3. Insomnia due to medical  condition  G47.01 traZODone (DESYREL) 50 MG tablet   Pain, mood      Past Psychiatric History: I have reviewed past psychiatric history from progress note on 10/31/2022.  Past trials of medications like duloxetine.  Past Medical History:  Past Medical History:  Diagnosis Date   Abnormal LFTs (liver function tests)    Arthritis    BP (high blood pressure) 01/26/2015   Brain tumor (HCC)    acoustic neuroma   Complication of anesthesia 10/22/2006   brain tumor 15 hours surgery resulting nausea and vomiting for severeal days   Complication of anesthesia    pt. woke up before extubation with cystoscopy- pt not aware of this   GERD (gastroesophageal reflux disease)    Hydronephrosis with urinary obstruction due to ureteral calculus 07/03/2015   Hypertension    Infection of the upper respiratory tract 04/22/2015   Kidney stone 10/22/1988   PONV (postoperative nausea and vomiting)    Right ureteral stone 07/03/2015   Right-sided Bell's palsy 10/22/2006   nerve fatigue    Past Surgical History:  Procedure Laterality Date   BRAIN TUMOR EXCISION  2008   CHOLECYSTECTOMY  2022   CYSTOSCOPY W/ RETROGRADES Left 09/07/2015   Procedure: CYSTOSCOPY WITH RETROGRADE PYELOGRAM;  Surgeon: Redell Lynwood Napoleon, MD;  Location: [facility];  Service: Urology;  Laterality: Left;   CYSTOSCOPY W/ RETROGRADES Left 10/03/2015   Procedure: CYSTOSCOPY WITH RETROGRADE PYELOGRAM;  Surgeon: Redell Lynwood Napoleon, MD;  Location: [facility];  Service: Urology;  Laterality: Left;   CYSTOSCOPY WITH STENT PLACEMENT Right 07/06/2015   Procedure: CYSTOSCOPY WITH STENT PLACEMENT;  Surgeon: Charlie JONETTA Pack, MD;  Location: [facility];  Service: Urology;  Laterality: Right;   CYSTOSCOPY WITH STENT PLACEMENT Left 09/07/2015   Procedure: CYSTOSCOPY WITH STENT PLACEMENT;  Surgeon: Redell Lynwood Napoleon, MD;  Location: [facility];  Service: Urology;  Laterality: Left;   CYSTOSCOPY WITH STENT PLACEMENT Left 10/03/2015   Procedure: CYSTOSCOPY  WITH STENT PLACEMENT/ EXCHANGE/STONE BASKETING;  Surgeon: Redell Lynwood Napoleon, MD;  Location: [facility];  Service: Urology;  Laterality: Left;   ELBOW FRACTURE SURGERY Left 10/22/1996   INCISION AND DRAINAGE ABSCESS  2022   pt states this abscess was between his scrotum and anus   LUMBAR LAMINECTOMY/DECOMPRESSION MICRODISCECTOMY N/A 01/11/2022   Procedure: Microlumbar decompression Lumbar three-four;  Surgeon: Duwayne Purchase, MD;  Location: [facility];  Service: Orthopedics;  Laterality: N/A;   URETEROSCOPY WITH HOLMIUM LASER LITHOTRIPSY Right 07/06/2015   Procedure: URETEROSCOPY ;  Surgeon: Charlie JONETTA Pack, MD;  Location: [facility];  Service: Urology;  Laterality: Right;   URETEROSCOPY WITH HOLMIUM LASER LITHOTRIPSY Right 07/25/2015   Procedure: URETEROSCOPY WITH HOLMIUM LASER LITHOTRIPSY, CYSTOSCOPY,  STENT REMOVAL ;  Surgeon: Charlie JONETTA Pack, MD;  Location: [facility];  Service: Urology;  Laterality: Right;   URETEROSCOPY WITH HOLMIUM LASER LITHOTRIPSY Left 09/07/2015   Procedure: URETEROSCOPY;  Surgeon: Redell Lynwood Napoleon, MD;  Location: [facility];  Service: Urology;  Laterality: Left;   URETEROSCOPY WITH HOLMIUM LASER LITHOTRIPSY Left 10/03/2015   Procedure: URETEROSCOPY WITH HOLMIUM LASER LITHOTRIPSY;  Surgeon: Redell Lynwood Napoleon, MD;  Location: [facility];  Service: Urology;  Laterality: Left;    Family Psychiatric History: I have reviewed family psychiatric history from progress note on 10/31/2022.  Family History:  Family History  Problem Relation Age of Onset   Schizophrenia Mother    Alcohol abuse Father    Heart disease Father    Drug abuse Daughter     Social History: I have reviewed social history from progress note on 10/31/2022. Social History   Socioeconomic History   Marital status: Married    Spouse name: Not on file   Number of children: 1   Years of education: Not on file   Highest education level: Some college, no degree  Occupational History   Not on file  Tobacco Use    Smoking status: Former    Current packs/day: 0.00    Types: Cigarettes    Quit date: 10/13/2021    Years since quitting: 2.5   Smokeless tobacco: Never  Vaping Use   Vaping status: Never Used  Substance and Sexual Activity   Alcohol use: No   Drug use: No   Sexual activity: Not Currently  Other Topics Concern   Not on file  Social History Narrative   Not on file   Social Drivers of Health   Financial Resource Strain: Medium Risk (12/09/2020)   Received from [facility]   Overall Financial Resource Strain (CARDIA)    Difficulty of Paying Living Expenses: Somewhat hard  Food Insecurity: Food Insecurity Present (12/09/2020)   Received from [facility]   Hunger Vital Sign    Within the past 12 months, you worried that your food would run out before you got the money to buy more.: Sometimes true    Within the past 12 months, the food you bought just didn't last and you didn't have money to get more.:  Sometimes true  Transportation Needs: No Transportation Needs (12/09/2020)   Received from [facility] - Transportation    Lack of Transportation (Medical): No    Lack of Transportation (Non-Medical): No  Physical Activity: Not on file  Stress: Not on file  Social Connections: Not on file    Allergies:  Allergies  Allergen Reactions   Ceftin  [Cefuroxime  Axetil]     Dizziness,lightheaded   Prednisone Other (See Comments)    Metabolic Disorder Labs: No results found for: HGBA1C, MPG No results found for: PROLACTIN No results found for: CHOL, TRIG, HDL, CHOLHDL, VLDL, LDLCALC Lab Results  Component Value Date   TSH 1.925 11/26/2022    Therapeutic Level Labs: No results found for: LITHIUM No results found for: VALPROATE No results found for: CBMZ  Current Medications: Current Outpatient Medications  Medication Sig Dispense Refill   amLODipine  (NORVASC ) 5 MG tablet Take 5 mg by mouth daily.     atorvastatin (LIPITOR) 20 MG  tablet Take 20 mg by mouth daily.     BREZTRI AEROSPHERE 160-9-4.8 MCG/ACT AERO inhaler Inhale into the lungs once.     busPIRone  (BUSPAR ) 10 MG tablet TAKE 2 TABLETS(20 MG) BY MOUTH TWICE DAILY 360 tablet 0   levocetirizine (XYZAL) 5 MG tablet Take 5 mg by mouth every evening.     lisinopril  (ZESTRIL ) 10 MG tablet Take 10 mg by mouth daily.     omeprazole (PRILOSEC) 20 MG capsule Take 20 mg by mouth daily.     oxyCODONE  (OXY IR/ROXICODONE ) 5 MG immediate release tablet Takes 5 mg twice daily     polyethylene glycol (MIRALAX  / GLYCOLAX ) 17 g packet Take 17 g by mouth daily. 14 each 0   propranolol  (INDERAL ) 10 MG tablet Take 1 tablet (10 mg total) by mouth 2 (two) times daily as needed. 60 tablet 2   sertraline  (ZOLOFT ) 100 MG tablet Take 1.5 tablets (150 mg total) by mouth daily. 135 tablet 0   traZODone (DESYREL) 50 MG tablet Take 0.5-1 tablets (25-50 mg total) by mouth at bedtime as needed for sleep. 30 tablet 1   QUEtiapine  (SEROQUEL ) 25 MG tablet Take 0.5 tablets (12.5 mg total) by mouth at bedtime for 7 days.     No current facility-administered medications for this visit.     Musculoskeletal: Strength & Muscle Tone: within normal limits Gait & Station: walks with a cane Patient leans: N/A  Psychiatric Specialty Exam: Review of Systems  Psychiatric/Behavioral:  Positive for dysphoric mood and sleep disturbance. The patient is nervous/anxious.     Blood pressure 138/82, pulse 83, temperature 98.7 F (37.1 C), temperature source Temporal, height 5' 10 (1.778 m), weight 246 lb 9.6 oz (111.9 kg), SpO2 100%.Body mass index is 35.38 kg/m.  General Appearance: Casual  Eye Contact:  Fair  Speech:  Clear and Coherent  Volume:  Normal  Mood:  Anxious and Depressed  Affect:  Congruent  Thought Process:  Goal Directed and Descriptions of Associations: Intact  Orientation:  Full (Time, Place, and Person)  Thought Content: Logical   Suicidal Thoughts:  No  Homicidal Thoughts:  No   Memory:  Immediate;   Fair Recent;   Fair Remote;   Fair  Judgement:  Fair  Insight:  Fair  Psychomotor Activity:  Restless due to pain  Concentration:  Concentration: Fair and Attention Span: Fair  Recall:  [employer] of Knowledge: Fair  Language: Fair  Akathisia:  No  Handed:  Left  AIMS (if indicated):  done  Assets:  Communication Skills Desire for Improvement Housing Social Support Transportation  ADL's:  Intact  Cognition: WNL  Sleep:  Affected due to pain, possible sleep apnea   Screenings: Geneticist, molecular Office Visit from 04/22/2024 in [facility] Office Visit from 01/28/2024 in [facility] Office Visit from 06/19/2023 in [facility]  AIMS Total Score 0 0 0   GAD-7    Flowsheet Row Office Visit from 04/22/2024 in [facility] Office Visit from 01/28/2024 in [facility] Office Visit from 06/19/2023 in [facility] Counselor from 06/04/2023 in [facility] Counselor from 04/09/2023 in [facility]  Total GAD-7 Score 16 14 14 16 18    PHQ2-9    Flowsheet Row Office Visit from 04/22/2024 in [facility] Office Visit from 01/28/2024 in [facility] Office Visit from 06/19/2023 in [facility] Counselor from 06/04/2023 in [facility] Counselor from 04/09/2023 in [facility]  PHQ-2 Total Score 6 5 6 6 5   PHQ-9 Total Score 23 20 15 21 21    Flowsheet Row Office Visit from 04/22/2024 in [facility] Office Visit from 01/28/2024 in [facility] Video Visit from 12/02/2023 in [facility]  C-SSRS RISK CATEGORY No Risk No Risk No Risk     Assessment and Plan: Aaron Nichols is a 53 year old Caucasian male with history of depression, anxiety was evaluated in office today.  Discussed assessment and plan as noted below.  Major depression-unstable Currently mood symptoms affected by comorbid pain, recent diagnosis of diabetes mellitus.  The higher dosage of sertraline  seems to be beneficial.  Agreeable to establishing care with the therapist.  Agreeable to being tapered off of Seroquel  due to recent elevated hemoglobin A1c/diabetes diagnosis. Continue Sertraline  150 mg daily Continue BuSpar  20 mg twice daily Reduce Seroquel  to 12.5 mg at bedtime for 7 days and then stop taking it. Add Trazodone 25 to 50 mg at bedtime as needed for sleep  Generalized anxiety disorder-unstable Anxiety symptoms exacerbated by recent situational stressors.  Agrees to establish care with the therapist. Continue BuSpar  20 mg twice daily Continue Propranolol  10 mg twice a day as needed Continue Sertraline  as prescribed Has resources available to establish care with the therapist, provided last visit.  Insomnia-unstable Sleep problems multifactorial including COPD, pain Pending sleep study, referred by primary care provider. Continue sleep hygiene techniques. Will need sufficient pain management. Start Trazodone 25-50 mg at bedtime as needed.  Follow-up Follow-up in clinic in 2 to 3 weeks or sooner if needed.  Collaboration of Care: Collaboration of Care: Referral or follow-up with counselor/therapist AEB encouraged to establish care with therapist.  Patient/Guardian was advised Release of Information must be obtained prior to any record release in order to collaborate their care with an outside provider. Patient/Guardian was advised if they have not already done so to  contact the registration department to sign all necessary forms in order for us  to release information regarding their care.   Consent: Patient/Guardian gives verbal consent for treatment and assignment of benefits for services provided during this visit. Patient/Guardian expressed understanding and agreed to proceed.  This note was generated in part  or whole with voice recognition software. Voice recognition is usually quite accurate but there are transcription errors that can and very often do occur. I apologize for any typographical errors that were not detected and corrected.     Kalley Nicholl, MD 04/23/2024, 1:21 PM

## 2024-04-30 DIAGNOSIS — M5416 Radiculopathy, lumbar region: Secondary | ICD-10-CM | POA: Diagnosis not present

## 2024-04-30 DIAGNOSIS — G5603 Carpal tunnel syndrome, bilateral upper limbs: Secondary | ICD-10-CM | POA: Diagnosis not present

## 2024-05-04 DIAGNOSIS — M542 Cervicalgia: Secondary | ICD-10-CM | POA: Diagnosis not present

## 2024-05-04 DIAGNOSIS — M5416 Radiculopathy, lumbar region: Secondary | ICD-10-CM | POA: Diagnosis not present

## 2024-05-14 DIAGNOSIS — G8929 Other chronic pain: Secondary | ICD-10-CM | POA: Diagnosis not present

## 2024-05-14 DIAGNOSIS — M25562 Pain in left knee: Secondary | ICD-10-CM | POA: Diagnosis not present

## 2024-05-14 DIAGNOSIS — M961 Postlaminectomy syndrome, not elsewhere classified: Secondary | ICD-10-CM | POA: Diagnosis not present

## 2024-05-14 DIAGNOSIS — M5416 Radiculopathy, lumbar region: Secondary | ICD-10-CM | POA: Diagnosis not present

## 2024-05-14 DIAGNOSIS — R269 Unspecified abnormalities of gait and mobility: Secondary | ICD-10-CM | POA: Diagnosis not present

## 2024-05-14 DIAGNOSIS — Z79899 Other long term (current) drug therapy: Secondary | ICD-10-CM | POA: Diagnosis not present

## 2024-05-25 DIAGNOSIS — M25562 Pain in left knee: Secondary | ICD-10-CM | POA: Diagnosis not present

## 2024-06-30 ENCOUNTER — Encounter: Payer: Self-pay | Admitting: Psychiatry

## 2024-06-30 ENCOUNTER — Telehealth (INDEPENDENT_AMBULATORY_CARE_PROVIDER_SITE_OTHER): Admitting: Psychiatry

## 2024-06-30 DIAGNOSIS — G4700 Insomnia, unspecified: Secondary | ICD-10-CM | POA: Diagnosis not present

## 2024-06-30 DIAGNOSIS — F411 Generalized anxiety disorder: Secondary | ICD-10-CM

## 2024-06-30 DIAGNOSIS — F3341 Major depressive disorder, recurrent, in partial remission: Secondary | ICD-10-CM

## 2024-06-30 DIAGNOSIS — G4701 Insomnia due to medical condition: Secondary | ICD-10-CM

## 2024-06-30 MED ORDER — BUSPIRONE HCL 10 MG PO TABS: 20.0000 mg | ORAL_TABLET | Freq: Two times a day (BID) | ORAL | 1 refills | Status: AC

## 2024-06-30 MED ORDER — SERTRALINE HCL 100 MG PO TABS
150.0000 mg | ORAL_TABLET | Freq: Every day | ORAL | 1 refills | Status: AC
Start: 2024-06-30 — End: ?

## 2024-06-30 MED ORDER — TRAZODONE HCL 100 MG PO TABS: 100.0000 mg | ORAL_TABLET | Freq: Every day | ORAL | 1 refills | Status: AC

## 2024-06-30 NOTE — Progress Notes (Signed)
 Virtual Visit via Video Note  I connected with Aaron Nichols on 06/30/24 at 11:20 AM EDT by a video enabled telemedicine application and verified that I am speaking with the correct person using two identifiers.  Location Provider Location : [facility] Patient Location : Home  Participants: Patient , Provider   I discussed the limitations of evaluation and management by telemedicine and the availability of in person appointments. The patient expressed understanding and agreed to proceed.    I discussed the assessment and treatment plan with the patient. The patient was provided an opportunity to ask questions and all were answered. The patient agreed with the plan and demonstrated an understanding of the instructions.   The patient was advised to call back or seek an in-person evaluation if the symptoms worsen or if the condition fails to improve as anticipated.  [facility] MD OP Progress Note  06/30/2024 11:35 AM Aaron Nichols  MRN:  [id number]  Chief Complaint:  Chief Complaint  Patient presents with   Follow-up   Depression   Anxiety   Medication Refill   Discussed the use of AI scribe software for clinical note transcription with the patient, who gave verbal consent to proceed.  History of Present Illness Aaron Nichols is a 53 year old Caucasian male, currently on disability, married, lives in [place], has a history of MDD, GAD, insomnia, COPD was evaluated by telemedicine today.    Variable mood with both good and bad days continues to affect him. He describes ongoing symptoms of depression and notes that his mood improved while taking sertraline  150 mg daily. After reducing the dose to 100 mg daily for the past 2 weeks due to a pharmacy refill issue, he noticed a difference in his mood and feels that the higher dose helps with depression and anxiety. Currently, he takes sertraline  100 mg daily but expresses a preference to return to 150 mg daily. He also takes buspirone  10 mg, 2 tablets twice  daily.  He denies side effects.  Persistent sleep difficulties remain a concern, as he states he does not sleep well and often wakes up every 45 minutes to 1 hour during the night. Pain primarily interrupts his sleep. He now takes trazodone  50 mg at bedtime for sleep but has not noticed significant improvement. Pain continues to disrupt his sleep even with medication changes.  He denies any thoughts of hurting himself or others when the provider asks him directly.  Staying occupied with home projects, such as repairing a bathroom, helps him manage his time. He reports that he is able to complete these activities by working in short intervals and taking breaks due to pain. Previously, he engaged in aquatic therapy at home but stopped because cooler weather and financial barriers prevent him from accessing community facilities. Ongoing pain remains a significant stressor impacting his daily functioning and sleep.  Denies any other concerns today.  Visit Diagnosis:    ICD-10-CM   1. Recurrent major depressive disorder, in partial remission (HCC)  F33.41     2. GAD (generalized anxiety disorder)  F41.1 sertraline  (ZOLOFT ) 100 MG tablet    3. Insomnia due to medical condition  G47.01 traZODone  (DESYREL ) 100 MG tablet    busPIRone  (BUSPAR ) 10 MG tablet   pain, mood      Past Psychiatric History: I have reviewed past psychiatric history from progress note on 10/31/2022.  Past trials of medications like duloxetine.  Past Medical History:  Past Medical History:  Diagnosis Date   Abnormal LFTs (  liver function tests)    Arthritis    BP (high blood pressure) 01/26/2015   Brain tumor (HCC)    acoustic neuroma   Complication of anesthesia 10/22/2006   brain tumor 15 hours surgery resulting nausea and vomiting for severeal days   Complication of anesthesia    pt. woke up before extubation with cystoscopy- pt not aware of this   GERD (gastroesophageal reflux disease)    Hydronephrosis with  urinary obstruction due to ureteral calculus 07/03/2015   Hypertension    Infection of the upper respiratory tract 04/22/2015   Kidney stone 10/22/1988   PONV (postoperative nausea and vomiting)    Right ureteral stone 07/03/2015   Right-sided Bell's palsy 10/22/2006   nerve fatigue    Past Surgical History:  Procedure Laterality Date   BRAIN TUMOR EXCISION  2008   CHOLECYSTECTOMY  2022   CYSTOSCOPY W/ RETROGRADES Left 09/07/2015   Procedure: CYSTOSCOPY WITH RETROGRADE PYELOGRAM;  Surgeon: Redell Lynwood Napoleon, MD;  Location: [facility];  Service: Urology;  Laterality: Left;   CYSTOSCOPY W/ RETROGRADES Left 10/03/2015   Procedure: CYSTOSCOPY WITH RETROGRADE PYELOGRAM;  Surgeon: Redell Lynwood Napoleon, MD;  Location: [facility];  Service: Urology;  Laterality: Left;   CYSTOSCOPY WITH STENT PLACEMENT Right 07/06/2015   Procedure: CYSTOSCOPY WITH STENT PLACEMENT;  Surgeon: Charlie JONETTA Pack, MD;  Location: [facility];  Service: Urology;  Laterality: Right;   CYSTOSCOPY WITH STENT PLACEMENT Left 09/07/2015   Procedure: CYSTOSCOPY WITH STENT PLACEMENT;  Surgeon: Redell Lynwood Napoleon, MD;  Location: [facility];  Service: Urology;  Laterality: Left;   CYSTOSCOPY WITH STENT PLACEMENT Left 10/03/2015   Procedure: CYSTOSCOPY WITH STENT PLACEMENT/ EXCHANGE/STONE BASKETING;  Surgeon: Redell Lynwood Napoleon, MD;  Location: [facility];  Service: Urology;  Laterality: Left;   ELBOW FRACTURE SURGERY Left 10/22/1996   INCISION AND DRAINAGE ABSCESS  2022   pt states this abscess was between his scrotum and anus   LUMBAR LAMINECTOMY/DECOMPRESSION MICRODISCECTOMY N/A 01/11/2022   Procedure: Microlumbar decompression Lumbar three-four;  Surgeon: Duwayne Purchase, MD;  Location: [facility];  Service: Orthopedics;  Laterality: N/A;   URETEROSCOPY WITH HOLMIUM LASER LITHOTRIPSY Right 07/06/2015   Procedure: URETEROSCOPY ;  Surgeon: Charlie JONETTA Pack, MD;  Location: [facility];  Service: Urology;  Laterality: Right;   URETEROSCOPY WITH HOLMIUM  LASER LITHOTRIPSY Right 07/25/2015   Procedure: URETEROSCOPY WITH HOLMIUM LASER LITHOTRIPSY, CYSTOSCOPY,  STENT REMOVAL ;  Surgeon: Charlie JONETTA Pack, MD;  Location: [facility];  Service: Urology;  Laterality: Right;   URETEROSCOPY WITH HOLMIUM LASER LITHOTRIPSY Left 09/07/2015   Procedure: URETEROSCOPY;  Surgeon: Redell Lynwood Napoleon, MD;  Location: [facility];  Service: Urology;  Laterality: Left;   URETEROSCOPY WITH HOLMIUM LASER LITHOTRIPSY Left 10/03/2015   Procedure: URETEROSCOPY WITH HOLMIUM LASER LITHOTRIPSY;  Surgeon: Redell Lynwood Napoleon, MD;  Location: [facility];  Service: Urology;  Laterality: Left;    Family Psychiatric History: I have reviewed family psychiatric history from progress note on 10/31/2022.  Family History:  Family History  Problem Relation Age of Onset   Schizophrenia Mother    Alcohol abuse Father    Heart disease Father    Drug abuse Daughter     Social History: I have reviewed social history from progress note on 10/31/2022. Social History   Socioeconomic History   Marital status: Married    Spouse name: Not on file   Number of children: 1   Years of education: Not on file   Highest education level: Some college, no degree  Occupational History   Not on file  Tobacco Use   Smoking status: Former    Current packs/day: 0.00    Types: Cigarettes    Quit date: 10/13/2021    Years since quitting: 2.7   Smokeless tobacco: Never  Vaping Use   Vaping status: Never Used  Substance and Sexual Activity   Alcohol use: No   Drug use: No   Sexual activity: Not Currently  Other Topics Concern   Not on file  Social History Narrative   Not on file   Social Drivers of Health   Financial Resource Strain: Medium Risk (12/09/2020)   Received from [facility]   Overall Financial Resource Strain (CARDIA)    Difficulty of Paying Living Expenses: Somewhat hard  Food Insecurity: Food Insecurity Present (12/09/2020)   Received from [facility]   Hunger Vital Sign     Within the past 12 months, you worried that your food would run out before you got the money to buy more.: Sometimes true    Within the past 12 months, the food you bought just didn't last and you didn't have money to get more.: Sometimes true  Transportation Needs: No Transportation Needs (12/09/2020)   Received from [facility]   PRAPARE - Transportation    Lack of Transportation (Medical): No    Lack of Transportation (Non-Medical): No  Physical Activity: Not on file  Stress: Not on file  Social Connections: Not on file    Allergies:  Allergies  Allergen Reactions   Ceftin  [Cefuroxime  Axetil]     Dizziness,lightheaded   Prednisone Other (See Comments)    Metabolic Disorder Labs: No results found for: HGBA1C, MPG No results found for: PROLACTIN No results found for: CHOL, TRIG, HDL, CHOLHDL, VLDL, LDLCALC Lab Results  Component Value Date   TSH 1.925 11/26/2022    Therapeutic Level Labs: No results found for: LITHIUM No results found for: VALPROATE No results found for: CBMZ  Current Medications: Current Outpatient Medications  Medication Sig Dispense Refill   oxyCODONE  (OXY IR/ROXICODONE ) 5 MG immediate release tablet Takes 5 mg twice daily (Patient taking differently: Take 5 mg by mouth. Takes 5 mg 4 times daily)     traZODone  (DESYREL ) 100 MG tablet Take 1 tablet (100 mg total) by mouth at bedtime. 90 tablet 1   amLODipine  (NORVASC ) 5 MG tablet Take 5 mg by mouth daily.     atorvastatin (LIPITOR) 20 MG tablet Take 20 mg by mouth daily.     BREZTRI AEROSPHERE 160-9-4.8 MCG/ACT AERO inhaler Inhale into the lungs once.     busPIRone  (BUSPAR ) 10 MG tablet Take 2 tablets (20 mg total) by mouth 2 (two) times daily. 360 tablet 1   levocetirizine (XYZAL) 5 MG tablet Take 5 mg by mouth every evening.     lisinopril  (ZESTRIL ) 10 MG tablet Take 10 mg by mouth daily.     omeprazole (PRILOSEC) 20 MG capsule Take 20 mg by mouth daily.      polyethylene glycol (MIRALAX  / GLYCOLAX ) 17 g packet Take 17 g by mouth daily. 14 each 0   propranolol  (INDERAL ) 10 MG tablet Take 1 tablet (10 mg total) by mouth 2 (two) times daily as needed. 60 tablet 2   sertraline  (ZOLOFT ) 100 MG tablet Take 1.5 tablets (150 mg total) by mouth daily. 135 tablet 1   No current facility-administered medications for this visit.     Musculoskeletal: Strength & Muscle Tone: UTA Gait & Station: Seated Patient leans:  N/A  Psychiatric Specialty Exam: Review of Systems  Psychiatric/Behavioral:  Positive for dysphoric mood and sleep disturbance. The patient is nervous/anxious.     There were no vitals taken for this visit.There is no height or weight on file to calculate BMI.  General Appearance: Casual  Eye Contact:  Fair  Speech:  Normal Rate  Volume:  Normal  Mood:  Anxious and Depressed  Affect:  Congruent  Thought Process:  Goal Directed and Descriptions of Associations: Intact  Orientation:  Full (Time, Place, and Person)  Thought Content: Logical   Suicidal Thoughts:  No  Homicidal Thoughts:  No  Memory:  Immediate;   Fair Recent;   Fair Remote;   Fair  Judgement:  Fair  Insight:  Fair  Psychomotor Activity:  Normal  Concentration:  Concentration: Fair and Attention Span: Fair  Recall:  [employer] of Knowledge: Fair  Language: Fair  Akathisia:  No  Handed:  Left  AIMS (if indicated): not done  Assets:  Communication Skills Desire for Improvement Housing Social Support  ADL's:  Intact  Cognition: WNL  Sleep:  poor   Screenings: Geneticist, molecular Office Visit from 04/22/2024 in [facility] Office Visit from 01/28/2024 in [facility] Office Visit from 06/19/2023 in [facility]  AIMS Total Score 0 0 0   GAD-7    Flowsheet Row Office Visit from 04/22/2024 in [facility]  Office Visit from 01/28/2024 in [facility] Office Visit from 06/19/2023 in [facility] Counselor from 06/04/2023 in [facility] Counselor from 04/09/2023 in [facility]  Total GAD-7 Score 16 14 14 16 18    PHQ2-9    Flowsheet Row Office Visit from 04/22/2024 in [facility] Office Visit from 01/28/2024 in [facility] Office Visit from 06/19/2023 in [facility] Counselor from 06/04/2023 in [facility] Counselor from 04/09/2023 in [facility]  PHQ-2 Total Score 6 5 6 6 5   PHQ-9 Total Score 23 20 15 21 21    Flowsheet Row Office Visit from 04/22/2024 in [facility] Office Visit from 01/28/2024 in [facility] Video Visit from 12/02/2023 in [facility]  C-SSRS RISK CATEGORY No Risk No Risk No Risk     Assessment and Plan: Aaron Nichols is a 53 year old Caucasian male with history of depression, anxiety was evaluated by telemedicine today.  Discussed assessment and plan as noted below.  Major depression-in partial remission Currently on a lower dosage of sertraline  and reports it has affected his mood symptoms since lowering the dosage.  Would like to go back on the sertraline  150 mg daily.  Seroquel  was tapered off due to concerns about elevated hemoglobin A1c. Restart Sertraline  150 mg daily Continue BuSpar  20 mg twice daily  Generalized anxiety disorder-unstable Has noticed some recent worsening due to being on the lower dosage of sertraline .  The higher dosages were beneficial. Restart Sertraline  150 mg daily Continue BuSpar  20 mg  twice daily Discussed referral for psychotherapy, declines.  Insomnia-unstable Sleep problems likely due to pain Pending sleep study. Will need sufficient pain management. Increase Trazodone  to 100 mg at bedtime as needed  Follow-up Follow-up in clinic  in 2 months or sooner if needed.  Collaboration of Care: Collaboration of Care: Patient refused AEB patient declines referral for psychotherapy  Patient/Guardian was advised Release of Information must be obtained prior to any record release in order to collaborate their care with an outside provider. Patient/Guardian was advised if they have not already done so to contact the registration department to sign all necessary forms in order for us  to release information regarding their care.   Consent: Patient/Guardian gives verbal consent for treatment and assignment of benefits for services provided during this visit. Patient/Guardian expressed understanding and agreed to proceed.   This note was generated in part or whole with voice recognition software. Voice recognition is usually quite accurate but there are transcription errors that can and very often do occur. I apologize for any typographical errors that were not detected and corrected.    Mafalda Mcginniss, MD 06/30/2024, 11:35 AM

## 2024-07-06 DIAGNOSIS — R7309 Other abnormal glucose: Secondary | ICD-10-CM | POA: Diagnosis not present

## 2024-07-08 ENCOUNTER — Other Ambulatory Visit: Payer: Self-pay | Admitting: Medical Genetics

## 2024-07-12 DIAGNOSIS — S60222A Contusion of left hand, initial encounter: Secondary | ICD-10-CM | POA: Diagnosis not present

## 2024-07-13 DIAGNOSIS — I10 Essential (primary) hypertension: Secondary | ICD-10-CM | POA: Diagnosis not present

## 2024-07-13 DIAGNOSIS — Z1331 Encounter for screening for depression: Secondary | ICD-10-CM | POA: Diagnosis not present

## 2024-07-13 DIAGNOSIS — E785 Hyperlipidemia, unspecified: Secondary | ICD-10-CM | POA: Diagnosis not present

## 2024-07-13 DIAGNOSIS — J449 Chronic obstructive pulmonary disease, unspecified: Secondary | ICD-10-CM | POA: Diagnosis not present

## 2024-07-13 DIAGNOSIS — Z0000 Encounter for general adult medical examination without abnormal findings: Secondary | ICD-10-CM | POA: Diagnosis not present

## 2024-07-13 DIAGNOSIS — M519 Unspecified thoracic, thoracolumbar and lumbosacral intervertebral disc disorder: Secondary | ICD-10-CM | POA: Diagnosis not present

## 2024-07-13 DIAGNOSIS — E119 Type 2 diabetes mellitus without complications: Secondary | ICD-10-CM | POA: Diagnosis not present

## 2024-07-13 DIAGNOSIS — K219 Gastro-esophageal reflux disease without esophagitis: Secondary | ICD-10-CM | POA: Diagnosis not present

## 2024-07-13 DIAGNOSIS — J309 Allergic rhinitis, unspecified: Secondary | ICD-10-CM | POA: Diagnosis not present

## 2024-07-14 ENCOUNTER — Other Ambulatory Visit: Payer: Self-pay

## 2024-07-20 DIAGNOSIS — E785 Hyperlipidemia, unspecified: Secondary | ICD-10-CM | POA: Diagnosis not present

## 2024-07-20 DIAGNOSIS — E119 Type 2 diabetes mellitus without complications: Secondary | ICD-10-CM | POA: Diagnosis not present

## 2024-08-13 DIAGNOSIS — Z79899 Other long term (current) drug therapy: Secondary | ICD-10-CM | POA: Diagnosis not present

## 2024-08-13 DIAGNOSIS — N200 Calculus of kidney: Secondary | ICD-10-CM | POA: Diagnosis not present

## 2024-08-13 DIAGNOSIS — G8929 Other chronic pain: Secondary | ICD-10-CM | POA: Diagnosis not present

## 2024-08-13 DIAGNOSIS — M25562 Pain in left knee: Secondary | ICD-10-CM | POA: Diagnosis not present

## 2024-08-13 DIAGNOSIS — R269 Unspecified abnormalities of gait and mobility: Secondary | ICD-10-CM | POA: Diagnosis not present

## 2024-08-13 DIAGNOSIS — M961 Postlaminectomy syndrome, not elsewhere classified: Secondary | ICD-10-CM | POA: Diagnosis not present

## 2024-08-13 DIAGNOSIS — M5416 Radiculopathy, lumbar region: Secondary | ICD-10-CM | POA: Diagnosis not present

## 2024-09-15 ENCOUNTER — Encounter: Payer: Self-pay | Admitting: Psychiatry

## 2024-09-15 ENCOUNTER — Telehealth: Admitting: Psychiatry

## 2024-09-15 DIAGNOSIS — F411 Generalized anxiety disorder: Secondary | ICD-10-CM

## 2024-09-15 DIAGNOSIS — G4701 Insomnia due to medical condition: Secondary | ICD-10-CM

## 2024-09-15 DIAGNOSIS — M549 Dorsalgia, unspecified: Secondary | ICD-10-CM | POA: Diagnosis not present

## 2024-09-15 DIAGNOSIS — F3341 Major depressive disorder, recurrent, in partial remission: Secondary | ICD-10-CM | POA: Diagnosis not present

## 2024-09-15 NOTE — Progress Notes (Signed)
 Virtual Visit via Video Note  I connected with Aaron Nichols on 09/15/24 at 11:00 AM EST by a video enabled telemedicine application and verified that I am speaking with the correct person using two identifiers.  Location Provider Location : [facility] Patient Location : Home  Participants: Patient , Provider   I discussed the limitations of evaluation and management by telemedicine and the availability of in person appointments. The patient expressed understanding and agreed to proceed.   I discussed the assessment and treatment plan with the patient. The patient was provided an opportunity to ask questions and all were answered. The patient agreed with the plan and demonstrated an understanding of the instructions.   The patient was advised to call back or seek an in-person evaluation if the symptoms worsen or if the condition fails to improve as anticipated.   BH MD OP Progress Note  09/15/2024 11:17 AM Aaron Nichols  MRN:  [id number]  Chief Complaint:  Chief Complaint  Patient presents with   Medication Refill   Follow-up   Anxiety   Depression   Discussed the use of AI scribe software for clinical note transcription with the patient, who gave verbal consent to proceed.  History of Present Illness Aaron Nichols is a 53 year old Caucasian male, currently on disability, married, lives in [place], has a history of MDD, GAD, insomnia, COPD was evaluated by telemedicine today.  Ongoing anxiety symptoms persist, and he reports that the current medication regimen, including sertraline  150 mg daily and buspirone  20 mg twice daily, has helped reduce feelings of hopelessness and low mood. He notes that he is not as down as he expected and believes the medications are beneficial for his mood and anxiety.  Sleep difficulties fluctuate, and he describes some improvement after increasing trazodone  to 100 mg at bedtime. He reports that recent worsening of sleep relates to increased back pain over the  past 4 to 5 days, which has made it difficult to get comfortable lying down or sitting. He reports that he will have a home sleep study on January 9 and expresses a desire to return to a more regular sleep cycle.  He denies any thoughts of hurting himself or others.  He currently lives at home and recently engaged in home improvement activities, including installing a shower.     Visit Diagnosis:    ICD-10-CM   1. Recurrent major depressive disorder, in partial remission  F33.41     2. GAD (generalized anxiety disorder)  F41.1     3. Insomnia due to medical condition  G47.01    pain, mood      Past Psychiatric History: I have reviewed past psychiatric history from progress note on 10/31/2022.  Past trials of medications like duloxetine.  Past Medical History:  Past Medical History:  Diagnosis Date   Abnormal LFTs (liver function tests)    Arthritis    BP (high blood pressure) 01/26/2015   Brain tumor (HCC)    acoustic neuroma   Complication of anesthesia 10/22/2006   brain tumor 15 hours surgery resulting nausea and vomiting for severeal days   Complication of anesthesia    pt. woke up before extubation with cystoscopy- pt not aware of this   GERD (gastroesophageal reflux disease)    Hydronephrosis with urinary obstruction due to ureteral calculus 07/03/2015   Hypertension    Infection of the upper respiratory tract 04/22/2015   Kidney stone 10/22/1988   PONV (postoperative nausea and vomiting)    Right ureteral  stone 07/03/2015   Right-sided Bell's palsy 10/22/2006   nerve fatigue    Past Surgical History:  Procedure Laterality Date   BRAIN TUMOR EXCISION  2008   CHOLECYSTECTOMY  2022   CYSTOSCOPY W/ RETROGRADES Left 09/07/2015   Procedure: CYSTOSCOPY WITH RETROGRADE PYELOGRAM;  Surgeon: Redell Lynwood Napoleon, MD;  Location: [facility];  Service: Urology;  Laterality: Left;   CYSTOSCOPY W/ RETROGRADES Left 10/03/2015   Procedure: CYSTOSCOPY WITH RETROGRADE PYELOGRAM;   Surgeon: Redell Lynwood Napoleon, MD;  Location: [facility];  Service: Urology;  Laterality: Left;   CYSTOSCOPY WITH STENT PLACEMENT Right 07/06/2015   Procedure: CYSTOSCOPY WITH STENT PLACEMENT;  Surgeon: Charlie JONETTA Pack, MD;  Location: [facility];  Service: Urology;  Laterality: Right;   CYSTOSCOPY WITH STENT PLACEMENT Left 09/07/2015   Procedure: CYSTOSCOPY WITH STENT PLACEMENT;  Surgeon: Redell Lynwood Napoleon, MD;  Location: [facility];  Service: Urology;  Laterality: Left;   CYSTOSCOPY WITH STENT PLACEMENT Left 10/03/2015   Procedure: CYSTOSCOPY WITH STENT PLACEMENT/ EXCHANGE/STONE BASKETING;  Surgeon: Redell Lynwood Napoleon, MD;  Location: [facility];  Service: Urology;  Laterality: Left;   ELBOW FRACTURE SURGERY Left 10/22/1996   INCISION AND DRAINAGE ABSCESS  2022   pt states this abscess was between his scrotum and anus   LUMBAR LAMINECTOMY/DECOMPRESSION MICRODISCECTOMY N/A 01/11/2022   Procedure: Microlumbar decompression Lumbar three-four;  Surgeon: Duwayne Purchase, MD;  Location: [facility] OR;  Service: Orthopedics;  Laterality: N/A;   URETEROSCOPY WITH HOLMIUM LASER LITHOTRIPSY Right 07/06/2015   Procedure: URETEROSCOPY ;  Surgeon: Charlie JONETTA Pack, MD;  Location: [facility];  Service: Urology;  Laterality: Right;   URETEROSCOPY WITH HOLMIUM LASER LITHOTRIPSY Right 07/25/2015   Procedure: URETEROSCOPY WITH HOLMIUM LASER LITHOTRIPSY, CYSTOSCOPY,  STENT REMOVAL ;  Surgeon: Charlie JONETTA Pack, MD;  Location: [facility];  Service: Urology;  Laterality: Right;   URETEROSCOPY WITH HOLMIUM LASER LITHOTRIPSY Left 09/07/2015   Procedure: URETEROSCOPY;  Surgeon: Redell Lynwood Napoleon, MD;  Location: [facility];  Service: Urology;  Laterality: Left;   URETEROSCOPY WITH HOLMIUM LASER LITHOTRIPSY Left 10/03/2015   Procedure: URETEROSCOPY WITH HOLMIUM LASER LITHOTRIPSY;  Surgeon: Redell Lynwood Napoleon, MD;  Location: [facility];  Service: Urology;  Laterality: Left;    Family Psychiatric History: I have reviewed family psychiatric history from  progress note on 10/31/2022.  Family History:  Family History  Problem Relation Age of Onset   Schizophrenia Mother    Alcohol abuse Father    Heart disease Father    Drug abuse Daughter     Social History: I have reviewed social history from progress note on 10/31/2022. Social History   Socioeconomic History   Marital status: Married    Spouse name: Not on file   Number of children: 1   Years of education: Not on file   Highest education level: Some college, no degree  Occupational History   Not on file  Tobacco Use   Smoking status: Former    Current packs/day: 0.00    Types: Cigarettes    Quit date: 10/13/2021    Years since quitting: 2.9   Smokeless tobacco: Never  Vaping Use   Vaping status: Never Used  Substance and Sexual Activity   Alcohol use: No   Drug use: No   Sexual activity: Not Currently  Other Topics Concern   Not on file  Social History Narrative   Not on file   Social Drivers of Health   Financial Resource Strain: Medium Risk (07/13/2024)   [facility]   Overall Financial Resource Strain (CARDIA)    Difficulty of Paying Living Expenses: Somewhat hard  Food Insecurity: Food Insecurity Present (07/13/2024)   Received from [facility]   Hunger Vital Sign    Within the past 12 months, you worried that your food would run out before you got the money to buy more.: Never true    Within the past 12 months, the food you bought just didn't last and you didn't have money to get more.: Sometimes true  Transportation Needs: No Transportation Needs (07/13/2024)   Received from [facility] - Transportation    In the past 12 months, has lack of transportation kept you from medical appointments or from getting medications?: No    Lack of Transportation (Non-Medical): No  Physical Activity: Not on file  Stress: Not on file  Social Connections: Not on file    Allergies:  Allergies   Allergen Reactions   Ceftin  [Cefuroxime  Axetil]     Dizziness,lightheaded   Prednisone Other (See Comments)    Metabolic Disorder Labs: No results found for: HGBA1C, MPG No results found for: PROLACTIN No results found for: CHOL, TRIG, HDL, CHOLHDL, VLDL, LDLCALC Lab Results  Component Value Date   TSH 1.925 11/26/2022    Therapeutic Level Labs: No results found for: LITHIUM No results found for: VALPROATE No results found for: CBMZ  Current Medications: Current Outpatient Medications  Medication Sig Dispense Refill   amLODipine  (NORVASC ) 5 MG tablet Take 5 mg by mouth daily.     atorvastatin (LIPITOR) 20 MG tablet Take 20 mg by mouth daily.     BREZTRI AEROSPHERE 160-9-4.8 MCG/ACT AERO inhaler Inhale into the lungs once.     busPIRone  (BUSPAR ) 10 MG tablet Take 2 tablets (20 mg total) by mouth 2 (two) times daily. 360 tablet 1   levocetirizine (XYZAL) 5 MG tablet Take 5 mg by mouth every evening.     lisinopril  (ZESTRIL ) 10 MG tablet Take 10 mg by mouth daily.     omeprazole (PRILOSEC) 20 MG capsule Take 20 mg by mouth daily.     oxyCODONE  (OXY IR/ROXICODONE ) 5 MG immediate release tablet Takes 5 mg twice daily (Patient taking differently: Take 5 mg by mouth. Takes 5 mg 4 times daily)     polyethylene glycol (MIRALAX  / GLYCOLAX ) 17 g packet Take 17 g by mouth daily. 14 each 0   propranolol  (INDERAL ) 10 MG tablet Take 1 tablet (10 mg total) by mouth 2 (two) times daily as needed. 60 tablet 2   sertraline  (ZOLOFT ) 100 MG tablet Take 1.5 tablets (150 mg total) by mouth daily. 135 tablet 1   traZODone  (DESYREL ) 100 MG tablet Take 1 tablet (100 mg total) by mouth at bedtime. 90 tablet 1   No current facility-administered medications for this visit.     Musculoskeletal: Strength & Muscle Tone: UTA Gait & Station: Seated Patient leans: N/A  Psychiatric Specialty Exam: Review of Systems  Psychiatric/Behavioral:  Positive for dysphoric mood and sleep  disturbance. The patient is nervous/anxious.     There were no vitals taken for this visit.There is no height or weight on file to calculate BMI.  General Appearance: Casual  Eye Contact:  Fair  Speech:  Normal Rate  Volume:  Normal  Mood:  Anxious and Depressed  Affect:  Congruent  Thought Process:  Goal Directed and Descriptions of Associations: Intact  Orientation:  Full (Time, Place, and Person)  Thought Content: Logical  Suicidal Thoughts:  No  Homicidal Thoughts:  No  Memory:  Immediate;   Fair Recent;   Fair Remote;   Fair  Judgement:  Fair  Insight:  Fair  Psychomotor Activity:  Normal  Concentration:  Concentration: Fair and Attention Span: Fair  Recall:  [employer] of Knowledge: Fair  Language: Fair  Akathisia:  No  Handed:  Left  AIMS (if indicated): not done  Assets:  Communication Skills Desire for Improvement Housing Social Support Talents/Skills  ADL's:  Intact  Cognition: WNL  Sleep:  Poor   Screenings: Geneticist, Molecular Office Visit from 04/22/2024 in [facility] Office Visit from 01/28/2024 in [facility] Office Visit from 06/19/2023 in [facility]  AIMS Total Score 0 0 0   GAD-7    Flowsheet Row Office Visit from 04/22/2024 in [facility] Office Visit from 01/28/2024 in [facility] Office Visit from 06/19/2023 in [facility] Counselor from 06/04/2023 in [facility] Counselor from 04/09/2023 in [facility]  Total GAD-7 Score 16 14 14 16 18    PHQ2-9    Flowsheet Row Office Visit from 04/22/2024 in [facility] Office Visit from 01/28/2024 in [facility] Office  Visit from 06/19/2023 in [facility] Counselor from 06/04/2023 in [facility] Counselor from 04/09/2023 in [facility]  PHQ-2 Total Score 6 5 6 6 5   PHQ-9 Total Score 23 20 15 21 21    Flowsheet Row Video Visit from 09/15/2024 in [facility] Video Visit from 06/30/2024 in [facility] Office Visit from 04/22/2024 in [facility]  C-SSRS RISK CATEGORY No Risk No Risk No Risk     Assessment and Plan: Aaron Nichols is a 53 year old Caucasian male who presented for a follow-up appointment, discussed assessment and plan as noted below.  1. Recurrent major depressive disorder, in partial remission Currently ongoing mood symptoms mostly related to worsening back pain.  Encouraged to get in touch with provider for management of the pain. Continue Sertraline  150 mg daily Continue BuSpar  20 mg twice daily.  2. GAD (generalized anxiety disorder)-improving Current anxiety mostly related to pain.  However reports current medication regimen is beneficial for management of anxiety otherwise. Continue Sertraline  as prescribed Continue BuSpar  20 mg twice daily Patient declines referral for CBT   3. Insomnia due to medical condition-unstable Recent exacerbation of back pain affects sleep. Awaiting sleep study, has upcoming appointment in January. Encouraged to have sufficient pain management Continue Trazodone  100 mg at bedtime as needed.  Follow-up Follow-up in clinic in 2 months or sooner in person.   Consent: Patient/Guardian gives verbal consent for treatment and assignment of benefits for services provided during this visit. Patient/Guardian expressed understanding and agreed to proceed.   This note was generated in part or whole with voice recognition software.  Voice recognition is usually quite accurate but there are transcription errors that can and very often do occur. I apologize for any typographical errors that were not detected and corrected.    Bellany Elbaum, MD 09/15/2024, 11:17 AM

## 2024-10-09 NOTE — Progress Notes (Signed)
 Aaron Nichols                                          MRN: [id number]   10/09/2024   The [facility] Quality Team Specialist reviewed this patient medical record for the purposes of chart review for care gap closure. The following were reviewed: abstraction for care gap closure-glycemic status assessment.    [facility]I Quality Team

## 2024-11-10 NOTE — Progress Notes (Signed)
 New Patient Visit    Chief Complaint: Chest pain, cardiac arrhythmia  Date of Service: 11/10/2024 Date of Birth: Feb 22, 1971 PCP: Jeffie Cheryl Therman Mickey., MD  History of Present Illness: Aaron Nichols is a 54 y.o.male patient who presented here for chest pain, cardiac arrhythmia.  Past medical history significant for hypertension, hyperlipidemia, obesity, tobacco abuse-continues to smoke 2 packs a day, back pain issues, family history of CAD-father dying of MI at the age of 59, paternal grandmother also died of MI.  Patient details that in last couple months he has intermittent chest discomfort, sometimes has aching/sharp discomfort in midsternal area and right sided chest for few minutes at rest.  Other times feel some tightness.  Has increased exertional dyspnea.  On recent sleep study, heart rate irregularity noted suggesting possible cardiac arrhythmia.  He has not no significant palpitations.  No syncope.  EKG today showed sinus rhythm without any ischemic changes.  Past Medical and Surgical History  Past Medical History Past Medical History:  Diagnosis Date   Abscess, perianal 2022   Requring hospitalization and a course of antibiotics for 3 months   Acoustic neuroma (CMS/HHS-HCC)    History of   Allergy 4 years ago   Anxiety    Arthritis 2008   Biliary colic 08/15/2021   Blisters of multiple sites    On back and body, which come and go   CSF leak 2007   Deafness in right ear    Depression    Elevated liver enzymes 2022   GERD (gastroesophageal reflux disease) 2003   H/O lithotripsy    History of brain tumor    Hyperlipidemia    Hypertension    Lumbar back pain    chronic   Motion sickness    Obesity    PONV (postoperative nausea and vomiting)    After 16 hour general brain/ear surgery only   Poor intravenous access    Renal stone 07/03/2015   Weakness on right side of face    After brain/ear surgery per patient    Past Surgical History He  has a past surgical history that includes other surgery; Fracture surgery (2000); I and D Perianal abscess (12/09/2020); anorectal exam (N/A, 04/26/2021); incision & drainage abscess perianal (N/A, 04/26/2021); colonoscopy w/biopsy (05/09/2021); Brain surgery (2007); and cholecystectomy laparoscopic w/common bile duct exploration (N/A, 08/31/2021).   Medications and Allergies  Current Medications Current Outpatient Medications  Medication Sig Dispense Refill   albuterol  MDI, PROVENTIL , VENTOLIN , PROAIR , HFA 90 mcg/actuation inhaler Inhale 2 inhalations into the lungs every 4 (four) hours as needed for Wheezing or Shortness of Breath for up to 90 days 18 g 3   aspirin/caffeine (BC PAIN RELIEF ORAL) Take by mouth 1-2 packets by mouth 2-3 times daily     budesonide-glycopyrrolate -formoterol (BREZTRI AEROSPHERE) 160-9-4.8 mcg/actuation inhaler Inhale 2 inhalations into the lungs 2 (two) times daily 10.7 g 12   busPIRone  (BUSPAR ) 10 MG tablet Take 10 mg by mouth 2 (two) times daily     lisinopriL  (ZESTRIL ) 10 MG tablet TAKE 1 TABLET(10 MG) BY MOUTH DAILY 90 tablet 1   omeprazole (PRILOSEC) 20 MG DR capsule Take 20 mg by mouth once daily     oxyCODONE  (ROXICODONE ) 5 MG immediate release tablet Take 5 mg by mouth 4 (four) times daily     sertraline  (ZOLOFT ) 100 MG tablet Take 150 mg by mouth once daily     traZODone  (DESYREL ) 100 MG tablet Take 100 mg by mouth at bedtime     amLODIPine  (  NORVASC ) 10 MG tablet Take 1 tablet (10 mg total) by mouth once daily 30 tablet 11   atorvastatin (LIPITOR) 40 MG tablet Take 1 tablet (40 mg total) by mouth once daily 30 tablet 11   metoprolol TARTrate (LOPRESSOR) 50 MG tablet Take 1 tablet (50 mg total) by mouth once for 1 dose 1 tablet 0   No current facility-administered medications for this visit.    Allergies Patient has no known allergies.  Social and Family History  Social History  reports that he has been smoking cigarettes. He started  smoking about 37 years ago. He has a 37.1 pack-year smoking history. He has never used smokeless tobacco. He reports that he does not drink alcohol and does not use drugs.  Family History family history includes Alcohol abuse in his father; Osteoarthritis in his mother.   Review of Systems   Review of Systems:  Atypical chest discomfort, exertional shortness of breath  Physical Examination   Vitals:BP (!) 176/80 (BP Location: Right upper arm, Patient Position: Sitting, BP Cuff Size: Large Adult)   Pulse 76   Resp 12   Ht 177.8 cm (5' 10)   Wt (!) 108.6 kg (239 lb 6.4 oz)   SpO2 96%   BMI 34.35 kg/m  Ht:177.8 cm (5' 10) Wt:(!) 108.6 kg (239 lb 6.4 oz) ADJ:Anib surface area is 2.32 meters squared. Body mass index is 34.35 kg/m.  HEENT: Pupils equally reactive to light and accomodation  Neck: Supple, no significant JVD Lungs: clear to auscultation bilaterally; no wheezes, rales, rhonchi Heart: Regular rate and rhythm. No murmur Extremities: no pedal edema  Assessment and Plan   54 y.o. male with Atypical chest pain Exertional dyspnea Hypertension Hyperlipidemia Tobacco abuse-continues to smoke 2 packs a day Family history of premature CAD Obesity, COPD  Patient with atypical chest pain and increased exertional dyspnea symptoms with multiple CAD risk factors as above. Will have coronary CTA to evaluate for any obstructive CAD. Will have echocardiogram to assess his cardiac/valvular function. Heart rate irregularity during sleep study without any significant palpitation.  EKG today showed sinus rhythm.  Will continue to monitor. Blood pressure elevated.  Will increase amlodipine  to 10 mg daily.  Continue lisinopril . Last lipid panel with elevated LDL.  Will increase atorvastatin to 40 mg daily. Counseled on smoking cessation Follow-up after above evaluation  Orders Placed This Encounter  Procedures   CT heart angiogram   ECG 12-lead   Echo complete    Return  in about 4 weeks (around 12/08/2024).  KRISHNA CHAITANYA ALLURI, MD  This dictation was prepared with dragon dictation. Any transcription errors that result from this process are unintentional.

## 2024-11-12 ENCOUNTER — Encounter: Payer: Self-pay | Admitting: Psychiatry

## 2024-11-12 ENCOUNTER — Telehealth: Admitting: Psychiatry

## 2024-11-12 DIAGNOSIS — F411 Generalized anxiety disorder: Secondary | ICD-10-CM

## 2024-11-12 DIAGNOSIS — F3342 Major depressive disorder, recurrent, in full remission: Secondary | ICD-10-CM | POA: Diagnosis not present

## 2024-11-12 DIAGNOSIS — G4701 Insomnia due to medical condition: Secondary | ICD-10-CM

## 2024-11-12 NOTE — Progress Notes (Unsigned)
 Virtual Visit via Video Note  I connected with Aaron Nichols on 11/12/24 at 11:30 AM EST by a video enabled telemedicine application and verified that I am speaking with the correct person using two identifiers. Location Provider Location : [facility] Patient Location : Home  Participants: Patient , Provider    I discussed the limitations of evaluation and management by telemedicine and the availability of in person appointments. The patient expressed understanding and agreed to proceed.  I discussed the assessment and treatment plan with the patient. The patient was provided an opportunity to ask questions and all were answered. The patient agreed with the plan and demonstrated an understanding of the instructions.   The patient was advised to call back or seek an in-person evaluation if the symptoms worsen or if the condition fails to improve as anticipated.   BH MD OP Progress Note  11/13/2024 7:21 AM [name]  MRN:  [id number]  Chief Complaint:  Chief Complaint  Patient presents with   Medication Refill   Follow-up   Anxiety   Depression   Insomnia   Discussed the use of AI scribe software for clinical note transcription with the patient, who gave verbal consent to proceed.  History of Present Illness Aaron Nichols is a 54 year old Caucasian male, currently on disability, married, lives in [place], has a history of MDD, GAD, insomnia, COPD was evaluated by telemedicine today.  He reports elevated anxiety, primarily related to thoughts about returning to work and uncertainty about his ability to work due to physical limitations.He states that his mood is generally good but acknowledges periods of feeling purposeless and lacking daily goals. He finds satisfaction in working with his hands by engaging in home improvement projects, such as completing a bathroom renovation over 6 months. He attempts to stay active with small projects around the house and plans to repair his deck when the  weather improves. He prefers hands-on work and finds it challenging to adjust to less physical activities due to his skill set and physical limitations.  He denies significant sadness in the past few days or weeks and states that any low mood remains manageable. He denies any thoughts of self-harm, suicidal ideation, or thoughts of harming others.  He reports variable appetite, with some days of reduced appetite and others with normal intake. He reports a weight loss of approximately 12 pounds over 6 months, with some fluctuation during this period.  He continues to experience ongoing sleep difficulties and states that trazodone  100 mg at bedtime, which previously helped, has become less effective. He recently underwent a sleep study and received a diagnosis of sleep apnea, with a CPAP device pending delivery. His current medications include Buspar  20 mg twice daily, sertraline  (Zoloft ) 150 mg daily (1.5 tablets of 100 mg), and trazodone  100 mg at bedtime.  He denies side effects to medications.  He is agreeable to referral to psychotherapist to pursue CBT.     Visit Diagnosis:    ICD-10-CM   1. Recurrent major depressive disorder, in full remission  F33.42     2. GAD (generalized anxiety disorder)  F41.1     3. Insomnia due to medical condition  G47.01 propranolol  (INDERAL ) 10 MG tablet   Obstructive sleep apnea currently awaiting CPAP, pain    4. Insomnia due to medical condition  G47.01 propranolol  (INDERAL ) 10 MG tablet   pain, mood      Past Psychiatric History: I have reviewed past psychiatric history from progress note on 10/31/2022.  Past trials of medications like duloxetine.  Past Medical History:  Past Medical History:  Diagnosis Date   Abnormal LFTs (liver function tests)    Arthritis    BP (high blood pressure) 01/26/2015   Brain tumor (HCC)    acoustic neuroma   Complication of anesthesia 10/22/2006   brain tumor 15 hours surgery resulting nausea and vomiting for  severeal days   Complication of anesthesia    pt. woke up before extubation with cystoscopy- pt not aware of this   GERD (gastroesophageal reflux disease)    Hydronephrosis with urinary obstruction due to ureteral calculus 07/03/2015   Hypertension    Infection of the upper respiratory tract 04/22/2015   Kidney stone 10/22/1988   PONV (postoperative nausea and vomiting)    Right ureteral stone 07/03/2015   Right-sided Bell's palsy 10/22/2006   nerve fatigue    Past Surgical History:  Procedure Laterality Date   BRAIN TUMOR EXCISION  2008   CHOLECYSTECTOMY  2022   CYSTOSCOPY W/ RETROGRADES Left 09/07/2015   Procedure: CYSTOSCOPY WITH RETROGRADE PYELOGRAM;  Surgeon: Redell Lynwood Napoleon, MD;  Location: [facility];  Service: Urology;  Laterality: Left;   CYSTOSCOPY W/ RETROGRADES Left 10/03/2015   Procedure: CYSTOSCOPY WITH RETROGRADE PYELOGRAM;  Surgeon: Redell Lynwood Napoleon, MD;  Location: [facility];  Service: Urology;  Laterality: Left;   CYSTOSCOPY WITH STENT PLACEMENT Right 07/06/2015   Procedure: CYSTOSCOPY WITH STENT PLACEMENT;  Surgeon: Charlie JONETTA Pack, MD;  Location: [facility];  Service: Urology;  Laterality: Right;   CYSTOSCOPY WITH STENT PLACEMENT Left 09/07/2015   Procedure: CYSTOSCOPY WITH STENT PLACEMENT;  Surgeon: Redell Lynwood Napoleon, MD;  Location: [facility];  Service: Urology;  Laterality: Left;   CYSTOSCOPY WITH STENT PLACEMENT Left 10/03/2015   Procedure: CYSTOSCOPY WITH STENT PLACEMENT/ EXCHANGE/STONE BASKETING;  Surgeon: Redell Lynwood Napoleon, MD;  Location: [facility];  Service: Urology;  Laterality: Left;   ELBOW FRACTURE SURGERY Left 10/22/1996   INCISION AND DRAINAGE ABSCESS  2022   pt states this abscess was between his scrotum and anus   LUMBAR LAMINECTOMY/DECOMPRESSION MICRODISCECTOMY N/A 01/11/2022   Procedure: Microlumbar decompression Lumbar three-four;  Surgeon: Duwayne Purchase, MD;  Location[facility];  Service: Orthopedics;  Laterality: N/A;   URETEROSCOPY WITH HOLMIUM LASER  LITHOTRIPSY Right 07/06/2015   Procedure: URETEROSCOPY ;  Surgeon: Charlie JONETTA Pack, MD;  Location: [facility];  Service: Urology;  Laterality: Right;   URETEROSCOPY WITH HOLMIUM LASER LITHOTRIPSY Right 07/25/2015   Procedure: URETEROSCOPY WITH HOLMIUM LASER LITHOTRIPSY, CYSTOSCOPY,  STENT REMOVAL ;  Surgeon: Charlie JONETTA Pack, MD;  Location: [facility];  Service: Urology;  Laterality: Right;   URETEROSCOPY WITH HOLMIUM LASER LITHOTRIPSY Left 09/07/2015   Procedure: URETEROSCOPY;  Surgeon: Redell Lynwood Napoleon, MD;  Location: [facility];  Service: Urology;  Laterality: Left;   URETEROSCOPY WITH HOLMIUM LASER LITHOTRIPSY Left 10/03/2015   Procedure: URETEROSCOPY WITH HOLMIUM LASER LITHOTRIPSY;  Surgeon: Redell Lynwood Napoleon, MD;  Location: [facility];  Service: Urology;  Laterality: Left;    Family Psychiatric History: Reviewed family psychiatric history from progress note on 10/31/2022.  Family History:  Family History  Problem Relation Age of Onset   Schizophrenia Mother    Alcohol abuse Father    Heart disease Father    Drug abuse Daughter     Social History: I have reviewed social history from progress note on 10/31/2022. Social History   Socioeconomic History   Marital status: Married    Spouse name: Not on file   Number of children:  1   Years of education: Not on file   Highest education level: Some college, no degree  Occupational History   Not on file  Tobacco Use   Smoking status: Former    Current packs/day: 0.00    Types: Cigarettes    Quit date: 10/13/2021    Years since quitting: 3.0   Smokeless tobacco: Never  Vaping Use   Vaping status: Never Used  Substance and Sexual Activity   Alcohol use: No   Drug use: No   Sexual activity: Not Currently  Other Topics Concern   Not on file  Social History Narrative   Not on file   Social Drivers of Health   Tobacco Use: Medium Risk (11/12/2024)   Patient History    Smoking Tobacco Use: Former    Smokeless Tobacco Use: Never     Passive Exposure: Not on file  Financial Resource Strain: Medium Risk (07/13/2024)   Received from [facility]   Overall Financial Resource Strain (CARDIA)    Difficulty of Paying Living Expenses: Somewhat hard  Food Insecurity: Food Insecurity Present (07/13/2024)   Received from [facility]   Epic    Within the past 12 months, you worried that your food would run out before you got the money to buy more.: Never true    Within the past 12 months, the food you bought just didn't last and you didn't have money to get more.: Sometimes true  Transportation Needs: No Transportation Needs (07/13/2024)   Received from [facility] - Transportation    In the past 12 months, has lack of transportation kept you from medical appointments or from getting medications?: No    Lack of Transportation (Non-Medical): No  Physical Activity: Not on file  Stress: Not on file  Social Connections: Not on file  Depression (PHQ2-9): Low Risk (11/12/2024)   Depression (PHQ2-9)    PHQ-2 Score: 1  Alcohol Screen: Not on file  Housing: Low Risk  (10/27/2024)   Received from [facility]   Epic    In the last 12 months, was there a time when you were not able to pay the mortgage or rent on time?: No    In the past 12 months, how many times have you moved where you were living?: 0    At any time in the past 12 months, were you homeless or living in a shelter (including now)?: No  Utilities: Not At Risk (07/13/2024)   Received from [facility]   Epic    In the past 12 months has the electric, gas, oil, or water company threatened to shut off services in your home?: No  Health Literacy: Not on file    Allergies: Allergies[1]  Metabolic Disorder Labs: No results found for: HGBA1C, MPG No results found for: PROLACTIN No results found for: CHOL, TRIG, HDL, CHOLHDL, VLDL, LDLCALC Lab Results  Component  Value Date   TSH 1.925 11/26/2022    Therapeutic Level Labs: No results found for: LITHIUM No results found for: VALPROATE No results found for: CBMZ  Current Medications: Current Outpatient Medications  Medication Sig Dispense Refill   amLODipine  (NORVASC ) 5 MG tablet Take 5 mg by mouth daily. (Patient taking differently: Take 10 mg by mouth daily.)     atorvastatin (LIPITOR) 20 MG tablet Take 20 mg by mouth daily. (Patient taking differently: Take 40 mg by mouth daily.)     BREZTRI AEROSPHERE 160-9-4.8  MCG/ACT AERO inhaler Inhale into the lungs once.     busPIRone  (BUSPAR ) 10 MG tablet Take 2 tablets (20 mg total) by mouth 2 (two) times daily. 360 tablet 1   levocetirizine (XYZAL) 5 MG tablet Take 5 mg by mouth every evening.     lisinopril  (ZESTRIL ) 10 MG tablet Take 10 mg by mouth daily.     omeprazole (PRILOSEC) 20 MG capsule Take 20 mg by mouth daily.     oxyCODONE  (OXY IR/ROXICODONE ) 5 MG immediate release tablet Takes 5 mg twice daily (Patient taking differently: Take 5 mg by mouth. Takes 5 mg 4 times daily)     polyethylene glycol (MIRALAX  / GLYCOLAX ) 17 g packet Take 17 g by mouth daily. 14 each 0   propranolol  (INDERAL ) 10 MG tablet Take 1 tablet (10 mg total) by mouth 2 (two) times daily as needed. Hold until cardiology clearance     sertraline  (ZOLOFT ) 100 MG tablet Take 1.5 tablets (150 mg total) by mouth daily. 135 tablet 1   traZODone  (DESYREL ) 100 MG tablet Take 1 tablet (100 mg total) by mouth at bedtime. 90 tablet 1   No current facility-administered medications for this visit.     Musculoskeletal: Strength & Muscle Tone: UTA Gait & Station: Seated Patient leans: N/A  Psychiatric Specialty Exam: Review of Systems  Psychiatric/Behavioral:  The patient is nervous/anxious.     There were no vitals taken for this visit.There is no height or weight on file to calculate BMI.  General Appearance: Casual  Eye Contact:  Fair  Speech:  Normal Rate  Volume:   Normal  Mood:  Anxious coping well  Affect:  Congruent  Thought Process:  Goal Directed and Descriptions of Associations: Intact  Orientation:  Full (Time, Place, and Person)  Thought Content: Logical   Suicidal Thoughts:  No  Homicidal Thoughts:  No  Memory:  Immediate;   Fair Recent;   Fair Remote;   Fair  Judgement:  Fair  Insight:  Fair  Psychomotor Activity:  Normal  Concentration:  Concentration: Fair and Attention Span: Fair  Recall:  [employer] of Knowledge: Fair  Language: Fair  Akathisia:  No  Handed:  Left  AIMS (if indicated): not done  Assets:  Communication Skills Desire for Improvement Housing Social Support Talents/Skills Transportation  ADL's:  Intact  Cognition: WNL  Sleep:  Poor   Screenings: Geneticist, Molecular Office Visit from 04/22/2024 in [facility] Office Visit from 01/28/2024 in [facility] Office Visit from 06/19/2023 in [facility]  AIMS Total Score 0 0 0   GAD-7    Flowsheet Row Office Visit from 04/22/2024 in [facility] Office Visit from 01/28/2024 in [facility] Office Visit from 06/19/2023 in [facility] Counselor from 06/04/2023 in [facility] Counselor from 04/09/2023 in [facility]  Total GAD-7 Score 16 14 14 16 18    PHQ2-9    Flowsheet Row Video Visit from 11/12/2024 in [facility] Office Visit from 04/22/2024 in [facility] Office Visit from 01/28/2024 in [facility] Office Visit from 06/19/2023 in [facility] Counselor from 06/04/2023 in [facility]  PHQ-2 Total Score 1 6 5 6 6   PHQ-9 Total  Score -- 23 20 15 21    Flowsheet Row Video Visit from 11/12/2024 in [facility] Video Visit from 09/15/2024 in [facility] Video Visit from 06/30/2024 in [facility]  C-SSRS RISK CATEGORY No Risk No Risk No Risk     Assessment and Plan: Aaron Nichols is a 54 year old Caucasian male who presented for a follow-up appointment, discussed assessment and plan as noted below.  1. Recurrent major depressive disorder, in full remission Currently denies any significant depression symptoms.  Good response to current medication regimen. Continue Sertraline  150 mg daily Continue BuSpar  20 mg twice daily  2. GAD (generalized anxiety disorder)-improving Although improving continues to have anxiety related to current situational stressors.  Will benefit from psychotherapy sessions. Refer patient to in-house therapist Continue Sertraline  as prescribed Continue BuSpar  20 mg twice daily Encouraged to hold propranolol  prescribed as needed for severe anxiety until cardiology clearance.  He rarely uses it.  3. Insomnia due to medical condition-unstable Recently diagnosed with sleep apnea pending CPAP Continue Trazodone  100 mg at bedtime as needed Continue sufficient pain management Patient encouraged to start using CPAP for sleep apnea   Follow-up Follow-up in clinic in 3 months or sooner in person.    Collaboration of Care: Collaboration of Care: Referral or follow-up with counselor/therapist AEB patient referred to start CBT communicated with staff to schedule this patient with in-house therapist  Patient/Guardian was advised Release of Information must be obtained prior to any record release in order to collaborate their care with an outside provider. Patient/Guardian was advised if they have not already done so to  contact the registration department to sign all necessary forms in order for us  to release information regarding their care.   Consent: Patient/Guardian gives verbal consent for treatment and assignment of benefits for services provided during this visit. Patient/Guardian expressed understanding and agreed to proceed.   This note was generated in part or whole with voice recognition software. Voice recognition is usually quite accurate but there are transcription errors that can and very often do occur. I apologize for any typographical errors that were not detected and corrected.    Breck Hollinger, MD 11/13/2024, 7:21 AM     [1]  Allergies Allergen Reactions   Ceftin  [Cefuroxime  Axetil]     Dizziness,lightheaded   Prednisone Other (See Comments)

## 2024-11-13 ENCOUNTER — Other Ambulatory Visit: Payer: Self-pay | Admitting: Cardiology

## 2024-11-13 DIAGNOSIS — E669 Obesity, unspecified: Secondary | ICD-10-CM

## 2024-11-13 DIAGNOSIS — R0789 Other chest pain: Secondary | ICD-10-CM

## 2024-11-13 DIAGNOSIS — Z720 Tobacco use: Secondary | ICD-10-CM

## 2024-11-13 DIAGNOSIS — R0609 Other forms of dyspnea: Secondary | ICD-10-CM

## 2024-11-13 DIAGNOSIS — I10 Essential (primary) hypertension: Secondary | ICD-10-CM

## 2024-11-13 DIAGNOSIS — Z8249 Family history of ischemic heart disease and other diseases of the circulatory system: Secondary | ICD-10-CM

## 2024-11-13 DIAGNOSIS — I498 Other specified cardiac arrhythmias: Secondary | ICD-10-CM

## 2024-11-13 MED ORDER — PROPRANOLOL HCL 10 MG PO TABS: 10.0000 mg | ORAL_TABLET | Freq: Two times a day (BID) | ORAL | Status: AC | PRN

## 2024-11-19 ENCOUNTER — Encounter (HOSPITAL_COMMUNITY): Payer: Self-pay

## 2024-11-20 ENCOUNTER — Telehealth (HOSPITAL_COMMUNITY): Payer: Self-pay | Admitting: *Deleted

## 2024-11-20 NOTE — Telephone Encounter (Signed)
 Attempted to call patient regarding upcoming cardiac CT appointment. Left message on voicemail with name and callback number  Larey Brick RN Navigator Cardiac Imaging [facility] [phone] Office [phone] Cell

## 2024-11-23 ENCOUNTER — Ambulatory Visit: Admission: RE | Admit: 2024-11-23 | Source: Ambulatory Visit

## 2024-12-03 ENCOUNTER — Ambulatory Visit: Admitting: Professional Counselor

## 2024-12-07 ENCOUNTER — Ambulatory Visit

## 2025-01-28 ENCOUNTER — Ambulatory Visit: Admitting: Psychiatry
